# Patient Record
Sex: Female | Born: 1990 | Race: White | Hispanic: No | Smoking: Former smoker
Health system: Southern US, Community
[De-identification: ages and names within clinical notes are randomized; demographics above are authoritative.]

## PROBLEM LIST (undated history)

## (undated) DIAGNOSIS — G4730 Sleep apnea, unspecified: Secondary | ICD-10-CM

## (undated) DIAGNOSIS — N809 Endometriosis, unspecified: Secondary | ICD-10-CM

## (undated) DIAGNOSIS — Q625 Duplication of ureter: Secondary | ICD-10-CM

## (undated) DIAGNOSIS — T8859XA Other complications of anesthesia, initial encounter: Secondary | ICD-10-CM

## (undated) DIAGNOSIS — M1990 Unspecified osteoarthritis, unspecified site: Secondary | ICD-10-CM

## (undated) DIAGNOSIS — G43909 Migraine, unspecified, not intractable, without status migrainosus: Secondary | ICD-10-CM

## (undated) DIAGNOSIS — N189 Chronic kidney disease, unspecified: Secondary | ICD-10-CM

## (undated) DIAGNOSIS — K219 Gastro-esophageal reflux disease without esophagitis: Secondary | ICD-10-CM

## (undated) DIAGNOSIS — J45909 Unspecified asthma, uncomplicated: Secondary | ICD-10-CM

## (undated) DIAGNOSIS — Z87442 Personal history of urinary calculi: Secondary | ICD-10-CM

## (undated) DIAGNOSIS — D649 Anemia, unspecified: Secondary | ICD-10-CM

## (undated) DIAGNOSIS — K589 Irritable bowel syndrome without diarrhea: Secondary | ICD-10-CM

## (undated) DIAGNOSIS — G8929 Other chronic pain: Secondary | ICD-10-CM

## (undated) DIAGNOSIS — G40909 Epilepsy, unspecified, not intractable, without status epilepticus: Secondary | ICD-10-CM

## (undated) DIAGNOSIS — R7989 Other specified abnormal findings of blood chemistry: Secondary | ICD-10-CM

## (undated) DIAGNOSIS — Z9889 Other specified postprocedural states: Secondary | ICD-10-CM

## (undated) DIAGNOSIS — F32A Depression, unspecified: Secondary | ICD-10-CM

## (undated) DIAGNOSIS — R569 Unspecified convulsions: Secondary | ICD-10-CM

## (undated) DIAGNOSIS — F419 Anxiety disorder, unspecified: Secondary | ICD-10-CM

## (undated) HISTORY — PX: APPENDECTOMY: SHX54

## (undated) HISTORY — PX: COLONOSCOPY WITH ESOPHAGOGASTRODUODENOSCOPY (EGD): SHX5779

## (undated) HISTORY — PX: LAPAROSCOPIC ENDOMETRIOSIS FULGURATION: SUR769

## (undated) HISTORY — DX: Endometriosis, unspecified: N80.9

## (undated) HISTORY — DX: Duplication of ureter: Q62.5

## (undated) HISTORY — PX: HYSTEROSCOPY: SHX211

## (undated) HISTORY — DX: Other chronic pain: G89.29

## (undated) HISTORY — PX: TOTAL LAPAROSCOPIC HYSTERECTOMY WITH BILATERAL SALPINGO OOPHORECTOMY: SHX6845

---

## 2014-06-01 DIAGNOSIS — R569 Unspecified convulsions: Secondary | ICD-10-CM

## 2014-06-01 HISTORY — DX: Unspecified convulsions: R56.9

## 2014-11-07 ENCOUNTER — Encounter: Payer: Self-pay | Admitting: *Deleted

## 2014-11-07 ENCOUNTER — Emergency Department: Payer: Medicare HMO

## 2014-11-07 ENCOUNTER — Emergency Department
Admission: EM | Admit: 2014-11-07 | Discharge: 2014-11-07 | Disposition: A | Discharge status: Home / Self Care | Payer: Medicare HMO | Attending: Emergency Medicine | Admitting: Emergency Medicine

## 2014-11-07 DIAGNOSIS — N939 Abnormal uterine and vaginal bleeding, unspecified: Secondary | ICD-10-CM | POA: Diagnosis present

## 2014-11-07 DIAGNOSIS — R197 Diarrhea, unspecified: Secondary | ICD-10-CM | POA: Diagnosis not present

## 2014-11-07 DIAGNOSIS — Z3202 Encounter for pregnancy test, result negative: Secondary | ICD-10-CM | POA: Diagnosis not present

## 2014-11-07 DIAGNOSIS — Z880 Allergy status to penicillin: Secondary | ICD-10-CM | POA: Insufficient documentation

## 2014-11-07 DIAGNOSIS — R1032 Left lower quadrant pain: Secondary | ICD-10-CM

## 2014-11-07 DIAGNOSIS — N946 Dysmenorrhea, unspecified: Secondary | ICD-10-CM | POA: Diagnosis not present

## 2014-11-07 DIAGNOSIS — R1031 Right lower quadrant pain: Secondary | ICD-10-CM

## 2014-11-07 HISTORY — DX: Epilepsy, unspecified, not intractable, without status epilepticus: G40.909

## 2014-11-07 LAB — COMPREHENSIVE METABOLIC PANEL
ALK PHOS: 30 U/L — AB (ref 38–126)
ALT: 14 U/L (ref 14–54)
AST: 15 U/L (ref 15–41)
Albumin: 4.2 g/dL (ref 3.5–5.0)
Anion gap: 6 (ref 5–15)
BUN: 13 mg/dL (ref 6–20)
CALCIUM: 9.2 mg/dL (ref 8.9–10.3)
CO2: 27 mmol/L (ref 22–32)
Chloride: 107 mmol/L (ref 101–111)
Creatinine, Ser: 0.64 mg/dL (ref 0.44–1.00)
GFR calc Af Amer: >60 mL/min (ref >60)
GFR calc non Af Amer: >60 mL/min (ref >60)
Glucose, Bld: 97 mg/dL (ref 65–99)
POTASSIUM: 4.2 mmol/L (ref 3.5–5.1)
Sodium: 140 mmol/L (ref 135–145)
Total Bilirubin: 0.2 mg/dL — ABNORMAL LOW (ref 0.3–1.2)
Total Protein: 6.9 g/dL (ref 6.5–8.1)

## 2014-11-07 LAB — WET PREP, GENITAL
Clue Cells Wet Prep HPF POC: NONE SEEN
TRICH WET PREP: NONE SEEN
Yeast Wet Prep HPF POC: NONE SEEN

## 2014-11-07 LAB — CBC WITH DIFFERENTIAL/PLATELET
BASOS ABS: 0.1 10*3/uL (ref 0–0.1)
Basophils Relative: 1 %
Eosinophils Absolute: 0.1 10*3/uL (ref 0–0.7)
Eosinophils Relative: 1 %
HCT: 40.3 % (ref 35.0–47.0)
Hemoglobin: 13.4 g/dL (ref 12.0–16.0)
Lymphocytes Relative: 36 %
Lymphs Abs: 2.2 10*3/uL (ref 1.0–3.6)
MCH: 28.6 pg (ref 26.0–34.0)
MCHC: 33.2 g/dL (ref 32.0–36.0)
MCV: 86.1 fL (ref 80.0–100.0)
Monocytes Absolute: 0.4 10*3/uL (ref 0.2–0.9)
Monocytes Relative: 7 %
NEUTROS ABS: 3.4 10*3/uL (ref 1.4–6.5)
NEUTROS PCT: 55 %
Platelets: 220 10*3/uL (ref 150–440)
RBC: 4.68 MIL/uL (ref 3.80–5.20)
RDW: 13.4 % (ref 11.5–14.5)
WBC: 6.2 10*3/uL (ref 3.6–11.0)

## 2014-11-07 LAB — URINALYSIS COMPLETE WITH MICROSCOPIC (ARMC ONLY)
Bilirubin Urine: NEGATIVE
Glucose, UA: NEGATIVE mg/dL
Hgb urine dipstick: NEGATIVE
Ketones, ur: NEGATIVE mg/dL
Leukocytes, UA: NEGATIVE
NITRITE: NEGATIVE
PROTEIN: NEGATIVE mg/dL
RBC / HPF: NONE SEEN RBC/hpf (ref 0–5)
SPECIFIC GRAVITY, URINE: 1.019 (ref 1.005–1.030)
pH: 6 (ref 5.0–8.0)

## 2014-11-07 LAB — CHLAMYDIA/NGC RT PCR (ARMC ONLY)
Chlamydia Tr: NOT DETECTED
N GONORRHOEAE: NOT DETECTED

## 2014-11-07 LAB — PREGNANCY, URINE: Preg Test, Ur: NEGATIVE

## 2014-11-07 MED ORDER — HYDROCODONE-ACETAMINOPHEN 5-325 MG PO TABS
1.0000 | ORAL_TABLET | Freq: Once | ORAL | Status: AC
  Administered 2014-11-07: 1 via ORAL

## 2014-11-07 MED ORDER — KETOROLAC TROMETHAMINE 10 MG PO TABS
10.0000 mg | ORAL_TABLET | Freq: Once | ORAL | Status: AC
  Administered 2014-11-07: 10 mg via ORAL

## 2014-11-07 MED ORDER — KETOROLAC TROMETHAMINE 10 MG PO TABS
ORAL_TABLET | ORAL | Status: AC
  Administered 2014-11-07: 10 mg via ORAL
  Filled 2014-11-07: qty 1

## 2014-11-07 MED ORDER — HYDROCODONE-ACETAMINOPHEN 5-325 MG PO TABS
ORAL_TABLET | ORAL | Status: AC
  Administered 2014-11-07: 1 via ORAL
  Filled 2014-11-07: qty 1

## 2014-11-07 MED ORDER — KETOROLAC TROMETHAMINE 10 MG PO TABS: 10.0000 mg | ORAL_TABLET | Freq: Three times a day (TID) | ORAL | Status: DC | PRN

## 2014-11-07 NOTE — ED Notes (Signed)
Lord, MD at bedside. 

## 2014-11-07 NOTE — ED Provider Notes (Signed)
[facility] Emergency Department Provider Note   ____________________________________________  Time seen: 8 PM I have reviewed the triage vital signs and the triage nursing note.  HISTORY  Chief Complaint Vaginal Bleeding   Historian Patient and significant other  HPI Grace Norris is a 24 y.o. female please been having lower abdominal pain and cramping as well as dark vaginal bleeding since Saturday. She is currently taking a birth control patch and she is not due for her period until one week from now. The pain in the low abdomen is moderate. There is occasional sharp pains. She is asked really concerned about a rupturing cyst because her family member had emergency surgery for this. She has been working out recently, but doesn't feel like her pain is due to sore abscess.   Past Medical History  Diagnosis Date  . Epilepsy     There are no active problems to display for this patient.   History reviewed. No pertinent past surgical history.  Current Outpatient Rx  Name  Route  Sig  Dispense  Refill  . ketorolac (TORADOL) 10 MG tablet   Oral   Take 1 tablet (10 mg total) by mouth every 8 (eight) hours as needed for moderate pain.   15 tablet   0     Allergies Ibuprofen; Penicillins; and Oxycodone  No family history on file.  Social History History  Substance Use Topics  . Smoking status: Never Smoker   . Smokeless tobacco: Not on file  . Alcohol Use: Yes    Review of Systems  Constitutional: Negative for fever. Eyes: Negative for visual changes. ENT: Negative for sore throat. Cardiovascular: Negative for chest pain. Respiratory: Negative for shortness of breath. Gastrointestinal: Positive for diarrhea in the mornings for the past 3 days. Genitourinary: Negative for dysuria. Musculoskeletal: Negative for back pain. Skin: Negative for rash. Neurological: Negative for headaches, focal weakness or  numbness.  ____________________________________________   PHYSICAL EXAM:  VITAL SIGNS: ED Triage Vitals  Enc Vitals Group     BP 11/07/14 1945 126/95 mmHg     Pulse Rate 11/07/14 1945 72     Resp 11/07/14 1945 18     Temp 11/07/14 1945 98.1 F (36.7 C)     Temp Source 11/07/14 1945 Oral     SpO2 11/07/14 1945 100 %     Weight 11/07/14 1945 117 lb (53.071 kg)     Height 11/07/14 1945 5\' 4"  (1.626 m)     Head Cir --      Peak Flow --      Pain Score 11/07/14 1946 9     Pain Loc --      Pain Edu? --      Excl. in [place]? --      Constitutional: Alert and oriented. Well appearing and in no distress. Eyes: Conjunctivae are normal. PERRL. Normal extraocular movements. ENT   Head: Normocephalic and atraumatic.   Nose: No congestion/rhinnorhea.   Mouth/Throat: Mucous membranes are moist.   Neck: No stridor. Cardiovascular: Normal rate, regular rhythm.  No murmurs, rubs, or gallops. Respiratory: Normal respiratory effort without tachypnea nor retractions. Breath sounds are clear and equal bilaterally. No wheezes/rales/rhonchi. Gastrointestinal: Soft. No distention, no guarding, no rebound. Moderate tenderness in the suprapubic right and left lower quadrants. Mild tenderness diffusely and epigastrically.  Genitourinary/rectal: Nontender pelvic exam, with no cervicitis, mild vaginal discharge, tiny amount of dark vaginal blood, closed cervix Musculoskeletal: Nontender with normal range of motion in all extremities. No joint  effusions.  No lower extremity tenderness nor edema. Neurologic:  Normal speech and language. No gross focal neurologic deficits are appreciated. Skin:  Skin is warm, dry and intact. No rash noted. Psychiatric: Mood and affect are normal. Speech and behavior are normal. Patient exhibits appropriate insight and judgment.  ____________________________________________   EKG  None ____________________________________________  LABS (pertinent  positives/negatives)  Wet prep with few WBC but otherwise negative Your pregnancy test negative Metabolic panel within normal limits White blood cell count 6.2 and hemoglobin 13.4  Urinalysis rare bacteria 0-5 white blood cell count otherwise negative ____________________________________________  RADIOLOGY Radiologist results reviewed  Ultrasound transvaginal: Normal pelvic and transvaginal ultrasound __________________________________________  PROCEDURES  Procedure(s) performed: None Critical Care performed: None  ____________________________________________   ED COURSE / ASSESSMENT AND PLAN  Pertinent labs & imaging results that were available during my care of the patient were reviewed by me and considered in my medical decision making (see chart for details).  Patient is mostly concerned about vaginal bleeding and abdominal cramping and superpubic area. Her labs and evaluation are reassuring. There is no evidence of cervicitis or PID. She is having dysmenorrhea and irregular bleeding likely hormonal given the fact that she is having bleeding through her personal patch. I recommended her to follow up with OB/GYN.  I am not suspicious of an other medical or surgical abdominal emergency such as appendicitis clinically on exam.  Return depressions and discharge instructions were provided to patient and she is agreeable to this plan.    ___________________________________________   FINAL CLINICAL IMPRESSION(S) / ED DIAGNOSES   Final diagnoses:  Bilateral lower abdominal pain  Dysmenorrhea      Lisa Roca, MD 11/07/14 2303

## 2014-11-07 NOTE — ED Notes (Signed)
Pt reports dark red vaginal bleeding with clotting x 3 days with sharp lower abdominal pain

## 2014-11-07 NOTE — ED Notes (Signed)
Pelvic exam done.   speciman to lab

## 2014-11-07 NOTE — Discharge Instructions (Signed)
You were evaluated for irregular vaginal bleeding and abdominal cramping. Your evaluation and exam are reassuring here in the emergency department. Return to the emergency room for any new or worsening abdominal pain, fever, black or bloody stools, dizziness, passing out, vomiting, or any other symptoms concerning to you.  You are referred to follow-up with an OB/GYN doctor with regard to irregular vaginal bleeding despite being on birth control patch.   Abdominal Pain, Women Abdominal (stomach, pelvic, or belly) pain can be caused by many things. It is important to tell your doctor:  The location of the pain.  Does it come and go or is it present all the time?  Are there things that start the pain (eating certain foods, exercise)?  Are there other symptoms associated with the pain (fever, nausea, vomiting, diarrhea)? All of this is helpful to know when trying to find the cause of the pain. CAUSES   Stomach: virus or bacteria infection, or ulcer.  Intestine: appendicitis (inflamed appendix), regional ileitis (Crohn's disease), ulcerative colitis (inflamed colon), irritable bowel syndrome, diverticulitis (inflamed diverticulum of the colon), or cancer of the stomach or intestine.  Gallbladder disease or stones in the gallbladder.  Kidney disease, kidney stones, or infection.  Pancreas infection or cancer.  Fibromyalgia (pain disorder).  Diseases of the female organs:  Uterus: fibroid (non-cancerous) tumors or infection.  Fallopian tubes: infection or tubal pregnancy.  Ovary: cysts or tumors.  Pelvic adhesions (scar tissue).  Endometriosis (uterus lining tissue growing in the pelvis and on the pelvic organs).  Pelvic congestion syndrome (female organs filling up with blood just before the menstrual period).  Pain with the menstrual period.  Pain with ovulation (producing an egg).  Pain with an IUD (intrauterine device, birth control) in the uterus.  Cancer of the female  organs.  Functional pain (pain not caused by a disease, may improve without treatment).  Psychological pain.  Depression. DIAGNOSIS  Your doctor will decide the seriousness of your pain by doing an examination.  Blood tests.  X-rays.  Ultrasound.  CT scan (computed tomography, special type of X-ray).  MRI (magnetic resonance imaging).  Cultures, for infection.  Barium enema (dye inserted in the large intestine, to better view it with X-rays).  Colonoscopy (looking in intestine with a lighted tube).  Laparoscopy (minor surgery, looking in abdomen with a lighted tube).  Major abdominal exploratory surgery (looking in abdomen with a large incision). TREATMENT  The treatment will depend on the cause of the pain.   Many cases can be observed and treated at home.  Over-the-counter medicines recommended by your caregiver.  Prescription medicine.  Antibiotics, for infection.  Birth control pills, for painful periods or for ovulation pain.  Hormone treatment, for endometriosis.  Nerve blocking injections.  Physical therapy.  Antidepressants.  Counseling with a psychologist or psychiatrist.  Minor or major surgery. HOME CARE INSTRUCTIONS   Do not take laxatives, unless directed by your caregiver.  Take over-the-counter pain medicine only if ordered by your caregiver. Do not take aspirin because it can cause an upset stomach or bleeding.  Try a clear liquid diet (broth or water) as ordered by your caregiver. Slowly move to a bland diet, as tolerated, if the pain is related to the stomach or intestine.  Have a thermometer and take your temperature several times a day, and record it.  Bed rest and sleep, if it helps the pain.  Avoid sexual intercourse, if it causes pain.  Avoid stressful situations.  Keep your follow-up appointments  and tests, as your caregiver orders.  If the pain does not go away with medicine or surgery, you may  try:  Acupuncture.  Relaxation exercises (yoga, meditation).  Group therapy.  Counseling. SEEK MEDICAL CARE IF:   You notice certain foods cause stomach pain.  Your home care treatment is not helping your pain.  You need stronger pain medicine.  You want your IUD removed.  You feel faint or lightheaded.  You develop nausea and vomiting.  You develop a rash.  You are having side effects or an allergy to your medicine. SEEK IMMEDIATE MEDICAL CARE IF:   Your pain does not go away or gets worse.  You have a fever.  Your pain is felt only in portions of the abdomen. The right side could possibly be appendicitis. The left lower portion of the abdomen could be colitis or diverticulitis.  You are passing blood in your stools (bright red or black tarry stools, with or without vomiting).  You have blood in your urine.  You develop chills, with or without a fever.  You pass out. MAKE SURE YOU:  Dysmenorrhea Dysmenorrhea is pain during a menstrual period. You will have pain in the lower belly (abdomen). The pain is caused by the tightening (contracting) of the muscles of the uterus. The pain can be minor or severe. Headache, feeling sick to your stomach (nausea), throwing up (vomiting), or low back pain may occur with this condition. HOME CARE  Only take medicine as told by your doctor.  Place a heating pad or hot water bottle on your lower back or belly. Do not sleep with a heating pad.  Exercise may help lessen the pain.  Massage the lower back or belly.  Stop smoking.  Avoid alcohol and caffeine. GET HELP IF:   Your pain does not get better with medicine.  You have pain during sex.  Your pain gets worse while taking pain medicine.  Your period bleeding is heavier than normal.  You keep feeling sick to your stomach or keep throwing up. GET HELP RIGHT AWAY IF: You pass out (faint). Document Released: 08/14/2008 Document Revised: 05/23/2013 Document Reviewed:  11/03/2012 [facility] Patient Information 2015 [place], [place]. This information is not intended to replace advice given to you by your health care provider. Make sure you discuss any questions you have with your health care provider.   Understand these instructions.  Will watch your condition.  Will get help right away if you are not doing well or get worse. Document Released: 03/15/2007 Document Revised: 10/02/2013 Document Reviewed: 04/04/2009 [facility] Patient Information 2015 [place], [place]. This information is not intended to replace advice given to you by your health care provider. Make sure you discuss any questions you have with your health care provider.

## 2015-06-05 ENCOUNTER — Encounter: Payer: Self-pay | Admitting: Emergency Medicine

## 2015-06-05 ENCOUNTER — Emergency Department
Admission: EM | Admit: 2015-06-05 | Discharge: 2015-06-05 | Discharge status: Against Medical Advice | Payer: Medicare Other | Attending: Emergency Medicine | Admitting: Emergency Medicine

## 2015-06-05 DIAGNOSIS — R1010 Upper abdominal pain, unspecified: Secondary | ICD-10-CM | POA: Insufficient documentation

## 2015-06-05 DIAGNOSIS — R1110 Vomiting, unspecified: Secondary | ICD-10-CM | POA: Insufficient documentation

## 2015-06-05 LAB — CBC
HEMATOCRIT: 42.7 % (ref 35.0–47.0)
HEMOGLOBIN: 14.5 g/dL (ref 12.0–16.0)
MCH: 29 pg (ref 26.0–34.0)
MCHC: 33.9 g/dL (ref 32.0–36.0)
MCV: 85.6 fL (ref 80.0–100.0)
Platelets: 288 10*3/uL (ref 150–440)
RBC: 4.99 MIL/uL (ref 3.80–5.20)
RDW: 12.8 % (ref 11.5–14.5)
WBC: 6.1 10*3/uL (ref 3.6–11.0)

## 2015-06-05 LAB — URINALYSIS COMPLETE WITH MICROSCOPIC (ARMC ONLY)
Bilirubin Urine: NEGATIVE
Glucose, UA: NEGATIVE mg/dL
Ketones, ur: NEGATIVE mg/dL
LEUKOCYTES UA: NEGATIVE
NITRITE: NEGATIVE
PROTEIN: NEGATIVE mg/dL
RBC / HPF: NONE SEEN RBC/hpf (ref 0–5)
Specific Gravity, Urine: 1.008 (ref 1.005–1.030)
pH: 6 (ref 5.0–8.0)

## 2015-06-05 LAB — COMPREHENSIVE METABOLIC PANEL
ALT: 18 U/L (ref 14–54)
ANION GAP: 3 — AB (ref 5–15)
AST: 16 U/L (ref 15–41)
Albumin: 4.6 g/dL (ref 3.5–5.0)
Alkaline Phosphatase: 44 U/L (ref 38–126)
BILIRUBIN TOTAL: 0.8 mg/dL (ref 0.3–1.2)
BUN: 12 mg/dL (ref 6–20)
CHLORIDE: 106 mmol/L (ref 101–111)
CO2: 29 mmol/L (ref 22–32)
Calcium: 9.1 mg/dL (ref 8.9–10.3)
Creatinine, Ser: 0.6 mg/dL (ref 0.44–1.00)
GFR calc Af Amer: >60 mL/min (ref >60)
Glucose, Bld: 96 mg/dL (ref 65–99)
Potassium: 4 mmol/L (ref 3.5–5.1)
Sodium: 138 mmol/L (ref 135–145)
TOTAL PROTEIN: 7.8 g/dL (ref 6.5–8.1)

## 2015-06-05 LAB — LIPASE, BLOOD: LIPASE: 31 U/L (ref 11–51)

## 2015-06-05 NOTE — ED Notes (Signed)
Pt reports vomiting x2 days, states "too many to count"; pt also reports upper abdominal pain but states "I'm also on my period so that's probably why". Pt alert and oriented in triage room, no distress noted at this time.

## 2015-06-10 ENCOUNTER — Encounter: Payer: Self-pay | Admitting: *Deleted

## 2015-06-10 ENCOUNTER — Emergency Department
Admission: EM | Admit: 2015-06-10 | Discharge: 2015-06-10 | Disposition: A | Discharge status: Home / Self Care | Payer: Medicare Other | Attending: Emergency Medicine | Admitting: Emergency Medicine

## 2015-06-10 DIAGNOSIS — G43001 Migraine without aura, not intractable, with status migrainosus: Secondary | ICD-10-CM

## 2015-06-10 DIAGNOSIS — Z79899 Other long term (current) drug therapy: Secondary | ICD-10-CM | POA: Insufficient documentation

## 2015-06-10 DIAGNOSIS — Z880 Allergy status to penicillin: Secondary | ICD-10-CM | POA: Diagnosis not present

## 2015-06-10 DIAGNOSIS — G43909 Migraine, unspecified, not intractable, without status migrainosus: Secondary | ICD-10-CM | POA: Diagnosis present

## 2015-06-10 HISTORY — DX: Migraine, unspecified, not intractable, without status migrainosus: G43.909

## 2015-06-10 MED ORDER — PROMETHAZINE HCL 25 MG/ML IJ SOLN
25.0000 mg | Freq: Once | INTRAMUSCULAR | Status: AC
  Administered 2015-06-10: 25 mg via INTRAMUSCULAR
  Filled 2015-06-10: qty 1

## 2015-06-10 MED ORDER — KETOROLAC TROMETHAMINE 10 MG PO TABS: 10.0000 mg | ORAL_TABLET | Freq: Four times a day (QID) | ORAL | Status: DC | PRN

## 2015-06-10 MED ORDER — PROMETHAZINE HCL 12.5 MG PO TABS: 25.0000 mg | ORAL_TABLET | Freq: Three times a day (TID) | ORAL | Status: DC | PRN

## 2015-06-10 MED ORDER — DIPHENHYDRAMINE HCL 50 MG/ML IJ SOLN
50.0000 mg | Freq: Once | INTRAMUSCULAR | Status: AC
  Administered 2015-06-10: 50 mg via INTRAMUSCULAR
  Filled 2015-06-10: qty 1

## 2015-06-10 MED ORDER — KETOROLAC TROMETHAMINE 60 MG/2ML IM SOLN
60.0000 mg | Freq: Once | INTRAMUSCULAR | Status: AC
  Administered 2015-06-10: 60 mg via INTRAMUSCULAR
  Filled 2015-06-10: qty 2

## 2015-06-10 NOTE — ED Notes (Signed)
States she developed a headache at temporal area for 3 days   Usually takes excedrin. Denies any fever  N/v or photosensitivity

## 2015-06-10 NOTE — ED Provider Notes (Signed)
[facility] Emergency Department Provider Note ?  ? ____________________________________________ ? Time seen: 3:27 PM ? I have reviewed the triage vital signs and the nursing notes.  ________ HISTORY ? Chief Complaint Migraine     HPI  Grace Norris is a 25 y.o. female   who presents emergency department complaining of a migraine headache. Per the patient she has a history of migraines and seizures. She is currently taking Keppra for seizures but is tapering down on her daily dosing. She states that she normally takes Excedrin for her migraines but that this migraine has not responded to same. Patient states that pain is constant, severe. She denies any photophobia, she denies this being the worst headache of her life. She denies any nausea or vomiting. Patient states that she has had the migraine cocktail before with very good relief. ? ? ? Past Medical History  Diagnosis Date  . Epilepsy ([place])   . Migraines     There are no active problems to display for this patient.  ? History reviewed. No pertinent past surgical history. ? Current Outpatient Rx  Name  Route  Sig  Dispense  Refill  . levETIRAcetam (KEPPRA) 100 MG/ML solution   Oral   Take 500 mg by mouth 2 (two) times daily.         Marland Kitchen ketorolac (TORADOL) 10 MG tablet   Oral   Take 1 tablet (10 mg total) by mouth every 8 (eight) hours as needed for moderate pain.   15 tablet   0   . ketorolac (TORADOL) 10 MG tablet   Oral   Take 1 tablet (10 mg total) by mouth every 6 (six) hours as needed.   20 tablet   0   . promethazine (PHENERGAN) 12.5 MG tablet   Oral   Take 2 tablets (25 mg total) by mouth every 8 (eight) hours as needed (Headache).   30 tablet   0    ? Allergies Ibuprofen; Penicillins; and Oxycodone ? No family history on file. ? Social History Social History  Substance Use Topics  . Smoking status: Never Smoker   . Smokeless tobacco: None  . Alcohol Use: Yes    ? Review of Systems Constitutional: no fever. Eyes: no discharge ENT: no sore throat. Cardiovascular: no chest pain. Respiratory: no cough. No sob Gastrointestinal: denies abdominal pain, vomiting, diarrhea, and constipation Genitourinary: no dysuria. Negative for hematuria Musculoskeletal: Negative for back pain. Skin: Negative for rash. Neurological: Positive for headaches but negative for focal neurological deficits.  10-point ROS otherwise negative.  _______________ PHYSICAL EXAM: ? VITAL SIGNS:   ED Triage Vitals  Enc Vitals Group     BP 06/10/15 1219 151/77 mmHg     Pulse Rate 06/10/15 1219 96     Resp 06/10/15 1219 20     Temp 06/10/15 1219 98.2 F (36.8 C)     Temp Source 06/10/15 1219 Oral     SpO2 06/10/15 1219 99 %     Weight 06/10/15 1219 128 lb (58.06 kg)     Height 06/10/15 1219 5\' 4"  (1.626 m)     Head Cir --      Peak Flow --      Pain Score 06/10/15 1219 10     Pain Loc --      Pain Edu? --      Excl. in [place]? --    ?  Constitutional: Alert and oriented. Well appearing and in no distress. Eyes: Conjunctivae are normal.  EOMI. PERRLA. ENT      Head: Normocephalic and atraumatic.      Ears:       Nose: No congestion/rhinnorhea.      Mouth/Throat: Mucous membranes are moist.   Hematological/Lymphatic/Immunilogical: No cervical lymphadenopathy. Cardiovascular: Normal rate, regular rhythm. Normal S1 and S2. Respiratory: Normal respiratory effort without tachypnea nor retractions. Lungs CTAB. Gastrointestinal: Soft and nontender. No distention. There is no CVA tenderness. Genitourinary:  Musculoskeletal: Nontender with normal range of motion in all extremities.  Neurologic:  Normal speech and language. No gross focal neurologic deficits are appreciated. Cranial nerves II through XII grossly intact. Sensation is is in and equal all 4 extremities. Skin:  Skin is warm, dry and intact. No rash noted. Psychiatric: Mood and affect are normal. Speech and  behavior are normal. Patient exhibits appropriate insight and judgment.    LABS (all labs ordered are listed, but only abnormal results are displayed)  Labs Reviewed - No data to display  ___________ RADIOLOGY    _____________ PROCEDURES ? Procedure(s) performed:    Medications  diphenhydrAMINE (BENADRYL) injection 50 mg (not administered)  ketorolac (TORADOL) injection 60 mg (not administered)  promethazine (PHENERGAN) injection 25 mg (not administered)    ______________________________________________________ INITIAL IMPRESSION / ASSESSMENT AND PLAN / ED COURSE ? Pertinent labs & imaging results that were available during my care of the patient were reviewed by me and considered in my medical decision making (see chart for details).    Diagnosis is consistent with migraine headache. Patient does not have any changes from baseline migraines. Neuro exam is unremarkable. Patient is given Toradol, diphenhydramine, and Phenergan here in the emergency department with some relief of migraine. Patient will be discharged home with Toradol tablets, Phenergan tablets and instructed to take diphenhydramine at home for return of headache. Patient verbalizes understanding of same. She will follow up with primary care.     New Prescriptions   KETOROLAC (TORADOL) 10 MG TABLET    Take 1 tablet (10 mg total) by mouth every 6 (six) hours as needed.   PROMETHAZINE (PHENERGAN) 12.5 MG TABLET    Take 2 tablets (25 mg total) by mouth every 8 (eight) hours as needed (Headache).   ____________________________________________ FINAL CLINICAL IMPRESSION(S) / ED DIAGNOSES?  Final diagnoses:  Migraine without aura and with status migrainosus, not intractable            Darletta Moll, PA-C 06/10/15 [place], MD 06/10/15 [phone]

## 2015-06-10 NOTE — Discharge Instructions (Signed)
Recurrent Migraine Headache A migraine headache is an intense, throbbing pain on one or both sides of your head. Recurrent migraines keep coming back. A migraine can last for 30 minutes to several hours. CAUSES  The exact cause of a migraine headache is not always known. However, a migraine may be caused when nerves in the brain become irritated and release chemicals that cause inflammation. This causes pain. Certain things may also trigger migraines, such as:   Alcohol.  Smoking.  Stress.  Menstruation.  Aged cheeses.  Foods or drinks that contain nitrates, glutamate, aspartame, or tyramine.  Lack of sleep.  Chocolate.  Caffeine.  Hunger.  Physical exertion.  Fatigue.  Medicines used to treat chest pain (nitroglycerine), birth control pills, estrogen, and some blood pressure medicines. SYMPTOMS   Pain on one or both sides of your head.  Pulsating or throbbing pain.  Severe pain that prevents daily activities.  Pain that is aggravated by any physical activity.  Nausea, vomiting, or both.  Dizziness.  Pain with exposure to bright lights, loud noises, or activity.  General sensitivity to bright lights, loud noises, or smells. Before you get a migraine, you may get warning signs that a migraine is coming (aura). An aura may include:  Seeing flashing lights.  Seeing bright spots, halos, or zigzag lines.  Having tunnel vision or blurred vision.  Having feelings of numbness or tingling.  Having trouble talking.  Having muscle weakness. DIAGNOSIS  A recurrent migraine headache is often diagnosed based on:  Symptoms.  Physical examination.  A CT scan or MRI of your head. These imaging tests cannot diagnose migraines but can help rule out other causes of headaches.  TREATMENT  Medicines may be given for pain and nausea. Medicines can also be given to help prevent recurrent migraines. HOME CARE INSTRUCTIONS  Only take over-the-counter or prescription  medicines for pain or discomfort as directed by your health care provider. The use of long-term narcotics is not recommended.  Lie down in a dark, quiet room when you have a migraine.  Keep a journal to find out what may trigger your migraine headaches. For example, write down:  What you eat and drink.  How much sleep you get.  Any change to your diet or medicines.  Limit alcohol consumption.  Quit smoking if you smoke.  Get 7-9 hours of sleep, or as recommended by your health care provider.  Limit stress.  Keep lights dim if bright lights bother you and make your migraines worse. SEEK MEDICAL CARE IF:   You do not get relief from the medicines given to you.  You have a recurrence of pain.  You have a fever. SEEK IMMEDIATE MEDICAL CARE IF:  Your migraine becomes severe.  You have a stiff neck.  You have loss of vision.  You have muscular weakness or loss of muscle control.  You start losing your balance or have trouble walking.  You feel faint or pass out.  You have severe symptoms that are different from your first symptoms. MAKE SURE YOU:   Understand these instructions.  Will watch your condition.  Will get help right away if you are not doing well or get worse.   This information is not intended to replace advice given to you by your health care provider. Make sure you discuss any questions you have with your health care provider.   Document Released: 02/10/2001 Document Revised: 06/08/2014 Document Reviewed: 01/23/2013 Elsevier Interactive Patient Education 2016 [employer]  

## 2015-06-10 NOTE — ED Notes (Signed)
Patient states she has had a migraine for 3 days. Patient states she took and Excedrin and hot cloths without relief. Patient is calm, texting on phone in triage.

## 2015-06-25 ENCOUNTER — Emergency Department
Admission: EM | Admit: 2015-06-25 | Discharge: 2015-06-25 | Disposition: A | Discharge status: Home / Self Care | Payer: Medicare Other | Attending: Student | Admitting: Student

## 2015-06-25 ENCOUNTER — Encounter: Payer: Self-pay | Admitting: Emergency Medicine

## 2015-06-25 DIAGNOSIS — Z880 Allergy status to penicillin: Secondary | ICD-10-CM | POA: Insufficient documentation

## 2015-06-25 DIAGNOSIS — M25531 Pain in right wrist: Secondary | ICD-10-CM | POA: Diagnosis present

## 2015-06-25 DIAGNOSIS — Z79899 Other long term (current) drug therapy: Secondary | ICD-10-CM | POA: Diagnosis not present

## 2015-06-25 NOTE — ED Provider Notes (Signed)
[facility] Emergency Department Provider Note  ____________________________________________  Time seen: Approximately 7:54 PM  I have reviewed the triage vital signs and the nursing notes.   HISTORY  Chief Complaint Wrist Pain    HPI Grace Norris is a 25 y.o. female who presents emergency department complaining of right wrist pain. Patient states that she has not had any injury to wrist. She went to see her primary care provider and was told that she had a old fracture of her right wrist that had not healed properly. Her primary care provider told her that there was some malalignment of the bone. No new fractures. Patient was given splint and prescription for oral Toradol and was told to follow up with "someone who can put a cast on it."Patient presents emergency department seeking a cast for her wrist.   Past Medical History  Diagnosis Date  . Epilepsy ([place])   . Migraines     There are no active problems to display for this patient.   History reviewed. No pertinent past surgical history.  Current Outpatient Rx  Name  Route  Sig  Dispense  Refill  . ketorolac (TORADOL) 10 MG tablet   Oral   Take 1 tablet (10 mg total) by mouth every 8 (eight) hours as needed for moderate pain.   15 tablet   0   . ketorolac (TORADOL) 10 MG tablet   Oral   Take 1 tablet (10 mg total) by mouth every 6 (six) hours as needed.   20 tablet   0   . levETIRAcetam (KEPPRA) 100 MG/ML solution   Oral   Take 500 mg by mouth 2 (two) times daily.         . promethazine (PHENERGAN) 12.5 MG tablet   Oral   Take 2 tablets (25 mg total) by mouth every 8 (eight) hours as needed (Headache).   30 tablet   0     Allergies Ibuprofen; Penicillins; and Oxycodone  History reviewed. No pertinent family history.  Social History Social History  Substance Use Topics  . Smoking status: Never Smoker   . Smokeless tobacco: None  . Alcohol Use: Yes     Review of  Systems  Constitutional: No fever/chills Eyes: No visual changes. No discharge ENT: No sore throat. Cardiovascular: no chest pain. Respiratory: no cough. No SOB. Gastrointestinal: No abdominal pain.  No nausea, no vomiting.  No diarrhea.  No constipation. Genitourinary: Negative for dysuria. No hematuria Musculoskeletal: Negative for back pain. Right wrist pain. Skin: Negative for rash. Neurological: Negative for headaches, focal weakness or numbness. 10-point ROS otherwise negative.  ____________________________________________   PHYSICAL EXAM:  VITAL SIGNS: ED Triage Vitals  Enc Vitals Group     BP --      Pulse Rate 06/25/15 1935 86     Resp 06/25/15 1935 18     Temp 06/25/15 1935 97.9 F (36.6 C)     Temp src --      SpO2 06/25/15 1935 100 %     Weight --      Height --      Head Cir --      Peak Flow --      Pain Score --      Pain Loc --      Pain Edu? --      Excl. in [place]? --      Constitutional: Alert and oriented. Well appearing and in no acute distress. Eyes: Conjunctivae are normal. PERRL. EOMI.  Head: Atraumatic. ENT:      Ears:       Nose: No congestion/rhinnorhea.      Mouth/Throat: Mucous membranes are moist.  Neck: No stridor.   Hematological/Lymphatic/Immunilogical: No cervical lymphadenopathy. Cardiovascular: Normal rate, regular rhythm. Normal S1 and S2.  Good peripheral circulation. Respiratory: Normal respiratory effort without tachypnea or retractions. Lungs CTAB. Gastrointestinal: Soft and nontender. No distention. No CVA tenderness. Musculoskeletal: No lower extremity tenderness nor edema.  No joint effusions. Right wrist is in a Velcro wrist splint. This is removed. No visible deformity. Patient endorses tenderness to palpation over the distal radius and ulna. No palpable abnormality. Full range of motion to wrist. Radial pulse intact. Sensation intact 5 digits and equal to unaffected extremity. Neurologic:  Normal speech and language. No  gross focal neurologic deficits are appreciated.  Skin:  Skin is warm, dry and intact. No rash noted. Psychiatric: Mood and affect are normal. Speech and behavior are normal. Patient exhibits appropriate insight and judgement.   ____________________________________________   LABS (all labs ordered are listed, but only abnormal results are displayed)  Labs Reviewed - No data to display ____________________________________________  EKG   ____________________________________________  RADIOLOGY   No results found.  ____________________________________________    PROCEDURES  Procedure(s) performed:       Medications - No data to display   ____________________________________________   INITIAL IMPRESSION / ASSESSMENT AND PLAN / ED COURSE  Pertinent labs & imaging results that were available during my care of the patient were reviewed by me and considered in my medical decision making (see chart for details).  Patient's diagnosis is consistent with right wrist pain. Patient was seen by primary care provider and told that she needed to see someone to have a cast on her wrist. She denies any new fractures. She states that there is "malalignment from previous fracture." This fracture occurred 20 years prior. Patient is in a Velcro wrist splint from primary care provider and has Toradol tablets prescribed for her persistent control. Wrist is not re-x-rayed at this time. Patient is requesting sling and this is provided for her. She is to follow-up with orthopedics for further evaluation and treatment    ____________________________________________  FINAL CLINICAL IMPRESSION(S) / ED DIAGNOSES  Final diagnoses:  Right wrist pain      NEW MEDICATIONS STARTED DURING THIS VISIT:  New Prescriptions   No medications on file        Darletta Moll, PA-C 06/25/15 2003  Joanne Gavel, MD 06/25/15 [phone]

## 2015-06-25 NOTE — ED Notes (Signed)
Pt c/o right wrist pain, r/t fracture over 20 years ago. Pt reports going to PCP for xray, pt told old fracture never healed and had scar tissue, pt given splint and Toradol for pain management.

## 2015-06-25 NOTE — Discharge Instructions (Signed)
Wrist Pain There are many things that can cause wrist pain. Some common causes include:  An injury to the wrist area, such as a sprain, strain, or fracture.  Overuse of the joint.  A condition that causes increased pressure on a nerve in the wrist (carpal tunnel syndrome).  Wear and tear of the joints that occurs with aging (osteoarthritis).  A variety of other types of arthritis. Sometimes, the cause of wrist pain is not known. The pain often goes away when you follow your health care provider's instructions for relieving pain at home. If your wrist pain continues, tests may need to be done to diagnose your condition. HOME CARE INSTRUCTIONS Pay attention to any changes in your symptoms. Take these actions to help with your pain:  Rest the wrist area for at least 48 hours or as told by your health care provider.  If directed, apply ice to the injured area:  Put ice in a plastic bag.  Place a towel between your skin and the bag.  Leave the ice on for 20 minutes, 2-3 times per day.  Keep your arm raised (elevated) above the level of your heart while you are sitting or lying down.  If a splint or elastic bandage has been applied, use it as told by your health care provider.  Remove the splint or bandage only as told by your health care provider.  Loosen the splint or bandage if your fingers become numb or have a tingling feeling, or if they turn cold or blue.  Take over-the-counter and prescription medicines only as told by your health care provider.  Keep all follow-up visits as told by your health care provider. This is important. SEEK MEDICAL CARE IF:  Your pain is not helped by treatment.  Your pain gets worse. SEEK IMMEDIATE MEDICAL CARE IF:  Your fingers become swollen.  Your fingers turn white, very red, or cold and blue.  Your fingers are numb or have a tingling feeling.  You have difficulty moving your fingers.   This information is not intended to replace  advice given to you by your health care provider. Make sure you discuss any questions you have with your health care provider.   Document Released: 02/25/2005 Document Revised: 02/06/2015 Document Reviewed: 10/03/2014 Elsevier Interactive Patient Education 2016 [employer]  

## 2015-07-24 ENCOUNTER — Emergency Department
Admission: EM | Admit: 2015-07-24 | Discharge: 2015-07-24 | Disposition: A | Discharge status: Home / Self Care | Payer: Medicare Other | Attending: Emergency Medicine | Admitting: Emergency Medicine

## 2015-07-24 ENCOUNTER — Emergency Department: Payer: Medicare Other

## 2015-07-24 DIAGNOSIS — Z880 Allergy status to penicillin: Secondary | ICD-10-CM | POA: Diagnosis not present

## 2015-07-24 DIAGNOSIS — Z79899 Other long term (current) drug therapy: Secondary | ICD-10-CM | POA: Diagnosis not present

## 2015-07-24 DIAGNOSIS — R079 Chest pain, unspecified: Secondary | ICD-10-CM | POA: Diagnosis not present

## 2015-07-24 LAB — TROPONIN I: Troponin I: <0.03 ng/mL (ref <0.031)

## 2015-07-24 LAB — BASIC METABOLIC PANEL
ANION GAP: 8 (ref 5–15)
BUN: 15 mg/dL (ref 6–20)
CO2: 25 mmol/L (ref 22–32)
Calcium: 9.1 mg/dL (ref 8.9–10.3)
Chloride: 105 mmol/L (ref 101–111)
Creatinine, Ser: 0.5 mg/dL (ref 0.44–1.00)
GFR calc Af Amer: >60 mL/min (ref >60)
GLUCOSE: 90 mg/dL (ref 65–99)
POTASSIUM: 3.9 mmol/L (ref 3.5–5.1)
Sodium: 138 mmol/L (ref 135–145)

## 2015-07-24 LAB — CBC
HEMATOCRIT: 40.3 % (ref 35.0–47.0)
HEMOGLOBIN: 13.5 g/dL (ref 12.0–16.0)
MCH: 28.3 pg (ref 26.0–34.0)
MCHC: 33.5 g/dL (ref 32.0–36.0)
MCV: 84.4 fL (ref 80.0–100.0)
Platelets: 184 10*3/uL (ref 150–440)
RBC: 4.77 MIL/uL (ref 3.80–5.20)
RDW: 13.3 % (ref 11.5–14.5)
WBC: 5.1 10*3/uL (ref 3.6–11.0)

## 2015-07-24 MED ORDER — ACETAMINOPHEN 325 MG PO TABS
650.0000 mg | ORAL_TABLET | Freq: Once | ORAL | Status: AC
  Administered 2015-07-24: 650 mg via ORAL
  Filled 2015-07-24: qty 2

## 2015-07-24 NOTE — ED Provider Notes (Signed)
[facility] Emergency Department Provider Note    ____________________________________________  Time seen: ~1855  I have reviewed the triage vital signs and the nursing notes.   HISTORY  Chief Complaint Chest Pain   History limited by: Not Limited   HPI Grace Norris is a 25 y.o. female who presents to the emergency department today because of concerns for chest pain. The patient states that she started having chest pain this morning. It is located in the central chest. It is been sharp. It has been constant since roughly 10 AM. She states that it is worse with movement. She states that she was recently started treatment for the flu. States she was tested at a [facility] and put on Tamiflu. Patient denies any cough with the chest pain. Denies any shortness of breath although states that it is hard for her to take a deep breath without pain.     Past Medical History  Diagnosis Date  . Epilepsy ([place])   . Migraines     There are no active problems to display for this patient.   History reviewed. No pertinent past surgical history.  Current Outpatient Rx  Name  Route  Sig  Dispense  Refill  . ketorolac (TORADOL) 10 MG tablet   Oral   Take 1 tablet (10 mg total) by mouth every 8 (eight) hours as needed for moderate pain.   15 tablet   0   . ketorolac (TORADOL) 10 MG tablet   Oral   Take 1 tablet (10 mg total) by mouth every 6 (six) hours as needed.   20 tablet   0   . levETIRAcetam (KEPPRA) 100 MG/ML solution   Oral   Take 500 mg by mouth 2 (two) times daily.         . promethazine (PHENERGAN) 12.5 MG tablet   Oral   Take 2 tablets (25 mg total) by mouth every 8 (eight) hours as needed (Headache).   30 tablet   0     Allergies Ibuprofen; Penicillins; and Oxycodone  No family history on file.  Social History Social History  Substance Use Topics  . Smoking status: Never Smoker   . Smokeless tobacco: None  . Alcohol Use: Yes     Review of Systems  Constitutional: Negative for fever. Cardiovascular: Positive for chest pain. Respiratory: Negative for shortness of breath. Gastrointestinal: Negative for abdominal pain, vomiting and diarrhea. Neurological: Negative for headaches, focal weakness or numbness.   10-point ROS otherwise negative.  ____________________________________________   PHYSICAL EXAM:  VITAL SIGNS: ED Triage Vitals  Enc Vitals Group     BP 07/24/15 1703 137/75 mmHg     Pulse Rate 07/24/15 1703 95     Resp 07/24/15 1703 20     Temp 07/24/15 1703 98.3 F (36.8 C)     Temp Source 07/24/15 1703 Oral     SpO2 07/24/15 1703 100 %     Weight 07/24/15 1703 134 lb (60.782 kg)     Height 07/24/15 1703 5\' 4"  (1.626 m)     Head Cir --      Peak Flow --      Pain Score 07/24/15 1657 10   Constitutional: Alert and oriented. Well appearing and in no distress. Eyes: Conjunctivae are normal. PERRL. Normal extraocular movements. ENT   Head: Normocephalic and atraumatic.   Nose: No congestion/rhinnorhea.   Mouth/Throat: Mucous membranes are moist.   Neck: No stridor. Hematological/Lymphatic/Immunilogical: No cervical lymphadenopathy. Cardiovascular: Normal rate, regular  rhythm.  No murmurs, rubs, or gallops. Respiratory: Normal respiratory effort without tachypnea nor retractions. Breath sounds are clear and equal bilaterally. No wheezes/rales/rhonchi. Central chest is tender to palpation. Patient states this does reproduce the pain she has been experiencing. Gastrointestinal: Soft and nontender. No distention.  Genitourinary: Deferred Musculoskeletal: Normal range of motion in all extremities. No joint effusions.  No lower extremity tenderness nor edema. Neurologic:  Normal speech and language. No gross focal neurologic deficits are appreciated.  Skin:  Skin is warm, dry and intact. No rash noted. Psychiatric: Mood and affect are normal. Speech and behavior are normal. Patient  exhibits appropriate insight and judgment.  ____________________________________________    LABS (pertinent positives/negatives)  Labs Reviewed  BASIC METABOLIC PANEL  CBC  TROPONIN I     ____________________________________________   EKG  I, Nance Pear, attending physician, personally viewed and interpreted this EKG  EKG Time: 1700 Rate: 89 Rhythm: normal sinus rhythm Axis: normal Intervals: qtc 413 QRS: narrow ST changes: no st elevation Impression: abnormal ekg  ____________________________________________    RADIOLOGY  CXR IMPRESSION: No active cardiopulmonary disease.  ____________________________________________   PROCEDURES  Procedure(s) performed: None  Critical Care performed: No  ____________________________________________   INITIAL IMPRESSION / ASSESSMENT AND PLAN / ED COURSE  Pertinent labs & imaging results that were available during my care of the patient were reviewed by me and considered in my medical decision making (see chart for details).  Patient presents to the emergency department today because of chest pain. Patient is extremely tender to palpation across the sternum. Think likely patient suffering from costochondritis likely secondary to viral illness. Chest x-ray and blood work as well as EKG without any concerning findings. Advised patient to try Tylenol and ibuprofen.  ____________________________________________   FINAL CLINICAL IMPRESSION(S) / ED DIAGNOSES  Final diagnoses:  Chest pain, unspecified chest pain type     Nance Pear, MD 07/24/15 [id number]

## 2015-07-24 NOTE — Discharge Instructions (Signed)
Please seek medical attention for any high fevers, chest pain, shortness of breath, change in behavior, persistent vomiting, bloody stool or any other new or concerning symptoms.   Chest Wall Pain Chest wall pain is pain in or around the bones and muscles of your chest. Sometimes, an injury causes this pain. Sometimes, the cause may not be known. This pain may take several weeks or longer to get better. HOME CARE Pay attention to any changes in your symptoms. Take these actions to help with your pain:  Rest as told by your doctor.  Avoid activities that cause pain. Try not to use your chest, belly (abdominal), or side muscles to lift heavy things.  If directed, apply ice to the painful area:  Put ice in a plastic bag.  Place a towel between your skin and the bag.  Leave the ice on for 20 minutes, 2-3 times per day.  Take over-the-counter and prescription medicines only as told by your doctor.  Do not use tobacco products, including cigarettes, chewing tobacco, and e-cigarettes. If you need help quitting, ask your doctor.  Keep all follow-up visits as told by your doctor. This is important. GET HELP IF:  You have a fever.  Your chest pain gets worse.  You have new symptoms. GET HELP RIGHT AWAY IF:  You feel sick to your stomach (nauseous) or you throw up (vomit).  You feel sweaty or light-headed.  You have a cough with phlegm (sputum) or you cough up blood.  You are short of breath.   This information is not intended to replace advice given to you by your health care provider. Make sure you discuss any questions you have with your health care provider.   Document Released: 11/04/2007 Document Revised: 02/06/2015 Document Reviewed: 08/13/2014 Elsevier Interactive Patient Education [employer].

## 2015-07-24 NOTE — ED Notes (Signed)
Pt arrives to Er via POV c/o chest pain that awoke her at 330AM today. Pt dx with flu X3 days ago, pt taking Tamiflu. Pt alert and oriented X4, active, cooperative, pt in NAD. RR even and unlabored, color WNL.

## 2015-08-24 ENCOUNTER — Emergency Department: Payer: Medicare Other

## 2015-08-24 ENCOUNTER — Emergency Department
Admission: EM | Admit: 2015-08-24 | Discharge: 2015-08-25 | Disposition: A | Discharge status: Home / Self Care | Payer: Medicare Other | Attending: Emergency Medicine | Admitting: Emergency Medicine

## 2015-08-24 ENCOUNTER — Encounter: Payer: Self-pay | Admitting: Emergency Medicine

## 2015-08-24 DIAGNOSIS — Z3202 Encounter for pregnancy test, result negative: Secondary | ICD-10-CM | POA: Diagnosis not present

## 2015-08-24 DIAGNOSIS — R102 Pelvic and perineal pain: Secondary | ICD-10-CM | POA: Insufficient documentation

## 2015-08-24 DIAGNOSIS — Z79899 Other long term (current) drug therapy: Secondary | ICD-10-CM | POA: Insufficient documentation

## 2015-08-24 DIAGNOSIS — R110 Nausea: Secondary | ICD-10-CM | POA: Insufficient documentation

## 2015-08-24 DIAGNOSIS — R1030 Lower abdominal pain, unspecified: Secondary | ICD-10-CM | POA: Diagnosis present

## 2015-08-24 DIAGNOSIS — R1031 Right lower quadrant pain: Secondary | ICD-10-CM | POA: Diagnosis not present

## 2015-08-24 DIAGNOSIS — N949 Unspecified condition associated with female genital organs and menstrual cycle: Secondary | ICD-10-CM | POA: Diagnosis not present

## 2015-08-24 DIAGNOSIS — Z880 Allergy status to penicillin: Secondary | ICD-10-CM | POA: Diagnosis not present

## 2015-08-24 LAB — CBC
HCT: 41.3 % (ref 35.0–47.0)
HEMOGLOBIN: 14.1 g/dL (ref 12.0–16.0)
MCH: 28.6 pg (ref 26.0–34.0)
MCHC: 34.1 g/dL (ref 32.0–36.0)
MCV: 83.9 fL (ref 80.0–100.0)
PLATELETS: 236 10*3/uL (ref 150–440)
RBC: 4.93 MIL/uL (ref 3.80–5.20)
RDW: 13.2 % (ref 11.5–14.5)
WBC: 5.6 10*3/uL (ref 3.6–11.0)

## 2015-08-24 LAB — URINALYSIS COMPLETE WITH MICROSCOPIC (ARMC ONLY)
BILIRUBIN URINE: NEGATIVE
Bacteria, UA: NONE SEEN
Glucose, UA: NEGATIVE mg/dL
LEUKOCYTES UA: NEGATIVE
NITRITE: NEGATIVE
PH: 5 (ref 5.0–8.0)
PROTEIN: NEGATIVE mg/dL
SPECIFIC GRAVITY, URINE: 1.019 (ref 1.005–1.030)

## 2015-08-24 LAB — LIPASE, BLOOD: LIPASE: 19 U/L (ref 11–51)

## 2015-08-24 LAB — COMPREHENSIVE METABOLIC PANEL
ALK PHOS: 44 U/L (ref 38–126)
ALT: 17 U/L (ref 14–54)
ANION GAP: 6 (ref 5–15)
AST: 21 U/L (ref 15–41)
Albumin: 5.1 g/dL — ABNORMAL HIGH (ref 3.5–5.0)
BUN: 11 mg/dL (ref 6–20)
CALCIUM: 9.1 mg/dL (ref 8.9–10.3)
CO2: 26 mmol/L (ref 22–32)
CREATININE: 0.78 mg/dL (ref 0.44–1.00)
Chloride: 105 mmol/L (ref 101–111)
Glucose, Bld: 88 mg/dL (ref 65–99)
Potassium: 3.7 mmol/L (ref 3.5–5.1)
SODIUM: 137 mmol/L (ref 135–145)
Total Bilirubin: 0.7 mg/dL (ref 0.3–1.2)
Total Protein: 7.7 g/dL (ref 6.5–8.1)

## 2015-08-24 LAB — POCT PREGNANCY, URINE: Preg Test, Ur: NEGATIVE

## 2015-08-24 MED ORDER — ONDANSETRON HCL 4 MG/2ML IJ SOLN
INTRAMUSCULAR | Status: AC
  Filled 2015-08-24: qty 2

## 2015-08-24 MED ORDER — ONDANSETRON HCL 4 MG/2ML IJ SOLN
4.0000 mg | Freq: Once | INTRAMUSCULAR | Status: AC
  Administered 2015-08-24: 4 mg via INTRAVENOUS

## 2015-08-24 MED ORDER — DIATRIZOATE MEGLUMINE & SODIUM 66-10 % PO SOLN
15.0000 mL | Freq: Once | ORAL | Status: AC
  Administered 2015-08-24: 15 mL via ORAL

## 2015-08-24 MED ORDER — SODIUM CHLORIDE 0.9 % IV BOLUS (SEPSIS)
1000.0000 mL | Freq: Once | INTRAVENOUS | Status: AC
  Administered 2015-08-24: 1000 mL via INTRAVENOUS

## 2015-08-24 MED ORDER — LEVETIRACETAM 500 MG PO TABS
500.0000 mg | ORAL_TABLET | Freq: Once | ORAL | Status: AC
  Administered 2015-08-24: 500 mg via ORAL
  Filled 2015-08-24: qty 1

## 2015-08-24 MED ORDER — IOPAMIDOL (ISOVUE-300) INJECTION 61%
100.0000 mL | Freq: Once | INTRAVENOUS | Status: AC | PRN
  Administered 2015-08-24: 100 mL via INTRAVENOUS

## 2015-08-24 MED ORDER — MORPHINE SULFATE (PF) 4 MG/ML IV SOLN
4.0000 mg | Freq: Once | INTRAVENOUS | Status: AC
  Administered 2015-08-24: 4 mg via INTRAVENOUS

## 2015-08-24 MED ORDER — MORPHINE SULFATE (PF) 4 MG/ML IV SOLN
INTRAVENOUS | Status: AC
  Filled 2015-08-24: qty 1

## 2015-08-24 NOTE — ED Provider Notes (Signed)
[facility] Emergency Department Provider Note  ____________________________________________  Time seen: Approximately 9:59 PM  I have reviewed the triage vital signs and the nursing notes.   HISTORY  Chief Complaint Abdominal Pain    HPI Grace Norris is a 25 y.o. female patient complains of lower abdominal pain especially on the right side. Started 2 weeks ago as gotten worse since it's worse with movement or walking. Bumps in the road make it worse as well. He has some nausea but no real diarrhea or vomiting. Is moderately severe to severe has been no fever he's wondering about appendicitis since her brother had appendicitis.   Past Medical History  Diagnosis Date  . Epilepsy ([place])   . Migraines     There are no active problems to display for this patient.   History reviewed. No pertinent past surgical history.  Current Outpatient Rx  Name  Route  Sig  Dispense  Refill  . levETIRAcetam (KEPPRA) 250 MG tablet   Oral   Take 250-500 mg by mouth 2 (two) times daily. 250 mg am and 500mg  at 1900         . ketorolac (TORADOL) 10 MG tablet   Oral   Take 1 tablet (10 mg total) by mouth every 8 (eight) hours as needed for moderate pain. Patient not taking: Reported on 08/24/2015   15 tablet   0   . ketorolac (TORADOL) 10 MG tablet   Oral   Take 1 tablet (10 mg total) by mouth every 6 (six) hours as needed. Patient not taking: Reported on 08/24/2015   20 tablet   0   . promethazine (PHENERGAN) 12.5 MG tablet   Oral   Take 2 tablets (25 mg total) by mouth every 8 (eight) hours as needed (Headache). Patient not taking: Reported on 08/24/2015   30 tablet   0     Allergies Ibuprofen; Penicillins; Prednisone; and Oxycodone  No family history on file.  Social History Social History  Substance Use Topics  . Smoking status: Never Smoker   . Smokeless tobacco: None  . Alcohol Use: Yes    Review of Systems Constitutional: No  fever/chills Eyes: No visual changes. ENT: No sore throat. Cardiovascular: Denies chest pain. Respiratory: Denies shortness of breath. Gastrointestinal: See history of present illness. Genitourinary: Negative for dysuria. Musculoskeletal: Negative for back pain. Skin: Negative for rash.  10-point ROS otherwise negative.  ____________________________________________   PHYSICAL EXAM:  VITAL SIGNS: ED Triage Vitals  Enc Vitals Group     BP 08/24/15 1753 160/76 mmHg     Pulse Rate 08/24/15 1753 117     Resp 08/24/15 1753 20     Temp 08/24/15 1753 98.3 F (36.8 C)     Temp Source 08/24/15 1753 Oral     SpO2 08/24/15 1753 96 %     Weight 08/24/15 1753 129 lb (58.514 kg)     Height 08/24/15 1753 5\' 4"  (1.626 m)     Head Cir --      Peak Flow --      Pain Score 08/24/15 1813 10     Pain Loc --      Pain Edu? --      Excl. in [place]? --     Constitutional: Alert and oriented. Well appearing and in no acute distress. Eyes: Conjunctivae are normal. PERRL. EOMI. Head: Atraumatic. Nose: No congestion/rhinnorhea. Mouth/Throat: Mucous membranes are moist.  Oropharynx non-erythematous. Neck: No stridor.   Cardiovascular: Normal rate, regular rhythm.  Grossly normal heart sounds.  Good peripheral circulation. Respiratory: Normal respiratory effort.  No retractions. Lungs CTAB. Gastrointestinal: Soft . No distention. No abdominal bruits. No CVA tenderness. Genitourinary: Patient is very tender I'm unable to insert the speculum because the tenderness I do get my finger in to the vagina and she has a lot of bilateral adnexal tenderness and cervical motion tenderness. Musculoskeletal: No lower extremity tenderness nor edema.  No joint effusions. Neurologic:  Normal speech and language. No gross focal neurologic deficits are appreciated. No gait instability. Skin:  Skin is warm, dry and intact. No rash noted. Psychiatric: Mood and affect are normal. Speech and behavior are  normal.  ____________________________________________   LABS (all labs ordered are listed, but only abnormal results are displayed)  Labs Reviewed  COMPREHENSIVE METABOLIC PANEL - Abnormal; Notable for the following:    Albumin 5.1 (*)    All other components within normal limits  URINALYSIS COMPLETEWITH MICROSCOPIC ([facility] ONLY) - Abnormal; Notable for the following:    Color, Urine YELLOW (*)    APPearance CLEAR (*)    Ketones, ur 1+ (*)    Hgb urine dipstick 1+ (*)    Squamous Epithelial / LPF 0-5 (*)    All other components within normal limits  LIPASE, BLOOD  CBC  POCT PREGNANCY, URINE  POC URINE PREG, ED   ____________________________________________  EKG   ____________________________________________  RADIOLOGY  CT shows no acute pathology no appendicitis ____________________________________________   PROCEDURES   ____________________________________________   INITIAL IMPRESSION / ASSESSMENT AND PLAN / ED COURSE  Pertinent labs & imaging results that were available during my care of the patient were reviewed by me and considered in my medical decision making (see chart for details).  I paged Dr. Marcelline Mates she is doing a C-section she will come down and evaluate the patient afterwards ____________________________________________   FINAL CLINICAL IMPRESSION(S) / ED DIAGNOSES  Final diagnoses:  Pelvic pain in female   Probable PID   Nena Polio, MD 08/25/15 0004

## 2015-08-24 NOTE — ED Notes (Signed)
Pt up to BR. NAD at this time.

## 2015-08-24 NOTE — ED Notes (Signed)
Lower abdominal pain x 2 weeks. Denies vomiting.

## 2015-08-25 ENCOUNTER — Encounter: Payer: Self-pay | Admitting: Obstetrics and Gynecology

## 2015-08-25 DIAGNOSIS — R1031 Right lower quadrant pain: Secondary | ICD-10-CM | POA: Diagnosis not present

## 2015-08-25 DIAGNOSIS — N949 Unspecified condition associated with female genital organs and menstrual cycle: Secondary | ICD-10-CM | POA: Diagnosis not present

## 2015-08-25 DIAGNOSIS — R110 Nausea: Secondary | ICD-10-CM | POA: Diagnosis not present

## 2015-08-25 DIAGNOSIS — R102 Pelvic and perineal pain: Secondary | ICD-10-CM | POA: Diagnosis not present

## 2015-08-25 LAB — CHLAMYDIA/NGC RT PCR (ARMC ONLY)
Chlamydia Tr: NOT DETECTED
N gonorrhoeae: NOT DETECTED

## 2015-08-25 MED ORDER — CEFTRIAXONE SODIUM 250 MG IJ SOLR
250.0000 mg | Freq: Once | INTRAMUSCULAR | Status: AC
  Administered 2015-08-25: 250 mg via INTRAMUSCULAR
  Filled 2015-08-25: qty 250

## 2015-08-25 MED ORDER — LIDOCAINE HCL (PF) 1 % IJ SOLN
INTRAMUSCULAR | Status: AC
  Administered 2015-08-25: 5 mL
  Filled 2015-08-25: qty 5

## 2015-08-25 MED ORDER — DOXYCYCLINE HYCLATE 100 MG PO TABS
100.0000 mg | ORAL_TABLET | Freq: Two times a day (BID) | ORAL | Status: DC
  Administered 2015-08-25: 100 mg via ORAL
  Filled 2015-08-25: qty 1

## 2015-08-25 MED ORDER — HYDROCODONE-ACETAMINOPHEN 5-325 MG PO TABS: 2.0000 | ORAL_TABLET | Freq: Four times a day (QID) | ORAL | Status: DC | PRN

## 2015-08-25 NOTE — Consult Note (Signed)
GYNECOLOGY CONSULT HISTORY AND PHYSICAL  Reason for Consult: Pelvic pain Referring Physician: Conni Slipper, MD (ER Physician)  Grace Norris is an 25 y.o. P0 female who presented to the Emergency Room with complaints of lower abdominal pain (mostly right sided).  Onset was approximately 2 weeks ago, progressively worsening.  Certain movements aggravate pain, no alleviating symptoms.  Pain initially was intermittent, but now described as constant.  Pain is described as moderate to severe in intensity.  Denies vomiting, fevers, chills, but does note some nausea.    Pertinent Gynecological History: Contraception: condoms, but not consistent.  Has not used in the past 1-2 months as currently desires to conceive.  Blood transfusions: none Sexually transmitted diseases: no past history Last pap: normal Date: 2-3 years ago.  Sees a physician in [place].  OB History: G0, P0   Menstrual History: Menarche age: 71 Menses: flow is moderate and regular every 30 days without intermenstrual spotting, usually lasts 4-5 days, but has lasted 2 weeks this cycle.  Patient's last menstrual period was 08/12/2015.  OB History  Gravida Para Term Preterm AB SAB TAB Ectopic Multiple Living  _0        Past Medical History  Diagnosis Date  . Epilepsy ([place])   . Migraines     History reviewed. No pertinent past surgical history.  Family History  Problem Relation Age of Onset  . Scoliosis Brother   . Fibromyalgia Mother   . Alzheimer's disease Other     Social History   Social History  . Marital Status: Single    Spouse Name: N/A  . Number of Children: N/A  . Years of Education: N/A   Occupational History  . Not on file.   Social History Main Topics  . Smoking status: Never Smoker   . Smokeless tobacco: Not on file  . Alcohol Use: Yes  . Drug Use: No  . Sexual Activity: Not on file   Other Topics Concern  . Not on file   Social History Narrative    Allergies:   Allergies  Allergen Reactions  . Ibuprofen Nausea And Vomiting  . Penicillins Other (See Comments)    Seizures Unknown if it caused rash,swelling, shortness of breath or skin necrosis. Patient says she has not had a reaction in past ten years. Has not taken Penicillin since diagnosed with epilepsy.  . Prednisone Nausea And Vomiting  . Oxycodone Rash    Medications: Prior to Admission:  (Not in a hospital admission)  Review of Systems  Constitutional: Negative for fever, chills, weight loss and malaise/fatigue.  HENT: Negative for congestion and sore throat.   Eyes: Negative for blurred vision and discharge.  Respiratory: Negative for cough and shortness of breath.   Cardiovascular: Negative for chest pain and palpitations.  Gastrointestinal: Positive for nausea and abdominal pain. Negative for vomiting, diarrhea, constipation and blood in stool.       Mostly RLQ  Genitourinary: Negative for dysuria, urgency, frequency, hematuria and flank pain.  Musculoskeletal: Negative for myalgias and joint pain.  Skin: Negative for itching and rash.  Neurological: Negative for dizziness, seizures and headaches.  Endo/Heme/Allergies: Does not bruise/bleed easily.  Psychiatric/Behavioral: Negative for depression and suicidal ideas.    Blood pressure 121/68, pulse 101, temperature 98.3 F (36.8 C), temperature source Oral, resp. rate 18, height _1  (1.626 m), weight 129 lb (58.514 kg), last menstrual period 08/24/2015, SpO2 100 %. Physical Exam  Constitutional: She is oriented  to person, place, and time. She appears well-developed and well-nourished. No distress.  HENT:  Head: Normocephalic and atraumatic.  Mouth/Throat: Oropharynx is clear and moist.  Eyes: Conjunctivae and EOM are normal. Pupils are equal, round, and reactive to light.  Neck: Normal range of motion. Neck supple. No tracheal deviation present. No thyromegaly present.  Cardiovascular: Normal rate, regular rhythm and normal  heart sounds.  Exam reveals no gallop and no friction rub.   No murmur heard. Respiratory: Effort normal and breath sounds normal. No respiratory distress. She has no wheezes.  GI: Soft. Bowel sounds are normal. She exhibits no distension and no mass. There is tenderness. There is no rebound and no guarding.  RLQ  Genitourinary:  External genitalia normal. Tampon removed to reveal vagina with scant dark brown discharge on speculum exam.  Cervix appears normal, no lesions, mild erythema present. Patient very tender to speculum and bimanual exam, mostly at introitus, but some CMT also noted.  Bilateral adnexal tenderness present, but right>left. Uterus normal size, shape, consistency.   Musculoskeletal: Normal range of motion.  Neurological: She is alert and oriented to person, place, and time.  Skin: Skin is warm and dry. No erythema.  Psychiatric: She has a normal mood and affect. Her behavior is normal.    Labs:  Results for orders placed or performed during the hospital encounter of 08/24/15 (from the past 48 hour(s))  Pregnancy, urine POC     Status: None   Collection Time: 08/24/15  6:01 PM  Result Value Ref Range   Preg Test, Ur NEGATIVE NEGATIVE    Comment:        THE SENSITIVITY OF THIS METHODOLOGY IS >24 mIU/mL   Urinalysis complete, with microscopic ([facility] only)     Status: Abnormal   Collection Time: 08/24/15  6:18 PM  Result Value Ref Range   Color, Urine YELLOW (A) YELLOW   APPearance CLEAR (A) CLEAR   Glucose, UA NEGATIVE NEGATIVE mg/dL   Bilirubin Urine NEGATIVE NEGATIVE   Ketones, ur 1+ (A) NEGATIVE mg/dL   Specific Gravity, Urine 1.019 1.005 - 1.030   Hgb urine dipstick 1+ (A) NEGATIVE   pH 5.0 5.0 - 8.0   Protein, ur NEGATIVE NEGATIVE mg/dL   Nitrite NEGATIVE NEGATIVE   Leukocytes, UA NEGATIVE NEGATIVE   RBC / HPF 0-5 0 - 5 RBC/hpf   WBC, UA 0-5 0 - 5 WBC/hpf   Bacteria, UA NONE SEEN NONE SEEN   Squamous Epithelial / LPF 0-5 (A) NONE SEEN   Mucous PRESENT    Lipase, blood     Status: None   Collection Time: 08/24/15  7:18 PM  Result Value Ref Range   Lipase 19 11 - 51 U/L  Comprehensive metabolic panel     Status: Abnormal   Collection Time: 08/24/15  7:18 PM  Result Value Ref Range   Sodium 137 135 - 145 mmol/L   Potassium 3.7 3.5 - 5.1 mmol/L   Chloride 105 101 - 111 mmol/L   CO2 26 22 - 32 mmol/L   Glucose, Bld 88 65 - 99 mg/dL   BUN 11 6 - 20 mg/dL   Creatinine, Ser 0.78 0.44 - 1.00 mg/dL   Calcium 9.1 8.9 - 10.3 mg/dL   Total Protein 7.7 6.5 - 8.1 g/dL   Albumin 5.1 (H) 3.5 - 5.0 g/dL   AST 21 15 - 41 U/L   ALT 17 14 - 54 U/L   Alkaline Phosphatase 44 38 - 126 U/L   Total  Bilirubin 0.7 0.3 - 1.2 mg/dL   GFR calc non Af Amer >60 >60 mL/min   GFR calc Af Amer >60 >60 mL/min    Comment: (NOTE) The eGFR has been calculated using the CKD EPI equation. This calculation has not been validated in all clinical situations. eGFR's persistently <60 mL/min signify possible Chronic Kidney Disease.    Anion gap 6 5 - 15  CBC     Status: None   Collection Time: 08/24/15  7:18 PM  Result Value Ref Range   WBC 5.6 3.6 - 11.0 K/uL   RBC 4.93 3.80 - 5.20 MIL/uL   Hemoglobin 14.1 12.0 - 16.0 g/dL   HCT 41.3 35.0 - 47.0 %   MCV 83.9 80.0 - 100.0 fL   MCH 28.6 26.0 - 34.0 pg   MCHC 34.1 32.0 - 36.0 g/dL   RDW 13.2 11.5 - 14.5 %   Platelets 236 150 - 440 K/uL    Ct Abdomen Pelvis W Contrast  08/24/2015  CLINICAL DATA:  Abdominal pain x2 weeks, nausea EXAM: CT ABDOMEN AND PELVIS WITH CONTRAST TECHNIQUE: Multidetector CT imaging of the abdomen and pelvis was performed using the standard protocol following bolus administration of intravenous contrast. CONTRAST:  161m ISOVUE-300 IOPAMIDOL (ISOVUE-300) INJECTION 61% COMPARISON:  None. FINDINGS: Lower chest:  Lung bases are clear. Hepatobiliary: Liver is within normal limits. Gallbladder is unremarkable. No intrahepatic or extrahepatic ductal dilatation. Pancreas: Within normal limits. Spleen:  Within normal limits. Adrenals/Urinary Tract: Adrenal glands are within normal limits. Kidneys are within normal limits.  No hydronephrosis. Bladder is within normal limits. Stomach/Bowel: Stomach is within normal limits. No evidence of bowel obstruction. Normal appendix (series 5/ image 44). Vascular/Lymphatic: No evidence of abdominal aortic aneurysm. No suspicious abdominopelvic lymphadenopathy. Reproductive: Retroverted uterus. Bilateral ovaries are within normal limits. Other: No abdominopelvic ascites. Musculoskeletal: Visualized osseous structures are within normal limits. IMPRESSION: No evidence of bowel obstruction.  Normal appendix. No CT findings to account for the patient's abdominal pain. Electronically Signed   By: SJulian HyM.D.   On: 08/24/2015 23:01    Assessment/Plan: 1. Findings suspicious for PID. CT scan with no significant findings as source of pain. UA negative for UTI.  Cervical cultures collected. Can treat according to CDC guidelines for outpatient management (patient only with mild nausea, no vomiting, is afebrile).   Can also treat pain outpatient (Dilaudid as patient allergic to oxycodone).  Can also treat nausea with Zofran. Patient will need to f/u in clinic in 2-3 days to assess outpatient therapy.  2. Dysfunctional uterine bleeding.  Patient notes her cycles usually last for 4-5 days, but current cycle has lasted x 2 weeks. Cycle is not heavy and patient notes that it is slowing down. Declines use of hormonal therapy for regulation as she notes that she desires to conceive. Will f/u as outpatient.    ARubie Maid3/26/2017

## 2015-08-25 NOTE — Discharge Instructions (Signed)
If you have any problems with the hydrocodone which I gave you instead of the Percocet please return to the emergency room.

## 2015-08-27 ENCOUNTER — Ambulatory Visit (INDEPENDENT_AMBULATORY_CARE_PROVIDER_SITE_OTHER): Payer: Medicare Other | Admitting: Obstetrics and Gynecology

## 2015-08-27 VITALS — BP 119/86 | HR 67 | Ht 64.0 in | Wt 130.4 lb

## 2015-08-27 DIAGNOSIS — N938 Other specified abnormal uterine and vaginal bleeding: Secondary | ICD-10-CM | POA: Diagnosis not present

## 2015-08-27 DIAGNOSIS — R102 Pelvic and perineal pain: Secondary | ICD-10-CM | POA: Diagnosis not present

## 2015-08-27 DIAGNOSIS — N730 Acute parametritis and pelvic cellulitis: Secondary | ICD-10-CM | POA: Diagnosis not present

## 2015-08-27 MED ORDER — OXYCODONE-ACETAMINOPHEN 5-325 MG PO TABS: 1.0000 | ORAL_TABLET | Freq: Four times a day (QID) | ORAL | Status: DC | PRN

## 2015-08-27 MED ORDER — LEVONORGESTREL-ETHINYL ESTRAD 0.15-30 MG-MCG PO TABS: 1.0000 | ORAL_TABLET | Freq: Every day | ORAL | Status: DC

## 2015-09-02 ENCOUNTER — Telehealth: Payer: Self-pay | Admitting: Obstetrics and Gynecology

## 2015-09-02 DIAGNOSIS — R1111 Vomiting without nausea: Secondary | ICD-10-CM

## 2015-09-02 MED ORDER — PROMETHAZINE HCL 25 MG PO TABS: 25.0000 mg | ORAL_TABLET | Freq: Four times a day (QID) | ORAL | Status: DC | PRN

## 2015-09-02 NOTE — Telephone Encounter (Signed)
PT WAS GIVEN AN ANTIBIOTIC 2 WKS AGO. SHE SAID SHE CAN'T TAKE IT ANYMORE BECAUSE SHE THROWS UP. SHE SAID SHE HAS 5 DAYS LEFT. SHE IS GOING TO STOP IT.

## 2015-09-02 NOTE — Telephone Encounter (Signed)
Called pt she states that the antibiotic she has been taking is causing her to vomit, pt states she has tried taking the medication with food and at night with no relief. Pt states she has 5 days of medication left to take. Advised pt to take medication before bed to possibly sleep through any side effects, also advised pt I would send in rx for phenergan short term to take with medication for the next several days. RX sent.

## 2015-09-19 ENCOUNTER — Other Ambulatory Visit: Payer: Self-pay | Admitting: Orthopedic Surgery

## 2015-09-19 DIAGNOSIS — M25531 Pain in right wrist: Secondary | ICD-10-CM

## 2015-09-19 DIAGNOSIS — M25331 Other instability, right wrist: Secondary | ICD-10-CM

## 2015-09-26 ENCOUNTER — Ambulatory Visit: Payer: Medicare Other | Admitting: Obstetrics and Gynecology

## 2015-10-02 ENCOUNTER — Ambulatory Visit
Admission: RE | Admit: 2015-10-02 | Discharge: 2015-10-02 | Disposition: A | Discharge status: Home / Self Care | Payer: Medicare Other | Source: Ambulatory Visit | Attending: Orthopedic Surgery | Admitting: Orthopedic Surgery

## 2015-10-02 DIAGNOSIS — M25331 Other instability, right wrist: Secondary | ICD-10-CM | POA: Diagnosis present

## 2015-10-02 DIAGNOSIS — M25531 Pain in right wrist: Secondary | ICD-10-CM | POA: Diagnosis present

## 2015-10-10 ENCOUNTER — Ambulatory Visit (INDEPENDENT_AMBULATORY_CARE_PROVIDER_SITE_OTHER): Payer: Medicare Other | Admitting: Obstetrics and Gynecology

## 2015-10-10 ENCOUNTER — Encounter: Payer: Self-pay | Admitting: Obstetrics and Gynecology

## 2015-10-10 VITALS — BP 128/76 | HR 90 | Ht 64.0 in | Wt 127.0 lb

## 2015-10-10 DIAGNOSIS — R102 Pelvic and perineal pain: Secondary | ICD-10-CM | POA: Diagnosis not present

## 2015-10-10 DIAGNOSIS — Z809 Family history of malignant neoplasm, unspecified: Secondary | ICD-10-CM | POA: Diagnosis not present

## 2015-10-10 DIAGNOSIS — D229 Melanocytic nevi, unspecified: Secondary | ICD-10-CM | POA: Diagnosis not present

## 2015-10-10 NOTE — Progress Notes (Signed)
    GYNECOLOGY PROGRESS NOTE  Subjective:    Patient ID: Grace Norris, female    DOB: [birth year], 25 y.o.   MRN: [id number]  HPI  Patient is a 25 y.o. G0P0000 female who presents for f/u of pelvic pain.  Was initiated on OCPs last visit.  Patient notes that pain has improved significantly.  Does note that she has had some spotting while on pills.  Desires to know when she can discontinue the pills in order to attempt conception.   In addition, patient notes that she has a skin lesion on right breast, with associated itching. Reports concern as she has a family history of breast cancer.   The following portions of the patient's history were reviewed and updated as appropriate: allergies, current medications, past family history, past medical history, past social history, past surgical history and problem list.  Review of Systems Pertinent items noted in HPI and remainder of comprehensive ROS otherwise negative.   Objective:   Blood pressure 128/76, pulse 90, height _0  (1.626 m), weight 127 lb (57.607 kg), last menstrual period 10/02/2015. General appearance: alert and no distress Breasts: normal appearance, no masses or tenderness, No nipple retraction or dimpling, No nipple discharge or bleeding, No axillary or supraclavicular adenopathy Skin: small 3 mm pigmented mole noted at base of right breast, nontender.  Patient with several small moles scattered over body.    Assessment:   Pelvic pain in female  Benign pigmented mole  Family history of cancer  Plan:   - Pelvic pain improved with OCPs.  Patient to continue use.  Once she and partner have decided to conceive, can d/c pills.  - Benign skin finding, patient given reassurance.  Discussed routine skin care (observing for any new lesions, wearing sunscreen in warmer months, and should consider beeing seen at least annually by a Dermatologist.  -  Patient with family h/o breast/ovarian cancer.  H/o breast cancer in [place] (age  41, had unilateral lumpectomy) and ovarian cancer in mother (age 37, had hysterectomy with RSO).  Discussed self breast exams, given information on heriditary genetic cancer screening (i.e. BRCA testing).  Patient desires to talk it over with her family but is considering doing it.  - To f/u in August for annual exam.    A total of 15 minutes were spent face-to-face with the patient during this encounter and over half of that time dealt with counseling and coordination of care.   Rubie Maid, MD [facility]

## 2015-10-12 DIAGNOSIS — R102 Pelvic and perineal pain: Secondary | ICD-10-CM | POA: Insufficient documentation

## 2015-10-12 DIAGNOSIS — Z809 Family history of malignant neoplasm, unspecified: Secondary | ICD-10-CM | POA: Insufficient documentation

## 2015-10-12 NOTE — Progress Notes (Signed)
GYNECOLOGY PROGRESS NOTE  Subjective:    Patient ID: Grace Norris, female    DOB: 1990-11-13, 25 y.o.   MRN: [id number]  HPI  Patient is a 25 y.o. G0P0 female who presents for f/u after ER visit 2 days ago for treatment of suspected PID.  Patient notes that she has been taking antibiotics as prescribed.  Notes that her pain has worsened, is mostly right sided. Was ruled out for possible pregnancy and pelvic/abdominal masses or other processes.    Past Medical History  Diagnosis Date  . Epilepsy ([place])   . Migraines     Family History  Problem Relation Age of Onset  . Scoliosis Brother   . Fibromyalgia Mother   . Alzheimer's disease Other     Social History   Social History  . Marital Status: Single    Spouse Name: N/A  . Number of Children: N/A  . Years of Education: N/A   Occupational History  . Not on file.   Social History Main Topics  . Smoking status: Never Smoker   . Smokeless tobacco: Not on file  . Alcohol Use: Yes  . Drug Use: No  . Sexual Activity: Not on file   Other Topics Concern  . Not on file   Social History Narrative    Current Outpatient Prescriptions on File Prior to Visit  Medication Sig Dispense Refill  . ketorolac (TORADOL) 10 MG tablet Take 1 tablet (10 mg total) by mouth every 6 (six) hours as needed. 20 tablet 0  . levETIRAcetam (KEPPRA) 250 MG tablet Take 250-500 mg by mouth 2 (two) times daily. 250 mg am and 500mg  at 1900     No current facility-administered medications on file prior to visit.    Allergies  Allergen Reactions  . Prednisone Nausea And Vomiting    Other reaction(s): Vomiting  . Ibuprofen Nausea And Vomiting and Rash    Other reaction(s): Nausea And Vomiting  . Latex Rash  . Oxycodone Rash  . Penicillins Other (See Comments)    Other reaction(s): Other (See Comments) Patient reports seizures Other reaction(s): Other (See Comments) Seizures Seizures Unknown if it caused rash,swelling, shortness of  breath or skin necrosis. Patient says she has not had a reaction in past ten years. Has not taken Penicillin since diagnosed with epilepsy.     Review of Systems A comprehensive review of systems was negative except for: Genitourinary: positive for abnormal menstrual periods (recent cycle lasted x 2 weeks, usually lasts 4-5 days)  Objective:   Blood pressure 119/86, pulse 67, height 5\' 4"  (1.626 m), weight 130 lb 6.4 oz (59.149 kg), last menstrual period 08/24/2015. General appearance: alert and no distress Abdomen: normal findings: bowel sounds normal, no masses palpable, no organomegaly and soft and abnormal findings:  mild tenderness in the RLQ Pelvic: cervix normal in appearance, external genitalia normal, no adnexal masses or tenderness, positive findings: positive cervical motion tenderness, rectovaginal septum normal, uterus normal size, shape, and consistency and vagina normal without discharge Extremities: extremities normal, atraumatic, no cyanosis or edema Neurologic: Grossly normal   Assessment:   Suspected PID Dysfunctional uterine bleeding Pelvic pain  Plan:   Patient to continue finishing course of antibiotics.  Review of labs notes negative cultures for GC/Cl. May possibly be due to other atypical infectious cause or other abdomina/pelvic inflammatory process (i.e. Endometriosis, IBS, etc).  Discussion had again on management of bleedng.  Patient initially declined hormonal management in ER due to wanting to conceive.  Advised patient to try OCPs for short trial to see if pain resolves. If pain does resolve, can continue OCPs until pregnancy desired.  To take Ibuprofen prn for pain.  RTC in 1 month to f/u pain and bleeding symptoms on OCPs.   Rubie Maid, MD [facility]

## 2015-10-13 ENCOUNTER — Encounter: Payer: Self-pay | Admitting: Obstetrics and Gynecology

## 2015-10-23 ENCOUNTER — Encounter: Payer: Self-pay | Admitting: Obstetrics and Gynecology

## 2015-11-01 ENCOUNTER — Encounter: Payer: Self-pay | Admitting: Obstetrics and Gynecology

## 2015-11-19 ENCOUNTER — Encounter: Payer: Self-pay | Admitting: Obstetrics and Gynecology

## 2015-11-25 ENCOUNTER — Encounter: Payer: Self-pay | Admitting: Obstetrics and Gynecology

## 2015-11-27 ENCOUNTER — Other Ambulatory Visit: Payer: Self-pay | Admitting: Obstetrics and Gynecology

## 2015-11-27 MED ORDER — NORETHINDRONE-ETH ESTRADIOL 1-35 MG-MCG PO TABS: 1.0000 | ORAL_TABLET | Freq: Every day | ORAL | Status: DC

## 2015-11-30 HISTORY — PX: HAND TENDON SURGERY: SHX663

## 2015-12-20 ENCOUNTER — Encounter: Payer: Self-pay | Admitting: Obstetrics and Gynecology

## 2015-12-20 ENCOUNTER — Telehealth: Payer: Self-pay | Admitting: Obstetrics and Gynecology

## 2015-12-20 NOTE — Telephone Encounter (Signed)
PAIN IN LOWER ABDOMEN. STARTED HER CYCLE WED AND STARTED NEW BC. SHE SAID HER PAIN IS SEVERE BUT HAS NOT TAKEN ANYTHING FOR IT.

## 2015-12-20 NOTE — Telephone Encounter (Signed)
I literally just responded to this pt via mychart. I will await her response there before calling back

## 2015-12-20 NOTE — Telephone Encounter (Signed)
Responded to pt via mychart

## 2015-12-22 ENCOUNTER — Encounter: Payer: Self-pay | Admitting: Obstetrics and Gynecology

## 2015-12-23 ENCOUNTER — Encounter: Payer: Self-pay | Admitting: Obstetrics and Gynecology

## 2015-12-24 ENCOUNTER — Encounter: Payer: Self-pay | Admitting: Obstetrics and Gynecology

## 2015-12-24 ENCOUNTER — Ambulatory Visit (INDEPENDENT_AMBULATORY_CARE_PROVIDER_SITE_OTHER): Payer: Medicare Other | Admitting: Obstetrics and Gynecology

## 2015-12-24 VITALS — BP 119/89 | HR 83 | Ht 64.0 in | Wt 121.8 lb

## 2015-12-24 DIAGNOSIS — R102 Pelvic and perineal pain: Secondary | ICD-10-CM | POA: Diagnosis not present

## 2015-12-24 DIAGNOSIS — N921 Excessive and frequent menstruation with irregular cycle: Secondary | ICD-10-CM | POA: Diagnosis not present

## 2015-12-24 DIAGNOSIS — N941 Unspecified dyspareunia: Secondary | ICD-10-CM | POA: Diagnosis not present

## 2015-12-24 MED ORDER — HYDROCODONE-ACETAMINOPHEN 5-325 MG PO TABS: 1.0000 | ORAL_TABLET | Freq: Four times a day (QID) | ORAL | 0 refills | Status: DC | PRN

## 2015-12-24 MED ORDER — NORETHINDRONE-ETH ESTRADIOL 1-35 MG-MCG PO TABS: 1.0000 | ORAL_TABLET | Freq: Every day | ORAL | 4 refills | Status: DC

## 2015-12-24 NOTE — Patient Instructions (Signed)
Endometriosis Endometriosis is a condition in which the tissue that lines the uterus (endometrium) grows outside of its normal location. The tissue may grow in many locations close to the uterus, but it commonly grows on the ovaries, fallopian tubes, vagina, or bowel. Because the uterus expels, or sheds, its lining every menstrual cycle, there is bleeding wherever the endometrial tissue is located. This can cause pain because blood is irritating to tissues not normally exposed to it.  CAUSES  The cause of endometriosis is not known.  SIGNS AND SYMPTOMS  Often, there are no symptoms. When symptoms are present, they can vary with the location of the displaced tissue. Various symptoms can occur at different times. Although symptoms occur mainly during a woman's menstrual period, they can also occur midcycle and usually stop with menopause. Some people may go months with no symptoms at all. Symptoms may include:   Back or abdominal pain.   Heavier bleeding during periods.   Pain during intercourse.   Painful bowel movements.   Infertility. DIAGNOSIS  Your health care provider will do a physical exam and ask about your symptoms. Various tests may be done, such as:   Blood tests and urine tests. These are done to help rule out other problems.   Ultrasound. This test is done to look for abnormal tissue.   An X-ray of the lower bowel (barium enema).  Laparoscopy. In this procedure, a thin, lighted tube with a tiny camera on the end (laparoscope) is inserted into your abdomen. This helps your health care provider look for abnormal tissue to confirm the diagnosis. The health care provider may also remove a small piece of tissue (biopsy) from any abnormal tissue found. This tissue sample can then be sent to a lab so it can be looked at under a microscope. TREATMENT  Treatment will vary and may include:   Medicines to relieve pain. Nonsteroidal anti-inflammatory drugs (NSAIDs) are a type of  pain medicine that can help to relieve the pain caused by endometriosis.  Hormonal therapy. When using hormonal therapy, periods are eliminated. This eliminates the monthly exposure to blood by the displaced endometrial tissue.   Surgery. Surgery may sometimes be done to remove the abnormal endometrial tissue. In severe cases, surgery may be done to remove the fallopian tubes, uterus, and ovaries (hysterectomy). HOME CARE INSTRUCTIONS   Take all medicines as directed by your health care provider. Do not take aspirin because it may increase bleeding when you are not on hormonal therapy.   Avoid activities that produce pain, including sexual activity. SEEK MEDICAL CARE IF:  You have pelvic pain before, after, or during your periods.  You have pelvic pain between periods that gets worse during your period.  You have pelvic pain during or after sex.  You have pelvic pain with bowel movements or urination, especially during your period.  You have problems getting pregnant.  You have a fever. SEEK IMMEDIATE MEDICAL CARE IF:   Your pain is severe and is not responding to pain medicine.   You have severe nausea and vomiting, or you cannot keep foods down.   You have pain that is limited to the right lower part of your abdomen.   You have swelling or increasing pain in your abdomen.   You see blood in your stool.  MAKE SURE YOU:   Understand these instructions.  Will watch your condition.  Will get help right away if you are not doing well or get worse.   This information   is not intended to replace advice given to you by your health care provider. Make sure you discuss any questions you have with your health care provider.   Document Released: 05/15/2000 Document Revised: 06/08/2014 Document Reviewed: 01/13/2013 Elsevier Interactive Patient Education 2016 [employer]  

## 2015-12-26 ENCOUNTER — Other Ambulatory Visit: Payer: Self-pay

## 2015-12-26 DIAGNOSIS — N946 Dysmenorrhea, unspecified: Secondary | ICD-10-CM

## 2015-12-26 DIAGNOSIS — N921 Excessive and frequent menstruation with irregular cycle: Secondary | ICD-10-CM

## 2015-12-26 MED ORDER — LEVONORGEST-ETH ESTRAD 91-DAY 0.15-0.03 &0.01 MG PO TABS: 1.0000 | ORAL_TABLET | Freq: Every day | ORAL | 4 refills | Status: DC

## 2015-12-26 NOTE — Progress Notes (Signed)
    GYNECOLOGY PROGRESS NOTE  Subjective:    Patient ID: NA GATTO, female    DOB: 02/26/1991, 25 y.o.   MRN: [id number]  HPI  Patient is a 25 y.o. G0P0 female who presents for f/u of pelvic pain.  Had to change OCPs last month due to breakthrough bleeding.  Patient notes that she is still experiencing breakthrough bleeding, and now is also having more pain.  Was seen at [facility] Emergency Room last week, but notes that they ruled out ovarian cyst and appendicitis, but did not treat her pain.  Instructed her to f/u with GYN. Pain is noted to be worse around menstrual cycle. Also reports that intercourse was painful ~ 2 weeks ago.   The following portions of the patient's history were reviewed and updated as appropriate: allergies, current medications, past family history, past medical history, past social history, past surgical history and problem list .   Review of Systems Pertinent items noted in HPI and remainder of comprehensive ROS otherwise negative.   Objective:   Blood pressure 119/89, pulse 83, height 5\' 4"  (1.626 m), weight 121 lb 12.8 oz (55.2 kg), last menstrual period 12/17/2015. General appearance: alert and no distress   Abdomen: soft, non-tender; bowel sounds normal; no masses,  no organomegaly Pelvic: deferred at patient's request.    Assessment:   Pelvic pain Breakthrough bleeding on OCP Dyspareunia  Plan:   - Symptoms suggestive of endometriosis.  Pelvic pain initially improved with OCPs however had to change due to breakthrough bleeding.  Now still noting bleeding and pain.  Discussed alternative methods of hormonal control with contraceptives.  Does not desire to be on anything longer term as she and partner will desire to conceive soon.   Will prescribe continuous OCP regimen (Seasonique).  - Patient request something for pain.  Has taken Tramadol in the past, but notes that this pain is worse.  Will give short term of Norco.   - To f/u in 1 month for  scheduled annual exam.     A total of 15 minutes were spent face-to-face with the patient during this encounter and over half of that time dealt with counseling and coordination of care.   Rubie Maid, MD [facility]

## 2015-12-30 ENCOUNTER — Telehealth: Payer: Self-pay | Admitting: Obstetrics and Gynecology

## 2015-12-30 NOTE — Telephone Encounter (Signed)
Called pt she states that she called her insurance and they her they were unable to tell her what OCPs were covered as they are going "through a change", they stated to her that what they will pay for is dependent upon strength, dose, and day supply. Pt states she would like another OCP sent in for 90day supply to see if it is covered. Of note pt has multiple insurances. Please advise

## 2015-12-30 NOTE — Telephone Encounter (Signed)
Grace Norris looks like your pt

## 2015-12-30 NOTE — Telephone Encounter (Signed)
PT CALLED AND SAID THAT HER INSURANCE WOULD NOT COVER HER BC.

## 2015-12-31 ENCOUNTER — Telehealth: Payer: Self-pay | Admitting: Obstetrics and Gynecology

## 2015-12-31 DIAGNOSIS — N946 Dysmenorrhea, unspecified: Secondary | ICD-10-CM

## 2015-12-31 MED ORDER — LEVONORGEST-ETH EST & ETH EST 42-21-21-7 DAYS PO TABS: 1.0000 | ORAL_TABLET | Freq: Every day | ORAL | 3 refills | Status: DC

## 2015-12-31 MED ORDER — LEVONORGEST-ETH ESTRAD 91-DAY 0.15-0.03 MG PO TABS: 1.0000 | ORAL_TABLET | Freq: Every day | ORAL | 4 refills | Status: DC

## 2015-12-31 NOTE — Telephone Encounter (Signed)
Pt called and said her ins will pay for [facility]? BC pills. And could you send to pharmacy. Not sure that's spelled right. She didn't know how to spell either. sorry

## 2015-12-31 NOTE — Telephone Encounter (Signed)
Ok.  We can prescribe Quartette (90 day supply).

## 2015-12-31 NOTE — Telephone Encounter (Signed)
RX sent in for Seasonale.

## 2016-01-09 ENCOUNTER — Emergency Department
Admission: EM | Admit: 2016-01-09 | Discharge: 2016-01-09 | Disposition: A | Discharge status: Home / Self Care | Payer: Medicare Other | Attending: Emergency Medicine | Admitting: Emergency Medicine

## 2016-01-09 ENCOUNTER — Emergency Department: Payer: Medicare Other

## 2016-01-09 ENCOUNTER — Encounter: Payer: Self-pay | Admitting: Intensive Care

## 2016-01-09 DIAGNOSIS — R0789 Other chest pain: Secondary | ICD-10-CM | POA: Diagnosis not present

## 2016-01-09 DIAGNOSIS — Z91040 Latex allergy status: Secondary | ICD-10-CM | POA: Insufficient documentation

## 2016-01-09 DIAGNOSIS — J45909 Unspecified asthma, uncomplicated: Secondary | ICD-10-CM | POA: Diagnosis not present

## 2016-01-09 DIAGNOSIS — Z87891 Personal history of nicotine dependence: Secondary | ICD-10-CM | POA: Diagnosis not present

## 2016-01-09 DIAGNOSIS — R079 Chest pain, unspecified: Secondary | ICD-10-CM | POA: Diagnosis present

## 2016-01-09 DIAGNOSIS — R091 Pleurisy: Secondary | ICD-10-CM | POA: Diagnosis not present

## 2016-01-09 HISTORY — DX: Unspecified asthma, uncomplicated: J45.909

## 2016-01-09 LAB — CBC WITH DIFFERENTIAL/PLATELET
BASOS ABS: 0 10*3/uL (ref 0–0.1)
BASOS PCT: 0 %
Eosinophils Absolute: 0 10*3/uL (ref 0–0.7)
Eosinophils Relative: 0 %
HEMATOCRIT: 42.4 % (ref 35.0–47.0)
HEMOGLOBIN: 14.4 g/dL (ref 12.0–16.0)
Lymphocytes Relative: 10 %
Lymphs Abs: 0.5 10*3/uL — ABNORMAL LOW (ref 1.0–3.6)
MCH: 28.9 pg (ref 26.0–34.0)
MCHC: 33.9 g/dL (ref 32.0–36.0)
MCV: 85.2 fL (ref 80.0–100.0)
Monocytes Absolute: 0.1 10*3/uL — ABNORMAL LOW (ref 0.2–0.9)
Monocytes Relative: 1 %
NEUTROS ABS: 5 10*3/uL (ref 1.4–6.5)
NEUTROS PCT: 89 %
Platelets: 205 10*3/uL (ref 150–440)
RBC: 4.97 MIL/uL (ref 3.80–5.20)
RDW: 13.7 % (ref 11.5–14.5)
WBC: 5.6 10*3/uL (ref 3.6–11.0)

## 2016-01-09 LAB — BASIC METABOLIC PANEL
ANION GAP: 7 (ref 5–15)
BUN: 16 mg/dL (ref 6–20)
CALCIUM: 9 mg/dL (ref 8.9–10.3)
CHLORIDE: 107 mmol/L (ref 101–111)
CO2: 23 mmol/L (ref 22–32)
Creatinine, Ser: 0.54 mg/dL (ref 0.44–1.00)
GFR calc non Af Amer: >60 mL/min (ref >60)
Glucose, Bld: 157 mg/dL — ABNORMAL HIGH (ref 65–99)
Potassium: 3.9 mmol/L (ref 3.5–5.1)
SODIUM: 137 mmol/L (ref 135–145)

## 2016-01-09 LAB — TROPONIN I

## 2016-01-09 LAB — POC URINE PREG, ED: PREG TEST UR: NEGATIVE

## 2016-01-09 LAB — FIBRIN DERIVATIVES D-DIMER (ARMC ONLY): Fibrin derivatives D-dimer (ARMC): 163 (ref 0–499)

## 2016-01-09 MED ORDER — SODIUM CHLORIDE 0.9 % IV BOLUS (SEPSIS)
1000.0000 mL | Freq: Once | INTRAVENOUS | Status: AC
  Administered 2016-01-09: 1000 mL via INTRAVENOUS

## 2016-01-09 MED ORDER — KETOROLAC TROMETHAMINE 30 MG/ML IJ SOLN
30.0000 mg | Freq: Once | INTRAMUSCULAR | Status: AC
  Administered 2016-01-09: 30 mg via INTRAVENOUS
  Filled 2016-01-09: qty 1

## 2016-01-09 NOTE — ED Triage Notes (Signed)
Patient reports having chest pain X several days. States "I feel like someone is sitting on my chest and I cannot catch my breath. Patient has HX asthma and epilepsy. A&O x4. Patient ambulated to ED room with NAD noted

## 2016-01-09 NOTE — ED Provider Notes (Signed)
[facility] Emergency Department Provider Note   ____________________________________________   First MD Initiated Contact with Patient 01/09/16 1507     (approximate)  I have reviewed the triage vital signs and the nursing notes.   HISTORY  Chief Complaint Chest Pain   HPI Grace Norris is a 25 y.o. female with a history of endometriosis as well as epilepsy and migraines on Keppra who is presenting to the emergency department for chest pain across the front of her chest. Says the pain is 9 out of 10 and has been ongoing over the past 3 days. She says the pain is constant and radiates to her back. She says it is worse with lying down as well as coughing. Says that also deep breaths hurt her chest. She does not report any nausea vomiting or diaphoresis. She says that she went to [facility] this past Monday was diagnosed with an upper respiratory infection and given an antibiotic which she does not remember the name of. She is also not taking anything for pain control at home. She says that she also has a known sick contact that she was exposed to one week ago with upper respiratory infection. She is concerned about pneumonia. Furthermore, she was started on an estrogen containing birth control pill, quartet.  She has a documented allergy to ibuprofen but says she has tolerated Toradol in the past.  Past Medical History:  Diagnosis Date  . Asthma   . Epilepsy ([place])   . Migraines     Patient Active Problem List   Diagnosis Date Noted  . Family history of cancer 10/12/2015  . Pelvic pain in female 10/12/2015    Past Surgical History:  Procedure Laterality Date  . HAND TENDON SURGERY Bilateral     Prior to Admission medications   Medication Sig Start Date End Date Taking? Authorizing Provider  HYDROcodone-acetaminophen (NORCO/VICODIN) 5-325 MG tablet Take 1 tablet by mouth every 6 (six) hours as needed. 12/24/15   Rubie Maid, MD  levETIRAcetam  (KEPPRA) 250 MG tablet Take 250-500 mg by mouth 2 (two) times daily. 250 mg am and 500mg  at 1900    Historical Provider, MD  Levonorgest-Eth Est & Eth Est (QUARTETTE) 42-21-21-7 DAYS TABS Take 1 tablet by mouth daily. 12/31/15   Rubie Maid, MD    Allergies Aspartame; Prednisone; Ibuprofen; Latex; Oxycodone; and Penicillins  Family History  Problem Relation Age of Onset  . Scoliosis Brother   . Fibromyalgia Mother   . Cancer Mother 47    ovarian; s/p partial hysterectomy with RSO  . Alzheimer's disease Other   . Cancer Maternal Grandmother 30    breast cancer (unilateral lumpectomy)  . Breast cancer Maternal Grandmother     Social History Social History  Substance Use Topics  . Smoking status: Former Research scientist (life sciences)  . Smokeless tobacco: Never Used  . Alcohol use No    Review of Systems Constitutional: No fever/chills Eyes: No visual changes. ENT: No sore throat. Cardiovascular: As above Respiratory: As above Gastrointestinal: No abdominal pain.  No nausea, no vomiting.  No diarrhea.  No constipation. Genitourinary: Negative for dysuria. Musculoskeletal: Negative for back pain. Skin: Negative for rash. Neurological: Negative for headaches, focal weakness or numbness.  10-point ROS otherwise negative.  ____________________________________________   PHYSICAL EXAM:  VITAL SIGNS: ED Triage Vitals  Enc Vitals Group     BP 01/09/16 1505 129/68     Pulse Rate 01/09/16 1505 95     Resp 01/09/16 1505 16  Temp 01/09/16 1508 98.4 F (36.9 C)     Temp Source 01/09/16 1508 Oral     SpO2 01/09/16 1505 100 %     Weight 01/09/16 1505 123 lb (55.8 kg)     Height 01/09/16 1505 5\' 4"  (1.626 m)     Head Circumference --      Peak Flow --      Pain Score 01/09/16 1505 9     Pain Loc --      Pain Edu? --      Excl. in [place]? --     Constitutional: Alert and oriented. Well appearing and in no acute distress. Eyes: Conjunctivae are normal. PERRL. EOMI. Head: Atraumatic. Nose: No  congestion/rhinnorhea. Mouth/Throat: Mucous membranes are moist.   Neck: No stridor.   Cardiovascular: Normal rate, regular rhythm. Grossly normal heart sounds.  Good peripheral circulation With intact and equal pulses in the radial as well as her Salas pedis pulses. Also, the chest pain is reproducible with palpation throughout. Respiratory: Normal respiratory effort.  No retractions. Lungs CTAB. Gastrointestinal: Soft and nontender. No distention. No CVA tenderness. Musculoskeletal: No lower extremity tenderness nor edema.  No joint effusions. Neurologic:  Normal speech and language. No gross focal neurologic deficits are appreciated. No gait instability. Skin:  Skin is warm, dry and intact. No rash noted. Psychiatric: Mood and affect are normal. Speech and behavior are normal.  ____________________________________________   LABS (all labs ordered are listed, but only abnormal results are displayed)  Labs Reviewed  CBC WITH DIFFERENTIAL/PLATELET - Abnormal; Notable for the following:       Result Value   Lymphs Abs 0.5 (*)    Monocytes Absolute 0.1 (*)    All other components within normal limits  BASIC METABOLIC PANEL - Abnormal; Notable for the following:    Glucose, Bld 157 (*)    All other components within normal limits  TROPONIN I  FIBRIN DERIVATIVES D-DIMER ([facility] ONLY)  POC URINE PREG, ED   ____________________________________________  EKG  ED ECG REPORT I, Doran Stabler, the attending physician, personally viewed and interpreted this ECG.   Date: 01/09/2016  EKG Time: 1456  Rate: 106  Rhythm: sinus tachycardia  Axis: Normal  Intervals:none  ST&T Change: No ST segment elevation or depression. No abnormal T-wave inversions. Biphasic T-wave in lead V2 is likely related to lead placement. No significant change from the EKG of fibroid 22nd 2017.  ____________________________________________  RADIOLOGY  DG Chest 2 View (Accession [id number]) (Order [id number])   Imaging  Date: 01/09/2016 Department: [facility] EMERGENCY DEPARTMENT Released By/Authorizing: Orbie Pyo, MD (auto-released)  PACS Images   Show images for DG Chest 2 View  Study Result   CLINICAL DATA:  25 year old female with several day history of chest pain and shortness of breath  EXAM: CHEST  2 VIEW  COMPARISON:  Prior chest x-ray 07/24/2015  FINDINGS: The lungs are clear and negative for focal airspace consolidation, pulmonary edema or suspicious pulmonary nodule. No pleural effusion or pneumothorax. Cardiac and mediastinal contours are within normal limits. No acute fracture or lytic or blastic osseous lesions. The visualized upper abdominal bowel gas pattern is unremarkable.  IMPRESSION: Negative chest x-ray.   Electronically Signed   By: Jacqulynn Cadet M.D.   On: 01/09/2016 15:31     ____________________________________________   PROCEDURES  Procedure(s) performed:   Procedures  Critical Care performed:   ____________________________________________   INITIAL IMPRESSION / ASSESSMENT AND PLAN / ED COURSE  Pertinent labs &  imaging results that were available during my care of the patient were reviewed by me and considered in my medical decision making (see chart for details).  ----------------------------------------- 5:29 PM on 01/09/2016 -----------------------------------------  Patient resting comfortably without any distress. Still rates her pain as 9 out of 10. However, very reassuring workup. Likely pleurisy and musculoskeletal chest pain likely related to her upper respiratory infection. Will be discharged home. Explained to the patient the plan and she is understanding of the plan and willing to comply.Very likely to be serious cause such as pulmonary embolus or aortic catastrophe. Patient will continue to take her antibiotics for prescribed urgent care.  Clinical Course      ____________________________________________   FINAL CLINICAL IMPRESSION(S) / ED DIAGNOSES  Chest wall pain. Pleurisy.    NEW MEDICATIONS STARTED DURING THIS VISIT:  New Prescriptions   No medications on file     Note:  This document was prepared using Dragon voice recognition software and may include unintentional dictation errors.    Orbie Pyo, MD 01/09/16 [phone]

## 2016-01-11 DIAGNOSIS — M654 Radial styloid tenosynovitis [de Quervain]: Secondary | ICD-10-CM | POA: Insufficient documentation

## 2016-01-16 ENCOUNTER — Encounter: Payer: Self-pay | Admitting: Obstetrics and Gynecology

## 2016-01-16 ENCOUNTER — Ambulatory Visit (INDEPENDENT_AMBULATORY_CARE_PROVIDER_SITE_OTHER): Payer: Medicare Other | Admitting: Obstetrics and Gynecology

## 2016-01-16 VITALS — BP 114/77 | HR 118 | Ht 64.0 in | Wt 120.0 lb

## 2016-01-16 DIAGNOSIS — N941 Unspecified dyspareunia: Secondary | ICD-10-CM

## 2016-01-16 DIAGNOSIS — Z809 Family history of malignant neoplasm, unspecified: Secondary | ICD-10-CM | POA: Diagnosis not present

## 2016-01-16 DIAGNOSIS — Z124 Encounter for screening for malignant neoplasm of cervix: Secondary | ICD-10-CM | POA: Diagnosis not present

## 2016-01-16 DIAGNOSIS — R102 Pelvic and perineal pain: Secondary | ICD-10-CM | POA: Diagnosis not present

## 2016-01-16 DIAGNOSIS — N342 Other urethritis: Secondary | ICD-10-CM | POA: Diagnosis not present

## 2016-01-16 DIAGNOSIS — Z01419 Encounter for gynecological examination (general) (routine) without abnormal findings: Secondary | ICD-10-CM

## 2016-01-16 DIAGNOSIS — D229 Melanocytic nevi, unspecified: Secondary | ICD-10-CM
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
## 2016-01-20 ENCOUNTER — Encounter: Payer: Self-pay | Admitting: Obstetrics and Gynecology

## 2016-01-20 LAB — CT NG TV HSV BY NAA
Chlamydia by NAA: NEGATIVE
GONOCOCCUS BY NAA: NEGATIVE
HSV 1 NAA: NEGATIVE
HSV 2 NAA: NEGATIVE
TRICH VAG BY NAA: NEGATIVE

## 2016-01-21 ENCOUNTER — Encounter: Payer: Self-pay | Admitting: Obstetrics and Gynecology

## 2016-01-21 ENCOUNTER — Telehealth: Payer: Self-pay | Admitting: Obstetrics and Gynecology

## 2016-01-21 LAB — PAP IG, CT-NG, RFX HPV ASCU
CHLAMYDIA, NUC. ACID AMP: NEGATIVE
GONOCOCCUS BY NUCLEIC ACID AMP: NEGATIVE
PAP Smear Comment: 0

## 2016-01-21 NOTE — Telephone Encounter (Signed)
Patient called requesting results from her most resent labs.

## 2016-01-22 NOTE — Telephone Encounter (Signed)
Responded to pt via mychart

## 2016-02-11 ENCOUNTER — Emergency Department
Admission: EM | Admit: 2016-02-11 | Discharge: 2016-02-11 | Disposition: A | Discharge status: Home / Self Care | Payer: Medicare Other | Attending: Emergency Medicine | Admitting: Emergency Medicine

## 2016-02-11 ENCOUNTER — Encounter: Payer: Self-pay | Admitting: Emergency Medicine

## 2016-02-11 DIAGNOSIS — J069 Acute upper respiratory infection, unspecified: Secondary | ICD-10-CM | POA: Diagnosis not present

## 2016-02-11 DIAGNOSIS — J45909 Unspecified asthma, uncomplicated: Secondary | ICD-10-CM | POA: Insufficient documentation

## 2016-02-11 DIAGNOSIS — Z87891 Personal history of nicotine dependence: Secondary | ICD-10-CM | POA: Insufficient documentation

## 2016-02-11 DIAGNOSIS — B9789 Other viral agents as the cause of diseases classified elsewhere: Secondary | ICD-10-CM

## 2016-02-11 DIAGNOSIS — R05 Cough: Secondary | ICD-10-CM | POA: Diagnosis present

## 2016-02-11 LAB — URINALYSIS COMPLETE WITH MICROSCOPIC (ARMC ONLY)
BILIRUBIN URINE: NEGATIVE
Glucose, UA: NEGATIVE mg/dL
HGB URINE DIPSTICK: NEGATIVE
Ketones, ur: NEGATIVE mg/dL
LEUKOCYTES UA: NEGATIVE
NITRITE: NEGATIVE
PH: 6 (ref 5.0–8.0)
Protein, ur: NEGATIVE mg/dL
SPECIFIC GRAVITY, URINE: 1.029 (ref 1.005–1.030)

## 2016-02-11 LAB — POCT RAPID STREP A: STREPTOCOCCUS, GROUP A SCREEN (DIRECT): NEGATIVE

## 2016-02-11 LAB — POCT PREGNANCY, URINE: PREG TEST UR: NEGATIVE

## 2016-02-11 MED ORDER — LIDOCAINE VISCOUS 2 % MT SOLN: 5.0000 mL | Freq: Four times a day (QID) | OROMUCOSAL | 0 refills | Status: DC | PRN

## 2016-02-11 MED ORDER — PSEUDOEPH-BROMPHEN-DM 30-2-10 MG/5ML PO SYRP: 5.0000 mL | ORAL_SOLUTION | Freq: Four times a day (QID) | ORAL | 0 refills | Status: DC | PRN

## 2016-02-11 NOTE — ED Notes (Signed)
See triage note   States she has had fever sore throat and non prod cough for about 6 days   Afebrile on arrival

## 2016-02-11 NOTE — ED Triage Notes (Signed)
C/O  Cough and URI symptoms x 1 weeks.  Also c/o fever.  Has been taking tylenol cold for symptoms.  Patient has history of epilepsy.

## 2016-02-11 NOTE — ED Provider Notes (Signed)
[facility] Emergency Department Provider Note   ____________________________________________   First MD Initiated Contact with Patient 02/11/16 1731     (approximate)  I have reviewed the triage vital signs and the nursing notes.   HISTORY  Chief Complaint URI    HPI Grace Norris is a 25 y.o. female patient complaining of 1 week of URI signs and symptoms consistent sore throat cough intermittent nasal congestion. Patient also states she has intermittent fever. Patient states she's taking Tylenol over-the-counter cough medication with only mild relief.Patient rates her pain as a 0. Although she state she has a sore throat. No palliative measures for complaint.   Past Medical History:  Diagnosis Date  . Asthma   . Epilepsy ([place])   . Migraines     Patient Active Problem List   Diagnosis Date Noted  . Family history of cancer 10/12/2015  . Pelvic pain in female 10/12/2015    Past Surgical History:  Procedure Laterality Date  . HAND TENDON SURGERY Bilateral     Prior to Admission medications   Medication Sig Start Date End Date Taking? Authorizing Provider  brompheniramine-pseudoephedrine-DM 30-2-10 MG/5ML syrup Take 5 mLs by mouth 4 (four) times daily as needed. Mixed with 5 mL of viscous lidocaine swish and swallow. 02/11/16   Sable Feil, PA-C  levETIRAcetam (KEPPRA) 250 MG tablet Take 250-500 mg by mouth 2 (two) times daily. 250 mg am and 500mg  at 1900    Historical Provider, MD  Levonorgest-Eth Est & Eth Est (QUARTETTE) 42-21-21-7 DAYS TABS Take 1 tablet by mouth daily. 12/31/15   Rubie Maid, MD  lidocaine (XYLOCAINE) 2 % solution Use as directed 5 mLs in the mouth or throat every 6 (six) hours as needed for mouth pain. Mixed with 5 mL of Bromfed-DM. Patient swallow. 02/11/16   Sable Feil, PA-C    Allergies Aspartame; Prednisone; Ibuprofen; Latex; Oxycodone; and Penicillins  Family History  Problem Relation Age of Onset  .  Scoliosis Brother   . Fibromyalgia Mother   . Cancer Mother 32    ovarian; s/p partial hysterectomy with RSO  . Graves' disease Mother   . Alzheimer's disease Other   . Cancer Maternal Grandmother 30    breast cancer (unilateral lumpectomy)  . Breast cancer Maternal Grandmother     Social History Social History  Substance Use Topics  . Smoking status: Former Research scientist (life sciences)  . Smokeless tobacco: Never Used  . Alcohol use No    Review of Systems Constitutional: No fever/chills Eyes: No visual changes. ENT: Sore throat Cardiovascular: Denies chest pain. Respiratory: Denies shortness of breath. Productive cough Gastrointestinal: No abdominal pain.  Nausea without vomiting.  Diarrhea.  No constipation. Genitourinary: Negative for dysuria. Musculoskeletal: Negative for back pain. Skin: Negative for rash. Neurological: Negative for headaches, focal weakness or numbness.Epilepsy Allergic/Immunilogical: See medication list ____________________________________________   PHYSICAL EXAM:  VITAL SIGNS: ED Triage Vitals  Enc Vitals Group     BP 02/11/16 1723 (!) 155/78     Pulse Rate 02/11/16 1723 93     Resp 02/11/16 1723 16     Temp 02/11/16 1723 98.2 F (36.8 C)     Temp Source 02/11/16 1723 Oral     SpO2 02/11/16 1723 97 %     Weight 02/11/16 1723 120 lb (54.4 kg)     Height 02/11/16 1723 5\' 4"  (1.626 m)     Head Circumference --      Peak Flow --  Pain Score 02/11/16 1724 0     Pain Loc --      Pain Edu? --      Excl. in [place]? --     Constitutional: Alert and oriented. Well appearing and in no acute distress. Eyes: Conjunctivae are normal. PERRL. EOMI. Head: Atraumatic. Nose: No congestion/rhinnorhea. Mouth/Throat: Mucous membranes are moist.  Oropharynx non-erythematous. Neck: No stridor.  No cervical spine tenderness to palpation. Hematological/Lymphatic/Immunilogical: No cervical lymphadenopathy. Cardiovascular: Normal rate, regular rhythm. Grossly normal heart  sounds.  Good peripheral circulation. Respiratory: Normal respiratory effort.  No retractions. Lungs CTAB. Gastrointestinal: Soft and nontender. No distention. No abdominal bruits. No CVA tenderness. Musculoskeletal: No lower extremity tenderness nor edema.  No joint effusions. Neurologic:  Normal speech and language. No gross focal neurologic deficits are appreciated. No gait instability. Skin:  Skin is warm, dry and intact. No rash noted. Psychiatric: Mood and affect are normal. Speech and behavior are normal.  ____________________________________________   LABS (all labs ordered are listed, but only abnormal results are displayed)  Labs Reviewed  URINALYSIS COMPLETEWITH MICROSCOPIC ([place] ONLY) - Abnormal; Notable for the following:       Result Value   Color, Urine YELLOW (*)    APPearance CLEAR (*)    Bacteria, UA RARE (*)    Squamous Epithelial / LPF 0-5 (*)    All other components within normal limits  CULTURE, GROUP A STREP ([place])  POC URINE PREG, ED  POCT RAPID STREP A  POCT PREGNANCY, URINE   ____________________________________________  EKG   ____________________________________________  RADIOLOGY   ____________________________________________   PROCEDURES  Procedure(s) performed: None  Procedures  Critical Care performed: No  ____________________________________________   INITIAL IMPRESSION / ASSESSMENT AND PLAN / ED COURSE  Pertinent labs & imaging results that were available during my care of the patient were reviewed by me and considered in my medical decision making (see chart for details).  Upper respiratory infection. Discussed negative rapid strep test and urinalysis patient. 5 strep culture is pending. Patient given discharge care instructions. Patient given prescription for Bromfed-DM and viscous lidocaine. Patient advised use Tylenol for fever pain.. Patient advised follow-up [facility] if condition persists  Clinical Course      ____________________________________________   FINAL CLINICAL IMPRESSION(S) / ED DIAGNOSES  Final diagnoses:  Viral URI with cough      NEW MEDICATIONS STARTED DURING THIS VISIT:  New Prescriptions   BROMPHENIRAMINE-PSEUDOEPHEDRINE-DM 30-2-10 MG/5ML SYRUP    Take 5 mLs by mouth 4 (four) times daily as needed. Mixed with 5 mL of viscous lidocaine swish and swallow.   LIDOCAINE (XYLOCAINE) 2 % SOLUTION    Use as directed 5 mLs in the mouth or throat every 6 (six) hours as needed for mouth pain. Mixed with 5 mL of Bromfed-DM. Patient swallow.     Note:  This document was prepared using Dragon voice recognition software and may include unintentional dictation errors.    Sable Feil, PA-C 02/11/16 [place], MD 02/15/16 [phone]

## 2016-02-11 NOTE — Discharge Instructions (Signed)
Advised Tylenol for fever/ pain.

## 2016-02-14 LAB — CULTURE, GROUP A STREP (THRC)

## 2016-02-25 ENCOUNTER — Encounter: Payer: Self-pay | Admitting: Obstetrics and Gynecology

## 2016-03-02 DIAGNOSIS — R200 Anesthesia of skin: Secondary | ICD-10-CM | POA: Insufficient documentation

## 2016-03-03 ENCOUNTER — Encounter: Payer: Self-pay | Admitting: Obstetrics and Gynecology

## 2016-03-03 ENCOUNTER — Ambulatory Visit (INDEPENDENT_AMBULATORY_CARE_PROVIDER_SITE_OTHER): Payer: Medicare Other | Admitting: Obstetrics and Gynecology

## 2016-03-03 ENCOUNTER — Telehealth: Payer: Self-pay | Admitting: Obstetrics and Gynecology

## 2016-03-03 VITALS — BP 143/84 | HR 102 | Ht 64.0 in | Wt 124.4 lb

## 2016-03-03 DIAGNOSIS — G8929 Other chronic pain: Secondary | ICD-10-CM

## 2016-03-03 DIAGNOSIS — N926 Irregular menstruation, unspecified: Secondary | ICD-10-CM

## 2016-03-03 DIAGNOSIS — N921 Excessive and frequent menstruation with irregular cycle: Secondary | ICD-10-CM

## 2016-03-03 DIAGNOSIS — R102 Pelvic and perineal pain unspecified side: Secondary | ICD-10-CM

## 2016-03-03 DIAGNOSIS — N946 Dysmenorrhea, unspecified: Secondary | ICD-10-CM

## 2016-03-03 DIAGNOSIS — R030 Elevated blood-pressure reading, without diagnosis of hypertension: Secondary | ICD-10-CM | POA: Diagnosis not present

## 2016-03-03 NOTE — H&P (Signed)
GYNECOLOGY PRE-OPERATIVE HISTORY AND PHYSICAL  Subjective:    Patient is a 25 y.o. G53P0010 female scheduled for operative laparoscopy with biopsies. Indications for procedure are pelvic pain, breakthrough bleeding on OCP, h/o irregular menses, and dysmenorrhea.   Pertinent Gynecological History: Menses: flow is moderate and with moderate dysmenorrhea and passage of "quarter sized" clots.  Cycles last from 7-20 days. Contraception: OCP (estrogen/progesterone), continuous regimen Last pap: normal Date: 12/2015  Discussed Blood/Blood Products: yes   Menstrual History: OB History    Gravida Para Term Preterm AB Living   0 0 0 0 0 0   SAB TAB Ectopic Multiple Live Births   0 0 0 0        Menarche age: 81 Patient's last menstrual period was 01/09/2016.    Past Medical History:  Diagnosis Date  . Asthma   . Epilepsy ([place])   . Migraines     Past Surgical History:  Procedure Laterality Date  . HAND TENDON SURGERY Bilateral     OB History  Gravida Para Term Preterm AB Living  1 0 0 0 1 0  SAB TAB Ectopic Multiple Live Births  1 0 0 0      # Outcome Date GA Lbr Len/2nd Weight Sex Delivery Anes PTL Lv  1 SAB 2016              Social History   Social History  . Marital status: Single    Spouse name: N/A  . Number of children: N/A  . Years of education: N/A   Social History Main Topics  . Smoking status: Former Research scientist (life sciences)  . Smokeless tobacco: Never Used  . Alcohol use No  . Drug use: No  . Sexual activity: Yes    Birth control/ protection: Pill   Other Topics Concern  . Not on file   Social History Narrative  . No narrative on file    Family History  Problem Relation Age of Onset  . Scoliosis Brother   . Fibromyalgia Mother   . Cancer Mother 53    ovarian; s/p partial hysterectomy with RSO  . Graves' disease Mother   . Alzheimer's disease Other   . Cancer Maternal Grandmother 30    breast cancer (unilateral lumpectomy)  . Breast cancer Maternal  Grandmother     Current Outpatient Prescriptions on File Prior to Visit  Medication Sig Dispense Refill  . levETIRAcetam (KEPPRA) 250 MG tablet Take 250-500 mg by mouth 2 (two) times daily. 250 mg am and 500mg  at 1900    . Levonorgest-Eth Est & Eth Est (QUARTETTE) 42-21-21-7 DAYS TABS Take 1 tablet by mouth daily. 91 tablet 3   No current facility-administered medications on file prior to visit.      Allergies  Allergen Reactions  . Aspartame     Other reaction(s): Other (See Comments) Seizures  . Prednisone Nausea And Vomiting    Other reaction(s): Vomiting  . Ibuprofen Nausea And Vomiting and Rash    Other reaction(s): Nausea And Vomiting  . Latex Rash  . Oxycodone Rash  . Penicillins Other (See Comments)    Other reaction(s): Other (See Comments) Patient reports seizures Other reaction(s): Other (See Comments) Seizures Seizures Unknown if it caused rash,swelling, shortness of breath or skin necrosis. Patient says she has not had a reaction in past ten years. Has not taken Penicillin since diagnosed with epilepsy.    Review of Systems Constitutional: No recent fever/chills/sweats Respiratory: No recent cough/bronchitis Cardiovascular: No chest pain Gastrointestinal: No recent  nausea/vomiting/diarrhea Genitourinary: No UTI symptoms.  Positive for irregular painful menses Hematologic/lymphatic:No history of coagulopathy or recent blood thinner use    Objective:    BP (!) 143/84 (BP Location: Left Arm, Patient Position: Sitting, Cuff Size: Normal)   Pulse (!) 102   Ht 5\' 4"  (1.626 m)   Wt 124 lb 6.4 oz (56.4 kg)   LMP 01/09/2016   BMI 21.35 kg/m   General:   Normal  Skin:   normal  HEENT:  Normal  Neck:  Supple without Adenopathy or Thyromegaly  Lungs:   Heart:              Breasts:   Abdomen:  Pelvis:  M/S   Extremeties:  Neuro:    clear to auscultation bilaterally   Normal without murmur   Not Examined   soft, non-tender; bowel sounds normal; no  masses,  no organomegaly   Exam deferred to OR  No CVAT  Warm/Dry   Normal          Assessment:   Persistent pelvic pain Irregular menses Breakthrough bleeding on OCPs Dysmenorrhea Elevated BPs   Plan:   - Discussion previously had with patient that based on prior symptoms it is suspected that she may have endometriosis. Discussed alternative management options including changing from pills to a different type of hormonal control. Also discussed possibility of performing a laparoscopy to confirm diagnosis prior to initiation of other forms of medication, this patient has been on at least 3-4 types of birth control pills in the past several months with no relief of symptoms. Patient desires to have laparoscopy performed before proceeding with further management. - Counseling: Procedure, risks, reasons, benefits and complications (including injury to bowel, bladder, major blood vessel, ureter, bleeding, possibility of transfusion, infection, or fistula formation) reviewed in detail.   - The postoperative expectations were also discussed in detail, including the possibility of resolution or improvement of symptoms, or no change in symptoms still requiring some form of hormonal intervention - Preop testing ordered. Instructions reviewed, including NPO after midnight.   Rubie Maid, MD [facility]

## 2016-03-03 NOTE — Progress Notes (Signed)
    GYNECOLOGY PROGRESS NOTE  Subjective:    Patient ID: Grace Norris, female    DOB: [birth year], 25 y.o.   MRN: [id number]  HPI  Patient is a 25 y.o. G0P0 female who presents for complaints of continued breakthrough bleeding on OCPs. Patient also still noticing moderate dysmenorrhea. Notes that most recent period has lasted approximately 19 days. In addition reports that she is passing quarter-sized clots and that the flow is getting heavier. Is taking continuous cycle combined OCPs  for irregular menses and pelvic pain.  The following portions of the patient's history were reviewed and updated as appropriate: allergies, current medications, past family history, past medical history, past social history, past surgical history and problem list.  Review of Systems Pertinent items noted in HPI and remainder of comprehensive ROS otherwise negative.   Objective:   Blood pressure (!) 143/84, pulse (!) 102, height 5\' 4"  (1.626 m), weight 124 lb 6.4 oz (56.4 kg), last menstrual period 01/09/2016. General appearance: alert and no distress Exam deferred.    Assessment:   Persistent pelvic pain Irregular menses Breakthrough bleeding on OCPs Dysmenorrhea Elevated BPs  Plan:   Discussion previously had with patient that based on prior symptoms it is suspected that she may have endometriosis. Discussed alternative management options including changing from pills to a different type of hormonal control. Also discussed possibility of performing a laparoscopy to confirm diagnosis prior to initiation of other forms of medication, this patient has been on at least 3-4 types of birth control pills in the past several months with no relief of symptoms. Patient desires to have laparoscopy performed before proceeding with further management. Will schedule patient for diagnostic laparoscopy with biopsies. Patient scheduled for 03/09/2016.   The risks of surgery were discussed in detail with the patient  including but not limited to: bleeding which may require transfusion or reoperation; infection which may require prolonged hospitalization or re-hospitalization and antibiotic therapy; injury to bowel, bladder, ureters and major vessels or other surrounding organs; need for additional procedures including laparotomy; thromboembolic phenomenon, incisional problems and other postoperative or anesthesia complications. The postoperative expectations were also discussed in detail, including the possibility of resolution or improvement of symptoms, or no change in symptoms still requiring some form of hormonal intervention. The patient also understands the alternative treatment options which were discussed in full. All questions were answered.  She was told that she will be contacted by our surgical scheduler regarding the time and date of her surgery; routine preoperative instructions of having nothing to eat or drink after midnight on the day prior to surgery and also coming to the hospital 1.5 hours prior to her time of surgery were also emphasized.  She was told she may be called for a preoperative appointment some time this week and will be given further preoperative instructions at that visit. Printed patient education handouts about the procedure were given to the patient to review at home. - Patient with elevated BP today, no prior h/o HTN.  No risk factors. Will continue to monitor.    A total of 25 minutes were spent face-to-face with the patient during this encounter and over half of that time involved counseling and coordination of care.

## 2016-03-03 NOTE — Patient Instructions (Addendum)
You are scheduled for surgery on 03/09/2016.  Nothing to eat after midnight on day prior to surgery.  Do not take any medications unless recommended by your provider on day prior to surgery.  Do not take NSAIDs (Motrin, Aleve) or aspirin 7 days prior to surgery.  You may take Tylenol products for minor aches and pains.  You will receive a prescription for pain medications post-operatively.  You will be contacted by phone approximately 1 week prior to surgery to schedule pre-operative appointment.  Please call the office if you have any questions regarding your upcoming surgery.   Pelvic Pain, Female Female pelvic pain can be caused by many different things and start from a variety of places. Pelvic pain refers to pain that is located in the lower half of the abdomen and between your hips. The pain may occur over a short period of time (acute) or may be reoccurring (chronic). The cause of pelvic pain may be related to disorders affecting the female reproductive organs (gynecologic), but it may also be related to the bladder, kidney stones, an intestinal complication, or muscle or skeletal problems. Getting help right away for pelvic pain is important, especially if there has been severe, sharp, or a sudden onset of unusual pain. It is also important to get help right away because some types of pelvic pain can be life threatening.  CAUSES  Below are only some of the causes of pelvic pain. The causes of pelvic pain can be in one of several categories.   Gynecologic.  Pelvic inflammatory disease.  Sexually transmitted infection.  Ovarian cyst or a twisted ovarian ligament (ovarian torsion).  Uterine lining that grows outside the uterus (endometriosis).  Fibroids, cysts, or tumors.  Ovulation.  Pregnancy.  Pregnancy that occurs outside the uterus (ectopic pregnancy).  Miscarriage.  Labor.  Abruption of the placenta or ruptured uterus.  Infection.  Uterine infection  (endometritis).  Bladder infection.  Diverticulitis.  Miscarriage related to a uterine infection (septic abortion).  Bladder.  Inflammation of the bladder (cystitis).  Kidney stone(s).  Gastrointestinal.  Constipation.  Diverticulitis.  Neurologic.  Trauma.  Feeling pelvic pain because of mental or emotional causes (psychosomatic).  Cancers of the bowel or pelvis. EVALUATION  Your caregiver will want to take a careful history of your concerns. This includes recent changes in your health, a careful gynecologic history of your periods (menses), and a sexual history. Obtaining your family history and medical history is also important. Your caregiver may suggest a pelvic exam. A pelvic exam will help identify the location and severity of the pain. It also helps in the evaluation of which organ system may be involved. In order to identify the cause of the pelvic pain and be properly treated, your caregiver may order tests. These tests may include:   A pregnancy test.  Pelvic ultrasonography.  An X-ray exam of the abdomen.  A urinalysis or evaluation of vaginal discharge.  Blood tests. HOME CARE INSTRUCTIONS   Only take over-the-counter or prescription medicines for pain, discomfort, or fever as directed by your caregiver.   Rest as directed by your caregiver.   Eat a balanced diet.   Drink enough fluids to make your urine clear or pale yellow, or as directed.   Avoid sexual intercourse if it causes pain.   Apply warm or cold compresses to the lower abdomen depending on which one helps the pain.   Avoid stressful situations.   Keep a journal of your pelvic pain. Write down when it  started, where the pain is located, and if there are things that seem to be associated with the pain, such as food or your menstrual cycle.  Follow up with your caregiver as directed.  SEEK MEDICAL CARE IF:  Your medicine does not help your pain.  You have abnormal vaginal  discharge. SEEK IMMEDIATE MEDICAL CARE IF:   You have heavy bleeding from the vagina.   Your pelvic pain increases.   You feel light-headed or faint.   You have chills.   You have pain with urination or blood in your urine.   You have uncontrolled diarrhea or vomiting.   You have a fever or persistent symptoms for more than 3 days.  You have a fever and your symptoms suddenly get worse.   You are being physically or sexually abused.   This information is not intended to replace advice given to you by your health care provider. Make sure you discuss any questions you have with your health care provider.   Document Released: 04/14/2004 Document Revised: 02/06/2015 Document Reviewed: 09/07/2011 Elsevier Interactive Patient Education 2016 [employer] Diagnostic Laparoscopy A diagnostic laparoscopy is a procedure to diagnose diseases in the abdomen. During the procedure, a thin, lighted, pencil-sized instrument called a laparoscope is inserted into the abdomen through an incision. The laparoscope allows your health care provider to look at the organs inside your body. LET [facility] CARE PROVIDER KNOW ABOUT:  Any allergies you have.  All medicines you are taking, including vitamins, herbs, eye drops, creams, and over-the-counter medicines.  Previous problems you or members of your family have had with the use of anesthetics.  Any blood disorders you have.  Previous surgeries you have had.  Medical conditions you have. RISKS AND COMPLICATIONS  Generally, this is a safe procedure. However, problems can occur, which may include:  Infection.  Bleeding.  Damage to other organs.  Allergic reaction to the anesthetics used during the procedure. BEFORE THE PROCEDURE  Do not eat or drink anything after midnight on the night before the procedure or as directed by your health care provider.  Ask your health care provider about:  Changing or stopping your regular  medicines.  Taking medicines such as aspirin and ibuprofen. These medicines can thin your blood. Do not take these medicines before your procedure if your health care provider instructs you not to.  Plan to have someone take you home after the procedure. PROCEDURE  You may be given a medicine to help you relax (sedative).  You will be given a medicine to make you sleep (general anesthetic).  Your abdomen will be inflated with a gas. This will make your organs easier to see.  Small incisions will be made in your abdomen.  A laparoscope and other small instruments will be inserted into the abdomen through the incisions.  A tissue sample may be removed from an organ in the abdomen for examination.  The instruments will be removed from the abdomen.  The gas will be released.  The incisions will be closed with stitches (sutures). AFTER THE PROCEDURE  Your blood pressure, heart rate, breathing rate, and blood oxygen level will be monitored often until the medicines you were given have worn off.   This information is not intended to replace advice given to you by your health care provider. Make sure you discuss any questions you have with your health care provider.   Document Released: 08/24/2000 Document Revised: 02/06/2015 Document Reviewed: 12/29/2013 Elsevier Interactive Patient Education [employer].

## 2016-03-03 NOTE — Telephone Encounter (Signed)
Responded to pt via mychart

## 2016-03-03 NOTE — Telephone Encounter (Signed)
This pt called and was trying to ask me about some medication. She was very rude and screamed at me. She wants to know if she is supposed to take her seizure medication the morning of her surgery.

## 2016-03-04 ENCOUNTER — Encounter: Payer: Self-pay | Admitting: Obstetrics and Gynecology

## 2016-03-04 NOTE — Patient Instructions (Addendum)
  Your procedure is scheduled on: 03-09-16 [facility]) Report to Same Day Surgery 2nd floor medical mall To find out your arrival time please call [phone] between [place] on 03-06-16 (FRIDAY)  Remember: Instructions that are not followed completely may result in serious medical risk, up to and including death, or upon the discretion of your surgeon and anesthesiologist your surgery may need to be rescheduled.    _x___ 1. Do not eat food or drink liquids after midnight. No gum chewing or hard candies.     __x__ 2. No Alcohol for 24 hours before or after surgery.   __x__3. No Smoking for 24 prior to surgery.   ____  4. Bring all medications with you on the day of surgery if instructed.    __x__ 5. Notify your doctor if there is any change in your medical condition     (cold, fever, infections).     Do not wear jewelry, make-up, hairpins, clips or nail polish.  Do not wear lotions, powders, or perfumes. You may wear deodorant.  Do not shave 48 hours prior to surgery. Men may shave face and neck.  Do not bring valuables to the hospital.    [facility] is not responsible for any belongings or valuables.               Contacts, dentures or bridgework may not be worn into surgery.  Leave your suitcase in the car. After surgery it may be brought to your room.  For patients admitted to the hospital, discharge time is determined by your treatment team.   Patients discharged the day of surgery will not be allowed to drive home.    Please read over the following fact sheets that you were given:   [facility] Preparing for Surgery and or MRSA Information   _x___ Take these medicines the morning of surgery with A SIP OF WATER:    1. KEPPRA  2.  3.  4.  5.  6.  ____Fleets enema or Magnesium Citrate as directed.   _x___ Use CHG Soap or sage wipes as directed on instruction sheet   _X___ Use inhalers on the day of surgery and bring to hospital day of surgery-USE ALBUTEROL INHALER AT  [place]  ____ Stop metformin 2 days prior to surgery    ____ Take 1/2 of usual insulin dose the night before surgery and none on the morning of surgery.   ____ Stop aspirin or coumadin, or plavix  x__ Stop Anti-inflammatories such as Advil, Aleve, Ibuprofen, Motrin, Naproxen,          Naprosyn, Goodies powders or aspirin products. Ok to take Tylenol.   ____ Stop supplements until after surgery.    ____ Bring C-Pap to the hospital.

## 2016-03-04 NOTE — Pre-Procedure Instructions (Signed)
Mhoon, Arbie Cookey, MD - 02/10/2016 3:00 PM EDT Formatting of this note may be different from the original. Neurology Patient Evaluation   Referring Provider Arbie Cookey Mhoon, MD [phone], [place] [phone]   A neurology consultation was requested by Dr. Guy Franco regarding Chaya Jan for the evaluation of seizures and migraines.   Chief Complaint  Patient presents with  . Follow-up   History of Present Illness  Grace Norris is a 25 y.o. right handed female referred for evaluation of seizures and migraines. The patient reports a lifelong history of epilepsy. She states that the age of 57-month-old she turned blue to and had febrile seizures. She was started on medications at that time and remains on anti epileptics. The patient does not recall what medications she was on but has been on Keppra 250 in the morning and 500 milligrams at night for several years. She has tolerated the medication well. She has not had a seizure since the age of 6. See describes the seizures as passing out and convulsing arms and legs. Last EEG and possibly MRI 1.5 years ago.  Regarding migraine headaches the patient began experiencing frontal throbbing headaches associated with photophobia phonophobia and nausea at the age of 9. These were fairly infrequent occurring once every 6 months. Over the past several months the frequency has increased to 1 every 3 to 4 weeks. She has not been able to identify a particular trigger for this. Did move from [place] to [place] 6 months ago. She has also had significant sleep issues. She reports she goes to bed at 8:00 a.m. but does not fall asleep until 4:00 a.m. then wakes up at 5:00 a.m.Marland Kitchen She then goes back to sleep at noon and sleeps 2 5:00 p.m.Marland Kitchen She has tried Imitrex and Relpax before. She feels like these made her headache worse. Excedrin can help.  The patient describes 2 falls that occurred approximately a month ago. Patient was out dancing  and had at least 4 drinks. Mild boyfriend dropped her off at the hotel. When he came back later that evening found her asleep on the floor. No loss of bowel or bladder control. They think she may have hit her head on the headboard but are not sure or. The patient had another episode where she was very hot and dehydrated trying to get to the [facility] when her husband came home at noon for lunch. Found her on the floor. No loss of bowel or bladder control and no convulsions no loss of consciousness with this episode.  Since last visit the patient overall has been doing well. She is on Keppra 500 in the evening 250 mg in the morning. Tolerating the medication well. She does have a bit of dizziness currently but also has a cold. She reports that her mother told her that she has Graves' disease and recommended that her daughter be checked.  Past Medical and Surgical History  Past Medical History:  Diagnosis Date  . GERD (gastroesophageal reflux disease) mother  . Legally blind  Both eyes  . Seizures (CMS-HCC)  Last seizure was at age 45; Last saw Dr Ernst Bowler 07/2015  . Sleep apnea  NO CPAP  . Thyroid disease mother   Past Surgical History:  Procedure Laterality Date  . INCISION EXTENSOR TENDON SHEATH WRIST FOR RADIAL STYLOID Right 11/06/2015  Procedure: Right DeQuervain's release; Surgeon: Salley Hews, MD; Location: [facility]; Service: Orthopedics; Laterality: Right;  . INCISION EXTENSOR TENDON SHEATH WRIST FOR RADIAL STYLOID  Left 12/04/2015  Procedure: Left DeQuervain's release; Surgeon: Salley Hews, MD; Location: [facility]; Service: Orthopedics; Laterality: Left;   Family History  Family History  Problem Relation Age of Onset  . Fibromyalgia Mother  . Scoliosis Brother  . Alzheimer's disease Other   Social History  Mesha Fusselman reports that she quit smoking about 5 years ago. She has a 0.50 pack-year smoking history. She has never used smokeless tobacco. She reports that she drinks  alcohol. She reports that she does not use illicit drugs.  Medications and Allergies  Current Outpatient Prescriptions  Medication Sig Dispense Refill  . ALTAVERA, 28, 0.15-0.03 mg tablet Take 1 tablet by mouth every evening. 11  . levETIRAcetam (KEPPRA) 250 MG tablet Take 1 tab po qam and 2 tabs po qhs. 90 tablet 11  . oseltamivir (TAMIFLU) 75 MG capsule Take 1 capsule by mouth 2 (two) times daily. 0  . PROAIR HFA 90 mcg/actuation inhaler INHALE 1 PUFF ORALLY EVERY 6 HOURS AS NEEDED FOR FOR SHORTNESS OF BREATH /WHEEZING 1  . promethazine (PHENERGAN) 12.5 MG tablet Take 12.5 mg by mouth as needed for Nausea.    No current facility-administered medications for this visit.   Allergies  Allergen Reactions  . Aspartame Other (See Comments)  Seizures  . Latex Rash  . Penicillins Other (See Comments)  Seizures  . Prednisone Vomiting  . Ibuprofen Nausea And Vomiting and Rash   Review of Systems  A fourteen system review of systems was performed and found to be positive for episodes of weakness daytime sleepiness loss of balance seizures.  The patient will follow up with the PCP for evaluation of non-neurologic symptoms.  Physical Examination  Vitals:  02/10/16 1342  BP: 122/77  Pulse: 96  Temp: 36.4 C (97.6 F)  TempSrc: Oral  Weight: 56.3 kg (124 lb 1.9 oz)  Height: 162.6 cm (5\' 4" )  PainSc: 0-No pain   GENERAL: Well appearing 25 y.o. female in no acute distress. Lungs are clear to auscultation bilaterally. Heart has a regular rate and rhythm. No carotid bruits auscultated.  MENTAL STATUS: The patient is alert and oriented to person, place and time. Speech is fluent. Thought process is linear. Good concentration. Normal attention span. Good fund of knowledge demonstrated.   CRANIAL NERVES: Fundoscopic examination reveals sharp optic discs bilaterally. Pupils are equal round and reactive to light. Extra-occular movements are full. No nystagmus noted. Facial sensation and  strength are normal and symmetric bilaterally. Palate elevates symmetrically. Hearing intact to finger rub bilaterally. Tongue protrudes at midline. Strong shoulder shrug bilaterally.  MOTOR: Bulk and tone are normal throughout. No adventitious movements noted. 5/5 strength in the bilateral upper and lower extremities.  SENSATION: Pinprick, Temperature, Vibration and Proprioception normal in the upper and lower extremities.  COORDINATION: Good finger to nose, heel to shin, and rapid alternating movements. No ataxia noted.  DEEP TENDON REFLEXES: 3+ and symmetric in the bilateral upper and lower extremities with downgoing toes bilaterally.  GAIT: Normal station, gait, and arm swing. The patient does have some trouble with tandem gait. She stands with her eyes closed she drifts backwards. She develops very atypical gait that is possibly psychogenic in nature.  Labs and Imaging  No visits with results within 6 Month(s) from this visit. Latest known visit with results is:  Office Visit on 08/01/2015  Component Date Value  . WBC (White Blood Cell Co* 08/01/2015 8.8  . RBC (Red Blood Cell Coun* 08/01/2015 4.85  . Hemoglobin 08/01/2015 14.2  .  Hematocrit 08/01/2015 40.7  . MCV (Mean Corpuscular Vo* 08/01/2015 83.9  . MCH (Mean Corpuscular He* 08/01/2015 29.3  . MCHC (Mean Corpuscular H* 08/01/2015 34.9  . Platelet Count 08/01/2015 354  . RDW-CV (Red Cell Distrib* 08/01/2015 12.3  . MPV (Mean Platelet Volum* 08/01/2015 9.9  . Neutrophils 08/01/2015 5.90  . Lymphocytes 08/01/2015 2.40  . Mixed Count 08/01/2015 0.50  . Neutrophil % 08/01/2015 67.0  . Lymphocyte % 08/01/2015 27.6  . Mixed % 08/01/2015 5.4  . Uric Acid 08/01/2015 6.1  . Antinuclear Antibodies, * 08/01/2015 Negative  . Please note: - [facility] 08/01/2015 Comment  . RA Latex Turbid. - [facility]* 08/01/2015 <10.0  . C Reactive Protein - Lab* 08/01/2015 1.2  . ASO - [facility] 08/01/2015 <20.0  . Sedimentation Rate-Autom* 08/01/2015  11   Assessment and Plan  Grace Norris is a 25 y.o. female with a history of epilepsy beginning at the age of 52 months. Last seizure at the age of 55 which was 12 years ago. She has been stable on Keppra 250 milligrams the morning 500 milligrams at night without side-effect. She is off of it for 2 weeks while pregnant. Suffered a miscarriage. Patient was try to be on a lower dose. Okay to titrate down to 250 mg twice daily. Patient warned of the potential of seizure developing with the reduced dose. This occurs she will let us know right away. She should not drive or engage in dangerous activity until she is 6 months seizure-free. Will check her thyroid level as well as the blow blood test. She does not have primary care physician. There is a family history of Graves' disease.  Greater than 50% of the 40 minute appointment was face-to-face time spent counseling the patient, discussing the symptoms, workup, diagnosis, and treatment options.   Orders Placed This Encounter  Procedures  . Vitamin B1 (Thiamine), Blood  . Vitamin B12  . Thyroid Profile (TSH and T4 Free)  . Sedimentation Rate-Automated  . Comprehensive Metabolic Panel (CMP)  . Complete Blood Count (CBC)   I personally performed the service. (TP)  JUSTIN T MHOON, MD  Portions of this note were generated with voice recognition software and will most likely contain typographical errors.  This note will not be shared in MyChart  Back to top of Progress Notes Oretha Milch, [place] - 02/10/2016 3:00 PM EDT Pt instructed to talk with PCP about appropriate health maintance issues.

## 2016-03-05 ENCOUNTER — Encounter
Admission: RE | Admit: 2016-03-05 | Discharge: 2016-03-05 | Disposition: A | Discharge status: Home / Self Care | Payer: Medicare Other | Source: Ambulatory Visit | Attending: Student | Admitting: Student

## 2016-03-05 ENCOUNTER — Encounter
Admission: RE | Admit: 2016-03-05 | Discharge: 2016-03-05 | Disposition: A | Discharge status: Home / Self Care | Payer: Medicare Other | Source: Ambulatory Visit | Attending: Obstetrics and Gynecology | Admitting: Obstetrics and Gynecology

## 2016-03-05 DIAGNOSIS — Z01818 Encounter for other preprocedural examination: Secondary | ICD-10-CM | POA: Diagnosis not present

## 2016-03-05 HISTORY — DX: Anemia, unspecified: D64.9

## 2016-03-05 HISTORY — DX: Sleep apnea, unspecified: G47.30

## 2016-03-05 HISTORY — DX: Chronic kidney disease, unspecified: N18.9

## 2016-03-05 HISTORY — DX: Unspecified convulsions: R56.9

## 2016-03-05 LAB — CBC
HEMATOCRIT: 42.6 % (ref 35.0–47.0)
HEMOGLOBIN: 14.6 g/dL (ref 12.0–16.0)
MCH: 29.1 pg (ref 26.0–34.0)
MCHC: 34.3 g/dL (ref 32.0–36.0)
MCV: 84.8 fL (ref 80.0–100.0)
Platelets: 213 10*3/uL (ref 150–440)
RBC: 5.02 MIL/uL (ref 3.80–5.20)
RDW: 12.9 % (ref 11.5–14.5)
WBC: 5.8 10*3/uL (ref 3.6–11.0)

## 2016-03-09 ENCOUNTER — Encounter
Admission: RE | Disposition: A | Discharge status: Home / Self Care | Payer: Self-pay | Source: Ambulatory Visit | Attending: Obstetrics and Gynecology

## 2016-03-09 ENCOUNTER — Ambulatory Visit
Admission: RE | Admit: 2016-03-09 | Discharge: 2016-03-09 | Disposition: A | Discharge status: Home / Self Care | Payer: Medicare Other | Source: Ambulatory Visit | Attending: Obstetrics and Gynecology | Admitting: Obstetrics and Gynecology

## 2016-03-09 ENCOUNTER — Ambulatory Visit: Payer: Medicare Other | Admitting: Anesthesiology

## 2016-03-09 ENCOUNTER — Encounter: Payer: Self-pay | Admitting: *Deleted

## 2016-03-09 DIAGNOSIS — Z87891 Personal history of nicotine dependence: Secondary | ICD-10-CM | POA: Insufficient documentation

## 2016-03-09 DIAGNOSIS — N938 Other specified abnormal uterine and vaginal bleeding: Secondary | ICD-10-CM | POA: Insufficient documentation

## 2016-03-09 DIAGNOSIS — G4730 Sleep apnea, unspecified: Secondary | ICD-10-CM | POA: Diagnosis not present

## 2016-03-09 DIAGNOSIS — R102 Pelvic and perineal pain: Secondary | ICD-10-CM | POA: Diagnosis not present

## 2016-03-09 DIAGNOSIS — N803 Endometriosis of pelvic peritoneum: Secondary | ICD-10-CM | POA: Diagnosis not present

## 2016-03-09 DIAGNOSIS — R569 Unspecified convulsions: Secondary | ICD-10-CM | POA: Diagnosis not present

## 2016-03-09 DIAGNOSIS — Z419 Encounter for procedure for purposes other than remedying health state, unspecified: Secondary | ICD-10-CM

## 2016-03-09 DIAGNOSIS — J45909 Unspecified asthma, uncomplicated: Secondary | ICD-10-CM | POA: Diagnosis not present

## 2016-03-09 DIAGNOSIS — N926 Irregular menstruation, unspecified: Secondary | ICD-10-CM | POA: Diagnosis not present

## 2016-03-09 DIAGNOSIS — N946 Dysmenorrhea, unspecified: Secondary | ICD-10-CM | POA: Insufficient documentation

## 2016-03-09 HISTORY — PX: LAPAROSCOPY: SHX197

## 2016-03-09 LAB — POCT PREGNANCY, URINE: Preg Test, Ur: NEGATIVE

## 2016-03-09 SURGERY — LAPAROSCOPY, DIAGNOSTIC
Anesthesia: General

## 2016-03-09 MED ORDER — KETOROLAC TROMETHAMINE 30 MG/ML IJ SOLN
INTRAMUSCULAR | Status: DC | PRN
  Administered 2016-03-09: 30 mg via INTRAVENOUS

## 2016-03-09 MED ORDER — BUPIVACAINE HCL (PF) 0.5 % IJ SOLN
INTRAMUSCULAR | Status: AC
  Filled 2016-03-09: qty 30

## 2016-03-09 MED ORDER — HYDROMORPHONE HCL 1 MG/ML IJ SOLN
INTRAMUSCULAR | Status: DC | PRN
  Administered 2016-03-09: .6 mg via INTRAVENOUS
  Administered 2016-03-09: .4 mg via INTRAVENOUS

## 2016-03-09 MED ORDER — OXYCODONE HCL 5 MG PO TABS
ORAL_TABLET | ORAL | Status: DC
Start: 2016-03-09 — End: 2016-03-09
  Filled 2016-03-09: qty 1

## 2016-03-09 MED ORDER — FAMOTIDINE 20 MG PO TABS
20.0000 mg | ORAL_TABLET | Freq: Once | ORAL | Status: AC
  Administered 2016-03-09: 20 mg via ORAL

## 2016-03-09 MED ORDER — FENTANYL CITRATE (PF) 100 MCG/2ML IJ SOLN
25.0000 ug | INTRAMUSCULAR | Status: DC | PRN
  Administered 2016-03-09 (×2): 25 ug via INTRAVENOUS

## 2016-03-09 MED ORDER — SUGAMMADEX SODIUM 200 MG/2ML IV SOLN
INTRAVENOUS | Status: DC | PRN
  Administered 2016-03-09: 109.8 mg via INTRAVENOUS

## 2016-03-09 MED ORDER — SIMETHICONE 80 MG PO CHEW: 80.0000 mg | CHEWABLE_TABLET | Freq: Four times a day (QID) | ORAL | 0 refills | Status: DC | PRN

## 2016-03-09 MED ORDER — DEXAMETHASONE SODIUM PHOSPHATE 10 MG/ML IJ SOLN
INTRAMUSCULAR | Status: DC | PRN
  Administered 2016-03-09: 10 mg via INTRAVENOUS

## 2016-03-09 MED ORDER — ONDANSETRON HCL 4 MG/2ML IJ SOLN
INTRAMUSCULAR | Status: DC | PRN
  Administered 2016-03-09: 4 mg via INTRAVENOUS

## 2016-03-09 MED ORDER — MIDAZOLAM HCL 2 MG/2ML IJ SOLN
INTRAMUSCULAR | Status: DC | PRN
  Administered 2016-03-09: 2 mg via INTRAVENOUS

## 2016-03-09 MED ORDER — ACETAMINOPHEN ER 650 MG PO TBCR: 650.0000 mg | EXTENDED_RELEASE_TABLET | Freq: Three times a day (TID) | ORAL | 1 refills | Status: AC | PRN

## 2016-03-09 MED ORDER — BUPIVACAINE HCL 0.5 % IJ SOLN
INTRAMUSCULAR | Status: DC | PRN
  Administered 2016-03-09: 7 mL

## 2016-03-09 MED ORDER — OXYCODONE HCL 5 MG PO TABS: 5.0000 mg | ORAL_TABLET | ORAL | 0 refills | Status: DC | PRN

## 2016-03-09 MED ORDER — FENTANYL CITRATE (PF) 100 MCG/2ML IJ SOLN
INTRAMUSCULAR | Status: DC | PRN
  Administered 2016-03-09: 100 ug via INTRAVENOUS

## 2016-03-09 MED ORDER — LACTATED RINGERS IV SOLN: INTRAVENOUS | Status: DC

## 2016-03-09 MED ORDER — LIDOCAINE HCL (CARDIAC) 20 MG/ML IV SOLN
INTRAVENOUS | Status: DC | PRN
  Administered 2016-03-09: 60 mg via INTRAVENOUS

## 2016-03-09 MED ORDER — LACTATED RINGERS IV SOLN
INTRAVENOUS | Status: DC
  Administered 2016-03-09: 10:00:00 via INTRAVENOUS

## 2016-03-09 MED ORDER — FENTANYL CITRATE (PF) 100 MCG/2ML IJ SOLN
INTRAMUSCULAR | Status: AC
  Administered 2016-03-09: 25 ug via INTRAVENOUS
  Filled 2016-03-09: qty 2

## 2016-03-09 MED ORDER — OXYCODONE HCL 5 MG/5ML PO SOLN: 5.0000 mg | Freq: Once | ORAL | Status: AC | PRN

## 2016-03-09 MED ORDER — DOCUSATE SODIUM 100 MG PO CAPS: 100.0000 mg | ORAL_CAPSULE | Freq: Two times a day (BID) | ORAL | 1 refills | Status: DC | PRN

## 2016-03-09 MED ORDER — FAMOTIDINE 20 MG PO TABS
ORAL_TABLET | ORAL | Status: AC
  Filled 2016-03-09: qty 1

## 2016-03-09 MED ORDER — OXYCODONE HCL 5 MG PO TABS
5.0000 mg | ORAL_TABLET | Freq: Once | ORAL | Status: AC | PRN
  Administered 2016-03-09: 5 mg via ORAL

## 2016-03-09 MED ORDER — ROCURONIUM BROMIDE 100 MG/10ML IV SOLN
INTRAVENOUS | Status: DC | PRN
  Administered 2016-03-09: 30 mg via INTRAVENOUS

## 2016-03-09 SURGICAL SUPPLY — 44 items
BLADE SURG SZ11 CARB STEEL (BLADE) ×2 IMPLANT
CANISTER SUCT 1200ML W/VALVE (MISCELLANEOUS) ×2 IMPLANT
CATH FOLEY SIL 2WAY 14FR5CC (CATHETERS) ×2 IMPLANT
CATH ROBINSON RED A/P 16FR (CATHETERS) ×2 IMPLANT
CHLORAPREP W/TINT 26ML (MISCELLANEOUS) ×2 IMPLANT
CORD MONOPOLAR M/FML 12FT (MISCELLANEOUS) ×2 IMPLANT
GLOVE BIO SURGEON STRL SZ 6 (GLOVE) ×2 IMPLANT
GLOVE BIO SURGEON STRL SZ8 (GLOVE) ×2 IMPLANT
GLOVE BIOGEL PI IND STRL 6.5 (GLOVE) ×2 IMPLANT
GLOVE BIOGEL PI IND STRL 7.0 (GLOVE) ×3 IMPLANT
GLOVE BIOGEL PI IND STRL 7.5 (GLOVE) ×1 IMPLANT
GLOVE BIOGEL PI IND STRL 8 (GLOVE) ×1 IMPLANT
GLOVE BIOGEL PI INDICATOR 6.5 (GLOVE) ×2
GLOVE BIOGEL PI INDICATOR 7.0 (GLOVE) ×3
GLOVE BIOGEL PI INDICATOR 7.5 (GLOVE) ×1
GLOVE BIOGEL PI INDICATOR 8 (GLOVE) ×1
GLOVE INDICATOR 8.0 STRL GRN (GLOVE) ×2 IMPLANT
GOWN STRL REUS W/ TWL LRG LVL3 (GOWN DISPOSABLE) ×2 IMPLANT
GOWN STRL REUS W/TWL LRG LVL3 (GOWN DISPOSABLE) ×2
GOWN STRL REUS W/TWL XL LVL3 (GOWN DISPOSABLE) ×2 IMPLANT
IRRIGATION STRYKERFLOW (MISCELLANEOUS) ×1 IMPLANT
IRRIGATOR STRYKERFLOW (MISCELLANEOUS) ×2
IV LACTATED RINGERS 1000ML (IV SOLUTION) ×2 IMPLANT
KIT PINK PAD W/HEAD ARE REST (MISCELLANEOUS) ×2
KIT PINK PAD W/HEAD ARM REST (MISCELLANEOUS) ×1 IMPLANT
KIT RM TURNOVER CYSTO AR (KITS) ×2 IMPLANT
LABEL OR SOLS (LABEL) ×2 IMPLANT
LIQUID BAND (GAUZE/BANDAGES/DRESSINGS) ×2 IMPLANT
NS IRRIG 1000ML POUR BTL (IV SOLUTION) ×2 IMPLANT
NS IRRIG 500ML POUR BTL (IV SOLUTION) ×2 IMPLANT
PACK GYN LAPAROSCOPIC (MISCELLANEOUS) ×2 IMPLANT
PAD OB MATERNITY 4.3X12.25 (PERSONAL CARE ITEMS) ×2 IMPLANT
PAD PREP 24X41 OB/GYN DISP (PERSONAL CARE ITEMS) ×2 IMPLANT
POUCH ENDO CATCH 10MM SPEC (MISCELLANEOUS) ×2 IMPLANT
SCISSORS METZENBAUM CVD 33 (INSTRUMENTS) ×2 IMPLANT
SHEARS HARMONIC ACE PLUS 36CM (ENDOMECHANICALS) ×2 IMPLANT
SLEEVE ENDOPATH XCEL 5M (ENDOMECHANICALS) ×2 IMPLANT
SUT VIC AB 3-0 SH 27 (SUTURE) ×1
SUT VIC AB 3-0 SH 27X BRD (SUTURE) ×1 IMPLANT
SUT VICRYL 0 AB UR-6 (SUTURE) ×2 IMPLANT
TROCAR ENDO BLADELESS 11MM (ENDOMECHANICALS) ×2 IMPLANT
TROCAR XCEL NON-BLD 5MMX100MML (ENDOMECHANICALS) ×2 IMPLANT
TROCAR XCEL UNIV SLVE 11M 100M (ENDOMECHANICALS) ×2 IMPLANT
TUBING INSUFFLATOR HI FLOW (MISCELLANEOUS) ×2 IMPLANT

## 2016-03-09 NOTE — Discharge Instructions (Signed)
General Gynecological Post-Operative Instructions You may expect to feel dizzy, weak, and drowsy for as long as 24 hours after receiving the medicine that made you sleep (anesthetic).  Do not drive a car, ride a bicycle, participate in physical activities, or take public transportation until you are done taking narcotic pain medicines or as directed by your doctor.  Do not drink alcohol or take tranquilizers.  Do not take medicine that has not been prescribed by your doctor.  Do not sign important papers or make important decisions while on narcotic pain medicines.  Have a responsible person with you.  CARE OF INCISION  Keep incision clean and dry. Take showers instead of baths until your doctor gives you permission to take baths.  Avoid heavy lifting (more than 10 pounds/4.5 kilograms), pushing, or pulling.  Avoid activities that may risk injury to your surgical site.  No sexual intercourse or placement of anything in the vagina for 2 weeks or as instructed by your doctor. If you have tubes coming from the wound site, check with your doctor regarding appropriate care of the tubes. Only take prescription or over-the-counter medicines  for pain, discomfort, or fever as directed by your doctor. Do not take aspirin. It can make you bleed. Take medicines (antibiotics) that kill germs if they are prescribed for you.  Call the office or go to the MAU if:  You feel sick to your stomach (nauseous).  You start to throw up (vomit).  You have trouble eating or drinking.  You have an oral temperature above 101.  You have constipation that is not helped by adjusting diet or increasing fluid intake. Pain medicines are a common cause of constipation.  You have any other concerns. SEEK IMMEDIATE MEDICAL CARE IF:  You have persistent dizziness.  You have difficulty breathing or a congested sounding (croupy) cough.  You have an oral temperature above 102.5, not controlled by medicine.  There is increasing  pain or tenderness near or in the surgical site.   AMBULATORY SURGERY  DISCHARGE INSTRUCTIONS   1) The drugs that you were given will stay in your system until tomorrow so for the next 24 hours you should not:  A) Drive an automobile B) Make any legal decisions C) Drink any alcoholic beverage   2) You may resume regular meals tomorrow.  Today it is better to start with liquids and gradually work up to solid foods.  You may eat anything you prefer, but it is better to start with liquids, then soup and crackers, and gradually work up to solid foods.   3) Please notify your doctor immediately if you have any unusual bleeding, trouble breathing, redness and pain at the surgery site, drainage, fever, or pain not relieved by medication.    4) Additional Instructions:        Please contact your physician with any problems or Same Day Surgery at [phone], Monday through Friday 6 am to 4 pm, or [place] at [facility] number at [phone].

## 2016-03-09 NOTE — Anesthesia Procedure Notes (Signed)
Procedure Name: Intubation Date/Time: 03/09/2016 11:58 AM Performed by: Justus Memory Pre-anesthesia Checklist: Patient identified, Emergency Drugs available, Suction available and Patient being monitored Patient Re-evaluated:Patient Re-evaluated prior to inductionOxygen Delivery Method: Circle system utilized Preoxygenation: Pre-oxygenation with 100% oxygen Intubation Type: IV induction Ventilation: Mask ventilation without difficulty Grade View: Grade I Tube type: Oral Tube size: 7.0 mm Number of attempts: 1 Airway Equipment and Method: Patient positioned with wedge pillow and Stylet Placement Confirmation: ETT inserted through vocal cords under direct vision,  positive ETCO2 and CO2 detector Secured at: 21 (teeth) cm Tube secured with: Tape Dental Injury: Teeth and Oropharynx as per pre-operative assessment

## 2016-03-09 NOTE — Transfer of Care (Signed)
Immediate Anesthesia Transfer of Care Note  Patient: Grace Norris  Procedure(s) Performed: Procedure(s): LAPAROSCOPY DIAGNOSTIC and D AND C (N/A)  Patient Location: PACU  Anesthesia Type:General  Level of Consciousness: sedated  Airway & Oxygen Therapy: Patient Spontanous Breathing and Patient connected to face mask oxygen  Post-op Assessment: Report given to RN and Post -op Vital signs reviewed and stable  Post vital signs: Reviewed and stable  Last Vitals:  Vitals:   03/09/16 0930  BP: 128/74  Pulse: (!) 105  Resp: 16  Temp: 36.1 C    Last Pain:  Vitals:   03/09/16 0930  TempSrc: Oral         Complications: No apparent anesthesia complications

## 2016-03-09 NOTE — Op Note (Signed)
Procedure(s): LAPAROSCOPY DIAGNOSTIC and D AND C Procedure Note  Grace Norris female 26 y.o. 03/09/2016  Indications: The patient is a 25 y.o. G29P0010 female with dysfunctional uterine bleeding, pelvic pain, dysmenorrhea.  Pre-operative Diagnosis: dysfunctional uterine bleeding, pelvic pain, dysmenorrhea.   Post-operative Diagnosis: dysfunctional uterine bleeding, pelvic pain, dysmenorrhea, and lesions suspicious for endometriosis.   Surgeon: Rubie Maid, MD  Assistants: Arlyss Gandy  Anesthesia: General endotracheal anesthesia  Findings: The uterus was sounded to 8 cm.  Normal appearing uterus, no masses.  Fallopian tubes and ovaries appeared normal.  Powder burn lesions noted on left and right uterosacral ligaments. No cul-de-sac lesions. Normal appearing upper abdomen.   Procedure Details: The patient was seen in the Holding Room. The risks, benefits, complications, treatment options, and expected outcomes were discussed with the patient.  The patient concurred with the proposed plan, giving informed consent.  The site of surgery properly noted/marked. The patient was taken to the Operating Room, identified as [name] and the procedure verified as Procedure(s) (LRB): LAPAROSCOPY DIAGNOSTIC and D AND C (N/A).   She was placed under general anesthesia without difficulty. She was then placed in the dorsal lithotomy position, and was prepped and draped in a sterile manner. A Time Out was held and the above information confirmed. A straight catheterization was performed. A sterile speculum was inserted into the vagina and the cervix was grasped at the anterior lip using a single-toothed tenaculum.  The uterus was sounded to 8 cm and dilated appropriately.  A smooth endometrial curette was passed and a curettage was performed. No tissue recovered from curettage.  Next, a Hulka clamp was placed for uterine manipulation.  The speculum and tenaculum were then  removed.  Attention was turned to the abdomen where an umbilical incision was made with the scalpel.  The Optiview 5-mm trocar and sleeve were then advanced without difficulty with the laparoscope under direct visualization into the abdomen.  The abdomen was then insufflated with carbon dioxide gas and adequate pneumoperitoneum was obtained. A 5-mm right lower quadrant port was then placed under direct visualization.  A survey of the patient's pelvis and abdomen revealed the findings as above.  Biopsies of the bilateral utero-sacral ligaments were performed. The biopsy sites were then cauterized using the bipolar Kleppingers for hemostasis.      A final survey was performed, where good hemostasis was noted.  All trocars were removed under direct visualization, and the abdomen which was desufflated.    All skin incisions were closed with Liquiband. The patient tolerated the procedures well.  All instruments, needles, and sponge counts were correct x 2. The patient was taken to the recovery room awake, extubated and in stable condition.    Estimated Blood Loss:  10  ml      Drains: straight catheterization prior to procedure with 150 ml of clear urine         Total IV Fluids:  1000 ml  Specimens: Bilateral uterosacral ligament biopsies         Implants: None         Complications:  None; patient tolerated the procedure well.         Disposition: PACU - hemodynamically stable.         Condition: stable   Rubie Maid, MD [facility]

## 2016-03-09 NOTE — Anesthesia Postprocedure Evaluation (Signed)
Anesthesia Post Note  Patient: Hiram Gash  Procedure(s) Performed: Procedure(s) (LRB): LAPAROSCOPY DIAGNOSTIC and D AND C (N/A)  Patient location during evaluation: PACU Anesthesia Type: General Level of consciousness: awake and alert and oriented Pain management: pain level controlled Vital Signs Assessment: post-procedure vital signs reviewed and stable Respiratory status: spontaneous breathing, nonlabored ventilation and respiratory function stable Cardiovascular status: blood pressure returned to baseline and stable Postop Assessment: no signs of nausea or vomiting Anesthetic complications: no    Last Vitals:  Vitals:   03/09/16 1315 03/09/16 1322  BP:  132/71  Pulse: (!) 101 100  Resp: 11 12  Temp:      Last Pain:  Vitals:   03/09/16 1322  TempSrc:   PainSc: 6                  Leanore Biggers

## 2016-03-09 NOTE — Anesthesia Preprocedure Evaluation (Signed)
Anesthesia Evaluation  Patient identified by MRN, date of birth, ID band Patient awake    Reviewed: Allergy & Precautions, H&P , NPO status , Patient's Chart, lab work & pertinent test results  History of Anesthesia Complications Negative for: history of anesthetic complications  Airway Mallampati: II  TM Distance: <3 FB Neck ROM: full    Dental no notable dental hx. (+) Poor Dentition   Pulmonary neg shortness of breath, asthma , sleep apnea , former smoker,    Pulmonary exam normal breath sounds clear to auscultation       Cardiovascular Exercise Tolerance: Good (-) angina(-) Past MI negative cardio ROS Normal cardiovascular exam Rhythm:regular Rate:Normal     Neuro/Psych  Headaches, Seizures -, Well Controlled,  negative psych ROS   GI/Hepatic negative GI ROS, Neg liver ROS,   Endo/Other  negative endocrine ROS  Renal/GU Renal disease     Musculoskeletal   Abdominal   Peds  Hematology negative hematology ROS (+)   Anesthesia Other Findings Past Medical History: No date: Anemia No date: Asthma     Comment: WELL CONTROLLED No date: Chronic kidney disease     Comment: H/O STONES No date: Epilepsy ([place]) No date: Migraines 2016: Seizures ([place])     Comment: LAST SEIZURE 1-1.5 YRS AGO No date: Sleep apnea     Comment: DOES NOT USE CPAP  Past Surgical History: 11/2015: HAND TENDON SURGERY Bilateral  BMI    Body Mass Index:  20.77 kg/m      Reproductive/Obstetrics negative OB ROS                             Anesthesia Physical Anesthesia Plan  ASA: III  Anesthesia Plan: General ETT   Post-op Pain Management:    Induction:   Airway Management Planned:   Additional Equipment:   Intra-op Plan:   Post-operative Plan:   Informed Consent: I have reviewed the patients History and Physical, chart, labs and discussed the procedure including the risks, benefits and  alternatives for the proposed anesthesia with the patient or authorized representative who has indicated his/her understanding and acceptance.     Plan Discussed with: Anesthesiologist, CRNA and Surgeon  Anesthesia Plan Comments:         Anesthesia Quick Evaluation

## 2016-03-09 NOTE — H&P (Signed)
UPDATE TO PREVIOUS HISTORY AND PHYSICAL  The patient has been seen and examined.  H&P is up to date, no changes noted. Grace Norris is scheduled for operative laparoscopy with biopsies. Indications for procedure are pelvic pain, breakthrough bleeding on OCP, h/o irregular menses, and dysmenorrhea. All questions answered. Patient can proceed to the OR for scheduled procedure.   Rubie Maid, MD 03/09/2016 10:46 AM

## 2016-03-10 LAB — SURGICAL PATHOLOGY

## 2016-03-13 ENCOUNTER — Encounter: Payer: Self-pay | Admitting: Obstetrics and Gynecology

## 2016-03-16 ENCOUNTER — Encounter: Payer: Self-pay | Admitting: Obstetrics and Gynecology

## 2016-03-19 ENCOUNTER — Ambulatory Visit (INDEPENDENT_AMBULATORY_CARE_PROVIDER_SITE_OTHER): Payer: Medicare Other | Admitting: Obstetrics and Gynecology

## 2016-03-19 ENCOUNTER — Encounter: Payer: Self-pay | Admitting: Obstetrics and Gynecology

## 2016-03-19 VITALS — BP 122/86 | HR 90 | Ht 64.0 in | Wt 128.3 lb

## 2016-03-19 DIAGNOSIS — Z9889 Other specified postprocedural states: Secondary | ICD-10-CM

## 2016-03-19 DIAGNOSIS — Z9189 Other specified personal risk factors, not elsewhere classified: Secondary | ICD-10-CM

## 2016-03-19 DIAGNOSIS — T402X5A Adverse effect of other opioids, initial encounter: Secondary | ICD-10-CM

## 2016-03-19 DIAGNOSIS — K5903 Drug induced constipation: Secondary | ICD-10-CM

## 2016-03-19 DIAGNOSIS — N809 Endometriosis, unspecified: Secondary | ICD-10-CM

## 2016-03-19 HISTORY — DX: Endometriosis, unspecified: N80.9

## 2016-03-19 NOTE — Patient Instructions (Signed)
Endometriosis Endometriosis is a condition in which the tissue that lines the uterus (endometrium) grows outside of its normal location. The tissue may grow in many locations close to the uterus, but it commonly grows on the ovaries, fallopian tubes, vagina, or bowel. Because the uterus expels, or sheds, its lining every menstrual cycle, there is bleeding wherever the endometrial tissue is located. This can cause pain because blood is irritating to tissues not normally exposed to it.  CAUSES  The cause of endometriosis is not known.  SIGNS AND SYMPTOMS  Often, there are no symptoms. When symptoms are present, they can vary with the location of the displaced tissue. Various symptoms can occur at different times. Although symptoms occur mainly during a woman's menstrual period, they can also occur midcycle and usually stop with menopause. Some people may go months with no symptoms at all. Symptoms may include:   Back or abdominal pain.   Heavier bleeding during periods.   Pain during intercourse.   Painful bowel movements.   Infertility. DIAGNOSIS  Your health care provider will do a physical exam and ask about your symptoms. Various tests may be done, such as:   Blood tests and urine tests. These are done to help rule out other problems.   Ultrasound. This test is done to look for abnormal tissue.   An X-ray of the lower bowel (barium enema).  Laparoscopy. In this procedure, a thin, lighted tube with a tiny camera on the end (laparoscope) is inserted into your abdomen. This helps your health care provider look for abnormal tissue to confirm the diagnosis. The health care provider may also remove a small piece of tissue (biopsy) from any abnormal tissue found. This tissue sample can then be sent to a lab so it can be looked at under a microscope. TREATMENT  Treatment will vary and may include:   Medicines to relieve pain. Nonsteroidal anti-inflammatory drugs (NSAIDs) are a type of  pain medicine that can help to relieve the pain caused by endometriosis.  Hormonal therapy. When using hormonal therapy, periods are eliminated. This eliminates the monthly exposure to blood by the displaced endometrial tissue.   Surgery. Surgery may sometimes be done to remove the abnormal endometrial tissue. In severe cases, surgery may be done to remove the fallopian tubes, uterus, and ovaries (hysterectomy). HOME CARE INSTRUCTIONS   Take all medicines as directed by your health care provider. Do not take aspirin because it may increase bleeding when you are not on hormonal therapy.   Avoid activities that produce pain, including sexual activity. SEEK MEDICAL CARE IF:  You have pelvic pain before, after, or during your periods.  You have pelvic pain between periods that gets worse during your period.  You have pelvic pain during or after sex.  You have pelvic pain with bowel movements or urination, especially during your period.  You have problems getting pregnant.  You have a fever. SEEK IMMEDIATE MEDICAL CARE IF:   Your pain is severe and is not responding to pain medicine.   You have severe nausea and vomiting, or you cannot keep foods down.   You have pain that is limited to the right lower part of your abdomen.   You have swelling or increasing pain in your abdomen.   You see blood in your stool.  MAKE SURE YOU:   Understand these instructions.  Will watch your condition.  Will get help right away if you are not doing well or get worse.   This information   is not intended to replace advice given to you by your health care provider. Make sure you discuss any questions you have with your health care provider.   Document Released: 05/15/2000 Document Revised: 06/08/2014 Document Reviewed: 01/13/2013 Elsevier Interactive Patient Education 2016 [employer]  

## 2016-03-19 NOTE — Progress Notes (Signed)
    OBSTETRICS/GYNECOLOGY POST-OPERATIVE CLINIC VISIT  Subjective:    Grace Norris is a 25 y.o. G25P0010 female who presents to the clinic 1 weeks status post operative laparoscopy with biopsies for suspected endometriosis. Eating a regular diet without difficulty. Bowel movements are abnormal with mild constipation. Pain is controlled with current analgesics. Medications being used: acetaminophen and narcotic analgesics including Oxicodone.  The following portions of the patient's history were reviewed and updated as appropriate: allergies, current medications, past family history, past medical history, past social history, past surgical history and problem list.  Review of Systems Pertinent items noted in HPI and remainder of comprehensive ROS otherwise negative.    Objective:    BP 124/78 (BP Location: Left Arm, Patient Position: Sitting, Cuff Size: Normal)   Pulse 97   Ht 5\' 4"  (1.626 m)   Wt 204 lb 3.2 oz (92.6 kg)   Breastfeeding? No   BMI 35.05 kg/m  General:  alert and no distress  Abdomen: soft, bowel sounds active, non-tender  Incision:   healing well, no drainage, no erythema, no hernia, no seroma, no swelling, no dehiscence, incision well approximated       Pathology:   DIAGNOSIS:  A. PERITONEUM, RIGHT UTEROSACRAL; BIOPSY:  - UNREMARKABLE MESOTHELIAL TISSUE.   B. PERITONEUM, LEFT UTEROSACRAL; BIOPSY:  - ENDOMETRIOSIS.   Assessment:    Doing well postoperatively.  Mild constipation Newly diagnosed endometriosis  Desires fertility  Plan:   1. Continue any current medications. Counseled on filling prescription for Colace as previously prescribed for constipation.  2. Wound care discussed.   3. Operative findings again reviewed. Pathology report discussed.  Discussed endometriosis and possible role in infertility. Patient currently on OCPs, desires to discontinue after this month and attempt conception. Given education on fertile period each month and timed  coitus.   4. Activity restrictions: no bending, stooping, or squatting, no lifting more than 15 pounds and pelvic rest for an additional week 5. Anticipated return to work: 1 week. 6. Follow up: if symptoms return or worsen.    Rubie Maid, MD [facility]

## 2016-04-28 ENCOUNTER — Encounter: Payer: Self-pay | Admitting: Obstetrics and Gynecology

## 2016-05-14 DIAGNOSIS — G5602 Carpal tunnel syndrome, left upper limb: Secondary | ICD-10-CM | POA: Insufficient documentation

## 2016-05-18 ENCOUNTER — Encounter: Payer: Self-pay | Admitting: Emergency Medicine

## 2016-05-18 ENCOUNTER — Emergency Department: Payer: Medicare Other

## 2016-05-18 ENCOUNTER — Emergency Department
Admission: EM | Admit: 2016-05-18 | Discharge: 2016-05-18 | Disposition: A | Discharge status: Home / Self Care | Payer: Medicare Other | Attending: Emergency Medicine | Admitting: Emergency Medicine

## 2016-05-18 DIAGNOSIS — J45909 Unspecified asthma, uncomplicated: Secondary | ICD-10-CM | POA: Diagnosis not present

## 2016-05-18 DIAGNOSIS — R1032 Left lower quadrant pain: Secondary | ICD-10-CM

## 2016-05-18 DIAGNOSIS — N189 Chronic kidney disease, unspecified: Secondary | ICD-10-CM | POA: Insufficient documentation

## 2016-05-18 DIAGNOSIS — Z87891 Personal history of nicotine dependence: Secondary | ICD-10-CM | POA: Insufficient documentation

## 2016-05-18 DIAGNOSIS — R109 Unspecified abdominal pain: Secondary | ICD-10-CM

## 2016-05-18 LAB — CBC
HEMATOCRIT: 41.5 % (ref 35.0–47.0)
HEMOGLOBIN: 14.1 g/dL (ref 12.0–16.0)
MCH: 29.1 pg (ref 26.0–34.0)
MCHC: 34 g/dL (ref 32.0–36.0)
MCV: 85.6 fL (ref 80.0–100.0)
Platelets: 229 10*3/uL (ref 150–440)
RBC: 4.84 MIL/uL (ref 3.80–5.20)
RDW: 13.1 % (ref 11.5–14.5)
WBC: 4.8 10*3/uL (ref 3.6–11.0)

## 2016-05-18 LAB — COMPREHENSIVE METABOLIC PANEL
ALK PHOS: 30 U/L — AB (ref 38–126)
ALT: 18 U/L (ref 14–54)
ANION GAP: 6 (ref 5–15)
AST: 19 U/L (ref 15–41)
Albumin: 4.2 g/dL (ref 3.5–5.0)
BUN: 14 mg/dL (ref 6–20)
CALCIUM: 9.1 mg/dL (ref 8.9–10.3)
CHLORIDE: 107 mmol/L (ref 101–111)
CO2: 26 mmol/L (ref 22–32)
Creatinine, Ser: 0.56 mg/dL (ref 0.44–1.00)
GFR calc non Af Amer: >60 mL/min (ref >60)
Glucose, Bld: 93 mg/dL (ref 65–99)
POTASSIUM: 4.2 mmol/L (ref 3.5–5.1)
Sodium: 139 mmol/L (ref 135–145)
Total Bilirubin: 0.5 mg/dL (ref 0.3–1.2)
Total Protein: 7.1 g/dL (ref 6.5–8.1)

## 2016-05-18 LAB — URINALYSIS, COMPLETE (UACMP) WITH MICROSCOPIC
BILIRUBIN URINE: NEGATIVE
Bacteria, UA: NONE SEEN
Glucose, UA: NEGATIVE mg/dL
HGB URINE DIPSTICK: NEGATIVE
Ketones, ur: NEGATIVE mg/dL
NITRITE: NEGATIVE
PROTEIN: NEGATIVE mg/dL
RBC / HPF: NONE SEEN RBC/hpf (ref 0–5)
Specific Gravity, Urine: 1.016 (ref 1.005–1.030)
pH: 6 (ref 5.0–8.0)

## 2016-05-18 LAB — POCT PREGNANCY, URINE: PREG TEST UR: NEGATIVE

## 2016-05-18 LAB — LIPASE, BLOOD: LIPASE: 23 U/L (ref 11–51)

## 2016-05-18 MED ORDER — TRAMADOL HCL 50 MG PO TABS: 50.0000 mg | ORAL_TABLET | Freq: Four times a day (QID) | ORAL | 0 refills | Status: DC | PRN

## 2016-05-18 MED ORDER — ACETAMINOPHEN 325 MG PO TABS
ORAL_TABLET | ORAL | Status: AC
  Administered 2016-05-18: 650 mg via ORAL
  Filled 2016-05-18: qty 2

## 2016-05-18 MED ORDER — ACETAMINOPHEN 325 MG PO TABS
650.0000 mg | ORAL_TABLET | Freq: Once | ORAL | Status: AC
  Administered 2016-05-18: 650 mg via ORAL

## 2016-05-18 NOTE — ED Notes (Signed)
Ambulated to BR.  Gait steady.  Posture upright and relaxed.  NAD

## 2016-05-18 NOTE — ED Triage Notes (Signed)
Lower abd pain x 4 to 5 days. History of endometrial surgery 4 months ago.

## 2016-05-18 NOTE — ED Provider Notes (Signed)
[facility] Emergency Department Provider Note   ____________________________________________    I have reviewed the triage vital signs and the nursing notes.   HISTORY  Chief Complaint Abdominal Pain     HPI Grace Norris is a 25 y.o. female who presents with complaints of left lower quadrant pain. Patient states this is related to her endometriosis which she was diagnosed by Dr. Marcelline Mates. She reports the pain came on somewhat gradually in the left side. However typically this pain develops  close to when her period starts. She denies vaginal discharge. No vaginal bleeding. No dysuria. No nausea or vomiting.   Past Medical History:  Diagnosis Date  . Anemia   . Asthma    WELL CONTROLLED  . Chronic kidney disease    H/O STONES  . Endometriosis 03/19/2016  . Epilepsy ([place])   . Migraines   . Seizures ([place]) 2016   LAST SEIZURE 1-1.5 YRS AGO  . Sleep apnea    DOES NOT USE CPAP    Patient Active Problem List   Diagnosis Date Noted  . Endometriosis 03/19/2016  . Family history of cancer 10/12/2015  . Pelvic pain in female 10/12/2015    Past Surgical History:  Procedure Laterality Date  . HAND TENDON SURGERY Bilateral 11/2015  . LAPAROSCOPY N/A 03/09/2016   Procedure: LAPAROSCOPY DIAGNOSTIC and D AND C;  Surgeon: Rubie Maid, MD;  Location: [facility] ORS;  Service: Gynecology;  Laterality: N/A;    Prior to Admission medications   Medication Sig Start Date End Date Taking? Authorizing Provider  acetaminophen (TYLENOL 8 HOUR) 650 MG CR tablet Take 1 tablet (650 mg total) by mouth every 8 (eight) hours as needed for pain (mild). 03/09/16   Rubie Maid, MD  albuterol-ipratropium (COMBIVENT) 18-103 MCG/ACT inhaler Inhale 2 puffs into the lungs every 4 (four) hours as needed for wheezing or shortness of breath.    Historical Provider, MD  docusate sodium (COLACE) 100 MG capsule Take 1 capsule (100 mg total) by mouth 2 (two) times daily as needed for  mild constipation. 03/09/16   Rubie Maid, MD  levETIRAcetam (KEPPRA) 250 MG tablet Take 250 mg by mouth 2 (two) times daily.     Historical Provider, MD  lidocaine (XYLOCAINE) 2 % solution TAKE 5 MLS BY MOUTH EVERY 6 HOURS AS NEEDED FOR MOUTH PAIN.MIX 5 MLS WITH BROMFED 02/11/16   Historical Provider, MD  Multiple Vitamin (MULTIVITAMIN) tablet Take 1 tablet by mouth daily.    Historical Provider, MD  oxyCODONE (ROXICODONE) 5 MG immediate release tablet Take 1 tablet (5 mg total) by mouth every 4 (four) hours as needed for severe pain. 03/09/16   Rubie Maid, MD  simethicone (GAS-X) 80 MG chewable tablet Chew 1 tablet (80 mg total) by mouth every 6 (six) hours as needed for flatulence. 03/09/16   Rubie Maid, MD  traMADol (ULTRAM) 50 MG tablet Take 1 tablet (50 mg total) by mouth every 6 (six) hours as needed. 05/18/16 05/18/17  Lavonia Drafts, MD     Allergies Aspartame; Prednisone; Ibuprofen; Latex; and Penicillins  Family History  Problem Relation Age of Onset  . Scoliosis Brother   . Fibromyalgia Mother   . Cancer Mother 91    ovarian; s/p partial hysterectomy with RSO  . Graves' disease Mother   . Alzheimer's disease Other   . Cancer Maternal Grandmother 30    breast cancer (unilateral lumpectomy)  . Breast cancer Maternal Grandmother     Social History Social History  Substance Use Topics  . Smoking status: Former Smoker    Packs/day: 0.50    Years: 1.50    Types: Cigarettes    Quit date: 03/05/2011  . Smokeless tobacco: Never Used  . Alcohol use No    Review of Systems  Constitutional: No fever/chills Eyes: No visual changes.  ENT: No sore throat.  Gastrointestinal: As above Genitourinary: Negative for dysuria. No vaginal discharge Musculoskeletal: Negative for back pain. Skin: Negative for rash. Neurological: Negative for headaches  10-point ROS otherwise negative.  ____________________________________________   PHYSICAL EXAM:  VITAL SIGNS: ED Triage  Vitals  Enc Vitals Group     BP 05/18/16 0853 (!) 112/95     Pulse Rate 05/18/16 0853 85     Resp 05/18/16 0853 20     Temp 05/18/16 0853 98.2 F (36.8 C)     Temp Source 05/18/16 0853 Oral     SpO2 05/18/16 0853 100 %     Weight 05/18/16 0855 124 lb (56.2 kg)     Height 05/18/16 0855 5\' 4"  (1.626 m)     Head Circumference --      Peak Flow --      Pain Score 05/18/16 0855 10     Pain Loc --      Pain Edu? --      Excl. in [place]? --     Constitutional: Alert and oriented. No acute distress.  Eyes: Conjunctivae are normal.   Nose: No congestion/rhinnorhea. Mouth/Throat: Mucous membranes are moist.    Cardiovascular: Normal rate, regular rhythm. Grossly normal heart sounds.  Good peripheral circulation. Respiratory: Normal respiratory effort.  No retractions. Lungs CTAB. Gastrointestinal: Soft and nontender. No distention.  No CVA tenderness. Genitourinary: deferred per patient request Musculoskeletal: No lower extremity tenderness nor edema.  Warm and well perfused Neurologic:  Normal speech and language. No gross focal neurologic deficits are appreciated.  Skin:  Skin is warm, dry and intact. No rash noted. Psychiatric: Mood and affect are normal. Speech and behavior are normal.  ____________________________________________   LABS (all labs ordered are listed, but only abnormal results are displayed)  Labs Reviewed  COMPREHENSIVE METABOLIC PANEL - Abnormal; Notable for the following:       Result Value   Alkaline Phosphatase 30 (*)    All other components within normal limits  URINALYSIS, COMPLETE (UACMP) WITH MICROSCOPIC - Abnormal; Notable for the following:    Color, Urine YELLOW (*)    APPearance CLEAR (*)    Leukocytes, UA SMALL (*)    Squamous Epithelial / LPF 0-5 (*)    All other components within normal limits  LIPASE, BLOOD  CBC  POC URINE PREG, ED  POCT PREGNANCY, URINE    ____________________________________________  EKG  None________________________________________  RADIOLOGY  CT scan unremarkable ____________________________________________   PROCEDURES  Procedure(s) performed: No    Critical Care performed:No ____________________________________________   INITIAL IMPRESSION / ASSESSMENT AND PLAN / ED COURSE  Pertinent labs & imaging results that were available during my care of the patient were reviewed by me and considered in my medical decision making (see chart for details).  Patient well-appearing in no acute distress. She presents with left lower quadrant/pelvic pain. CT scan is unremarkable, no evidence of kidney stone. Endometriosis is certainly a possibility as is ovarian cyst. Does not appear consistent with ovarian torsion given the gradual onset of pain  Clinical Course   Discussed case with Dr. Marcelline Mates, recommends pain control and outpatient follow-up. Discussed this with patient and she agrees  with this plan. Return precautions discussed at length. ____________________________________________   FINAL CLINICAL IMPRESSION(S) / ED DIAGNOSES  Final diagnoses:  Flank pain, acute  Left lower quadrant pain      NEW MEDICATIONS STARTED DURING THIS VISIT:  Discharge Medication List as of 05/18/2016 12:46 PM    START taking these medications   Details  traMADol (ULTRAM) 50 MG tablet Take 1 tablet (50 mg total) by mouth every 6 (six) hours as needed., Starting Mon 05/18/2016, Until Tue 05/18/2017, Print         Note:  This document was prepared using Dragon voice recognition software and may include unintentional dictation errors.    Lavonia Drafts, MD 05/18/16 1320

## 2016-05-18 NOTE — ED Notes (Addendum)
Pt a/o. NAD. Pain 10/10 left lower abd. Per pt similar to other episodes of endometriosis.

## 2016-05-21 ENCOUNTER — Encounter: Payer: Self-pay | Admitting: Obstetrics and Gynecology

## 2016-05-21 ENCOUNTER — Ambulatory Visit (INDEPENDENT_AMBULATORY_CARE_PROVIDER_SITE_OTHER): Payer: Medicare Other | Admitting: Obstetrics and Gynecology

## 2016-05-21 VITALS — BP 128/83 | HR 90 | Ht 64.0 in | Wt 127.5 lb

## 2016-05-21 DIAGNOSIS — N809 Endometriosis, unspecified: Secondary | ICD-10-CM

## 2016-05-21 DIAGNOSIS — R102 Pelvic and perineal pain: Secondary | ICD-10-CM

## 2016-05-21 DIAGNOSIS — N921 Excessive and frequent menstruation with irregular cycle: Secondary | ICD-10-CM

## 2016-05-21 MED ORDER — OXYCODONE-ACETAMINOPHEN 5-325 MG PO TABS: 1.0000 | ORAL_TABLET | Freq: Four times a day (QID) | ORAL | 0 refills | Status: DC | PRN

## 2016-05-21 NOTE — Progress Notes (Signed)
GYNECOLOGY PROGRESS NOTE  Subjective:    Patient ID: Grace Norris, female    DOB: [birth year], 25 y.o.   MRN: [id number]  HPI  Patient is a 25 y.o. G27P0010 female with h/o endometriosis who presents for complaints  of pain on left side x 1 week. Denies urinary complaints. Notes stools are a little more runny.  Was seen in the ER several days ago for pain. Was ruled out for kidney stones. Found no cause of patient's pain, ruled in endometriosis pain.  Reports that they sent her home with a 2 days of Percocet and some Ibuprofen.  Is still doing the Ibuprofen as she has run ou of Percocet.  Notes that her pain is less well controlled.  The following portions of the patient's history were reviewed and updated as appropriate: allergies, current medications, past family history, past medical history, past social history, past surgical history and problem list.  Review of Systems A comprehensive review of systems was negative except for: Genitourinary: positive for abnormal menstrual periods (still noting breakthrough bleeding on OCPs with dysmenorrhea).   Objective:   Blood pressure 128/83, pulse 90, height 5\' 4"  (1.626 m), weight 127 lb 8 oz (57.8 kg), last menstrual period 03/21/2016. General appearance: alert and no distress Abdomen: normal findings: bowel sounds normal, no masses palpable and soft and abnormal findings:  mild tenderness in the LLQ Pelvic: deferred, at patient's request Extremities: extremities normal, atraumatic, no cyanosis or edema Neurologic: Grossly normal   Imaging (05/18/2016):  CLINICAL DATA:  Five day history abdominal pain  EXAM: CT ABDOMEN AND PELVIS WITHOUT CONTRAST  TECHNIQUE: Multidetector CT imaging of the abdomen and pelvis was performed following the standard protocol without oral or intravenous contrast material administration.  COMPARISON:  August 24, 2015  FINDINGS: Lower chest: Lung bases are clear.  Hepatobiliary: No focal liver  lesions are evident on this noncontrast enhanced study. Gallbladder wall is not appreciably thickened. There is no biliary duct dilatation.   Pancreas: There is no pancreatic mass or inflammatory focus.  Spleen: No splenic lesions are evident.  Adrenals/Urinary Tract: Adrenals appear normal bilaterally. 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 There is no renal mass or hydronephrosis on either side. No renal or ureteral calculi are evident on either side. Urinary bladder is midline with wall thickness within normal limits.  Stomach/Bowel: There is no appreciable bowel wall or mesenteric thickening. There is no bowel obstruction. No free air or portal venous air.  Vascular/Lymphatic: There is no abdominal aortic aneurysm. No vascular lesions are evident on this noncontrast enhanced study. No adenopathy is apparent in the abdomen or pelvis.  Reproductive: The uterus is retroverted. There is no pelvic mass or pelvic fluid collection.  Other: Appendix appears unremarkable. There is no ascites or abscess in the abdomen or pelvis.  Musculoskeletal: There are no blastic or lytic bone lesions. There is mild levoscoliosis. No intramuscular or abdominal wall lesions.  IMPRESSION: No bowel obstruction or bowel wall thickening. No abscess. Appendix region appears normal. No renal or ureteral calculus. No hydronephrosis. 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  Assessment:   H/o endometriosis Left sided pelvic pain Breakthrough bleeding on OCPs  Plan:   - Patient with complaints of left sided pelvic pain and h/o endometriosis.  Recent imaging with no findings of pelvic masses.  Small refill given on Percocets.  - Discussion had on further management of endometriosis and breakthrough bleeding on OCPs. Patient  has been tried on several different OCPs in the past, and has tried continuous vs cyclic regimens.  Reviewed other treatment  options again with patient and current desires for pregnancy.  Patient notes that she is no longer as concerned with getting pregnant right away, and would like to try other options for management.  After discussion, patient willing to try an IUD.  Will return in 1-2 weeks for [place] IUD.    Rubie Maid, MD [facility]

## 2016-05-29 ENCOUNTER — Encounter: Payer: Self-pay | Admitting: Obstetrics and Gynecology

## 2016-06-09 ENCOUNTER — Encounter: Payer: Medicare Other | Admitting: Obstetrics and Gynecology

## 2016-06-11 ENCOUNTER — Encounter: Payer: Self-pay | Admitting: Obstetrics and Gynecology

## 2016-06-11 ENCOUNTER — Ambulatory Visit (INDEPENDENT_AMBULATORY_CARE_PROVIDER_SITE_OTHER): Payer: Medicare Other | Admitting: Obstetrics and Gynecology

## 2016-06-11 VITALS — BP 105/76 | HR 114 | Ht 64.0 in | Wt 131.7 lb

## 2016-06-11 DIAGNOSIS — N809 Endometriosis, unspecified: Secondary | ICD-10-CM | POA: Diagnosis not present

## 2016-06-11 DIAGNOSIS — Z30430 Encounter for insertion of intrauterine contraceptive device: Secondary | ICD-10-CM

## 2016-06-11 DIAGNOSIS — N920 Excessive and frequent menstruation with regular cycle: Secondary | ICD-10-CM | POA: Diagnosis not present

## 2016-06-11 NOTE — Progress Notes (Signed)
     GYNECOLOGY OFFICE PROCEDURE NOTE  Grace Norris is a 26 y.o. G1P0010 here for [place] IUD insertion. No GYN concerns.  Last pap smear was on 12/2015 and was normal.  Patient's last menstrual period was 06/11/2016.   IUD Insertion Procedure Note Patient identified, informed consent performed, consent signed.   Discussed risks of irregular bleeding, cramping, infection, malpositioning or misplacement of the IUD outside the uterus which may require further procedure such as laparoscopy. Time out was performed.  Urine pregnancy test not performed as patient on menses.  Speculum placed in the vagina.  Cervix visualized.  Cleaned with Betadine x 2.  Grasped anteriorly with a single tooth tenaculum.  Uterus sounded to 9 cm.  Liletta IUD placed per manufacturer's recommendations.  Strings trimmed to 3 cm. Tenaculum was removed, good hemostasis noted.  Patient tolerated procedure well.   Patient was given post-procedure instructions.  She was advised to have backup contraception for one week.  Patient was also asked to check IUD strings periodically and follow up in 4 weeks for IUD check.   Lot: [id number] Exp: 03/2019   Rubie Maid, MD [facility]

## 2016-06-18 ENCOUNTER — Encounter: Payer: Self-pay | Admitting: Obstetrics and Gynecology

## 2016-07-09 ENCOUNTER — Encounter: Payer: Self-pay | Admitting: Obstetrics and Gynecology

## 2016-07-09 ENCOUNTER — Ambulatory Visit (INDEPENDENT_AMBULATORY_CARE_PROVIDER_SITE_OTHER): Payer: Medicare Other | Admitting: Obstetrics and Gynecology

## 2016-07-09 VITALS — BP 140/76 | HR 110 | Ht 64.0 in | Wt 129.1 lb

## 2016-07-09 DIAGNOSIS — N809 Endometriosis, unspecified: Secondary | ICD-10-CM | POA: Diagnosis not present

## 2016-07-09 DIAGNOSIS — Z30431 Encounter for routine checking of intrauterine contraceptive device: Secondary | ICD-10-CM

## 2016-07-09 NOTE — Progress Notes (Signed)
     GYNECOLOGY OFFICE PROGRESS NOTE  History:  26 y.o. G1P0010 here today for today for IUD string check; Liletta IUD was placed  1 month ago. No complaints about the IUD, no concerning side effects.  The following portions of the patient's history were reviewed and updated as appropriate: allergies, current medications, past family history, past medical history, past social history, past surgical history and problem list. Last pap smear on 8/172017 was normal, negative HRHPV.  Review of Systems:  Pertinent items are noted in HPI.   Objective:  Physical Exam Blood pressure 140/76, pulse (!) 110, height 5\' 4"  (1.626 m), weight 129 lb 1.6 oz (58.6 kg), last menstrual period 06/11/2016. CONSTITUTIONAL: Well-developed, well-nourished female in no acute distress.  ABDOMEN: Soft, no distention noted.   PELVIC: Normal appearing external genitalia; normal appearing vaginal mucosa and cervix.  IUD strings visualized, about 4 cm in length outside cervix.  EXTREMITIES: Non-tender, no edema or cyanosis NEUROLOGIC: grossly normal   Assessment & Plan:  Normal IUD check. Patient to keep IUD in place for five years; can come in for removal if she desires pregnancy within the next five years. Routine preventative health maintenance measures emphasized.   Rubie Maid, MD [facility]

## 2016-07-13 ENCOUNTER — Encounter: Payer: Self-pay | Admitting: Obstetrics and Gynecology

## 2016-07-15 ENCOUNTER — Encounter: Payer: Self-pay | Admitting: Obstetrics and Gynecology

## 2016-08-09 ENCOUNTER — Encounter: Payer: Self-pay | Admitting: Obstetrics and Gynecology

## 2016-08-12 ENCOUNTER — Telehealth: Payer: Self-pay | Admitting: Obstetrics and Gynecology

## 2016-08-12 DIAGNOSIS — Z7689 Persons encountering health services in other specified circumstances: Secondary | ICD-10-CM

## 2016-08-12 NOTE — Telephone Encounter (Signed)
Pt needs a referral placed to an OBGYN with [facility] but in [place], that accepts both of her insurances, the Medicare and [place]. Per a previous conversation, Dr. Marcelline Mates told her that she would refer her if needed. Please advise.

## 2016-08-12 NOTE — Telephone Encounter (Signed)
Referral placed for GYN in [facility]

## 2016-08-25 ENCOUNTER — Encounter: Payer: Medicare Other | Admitting: Obstetrics and Gynecology

## 2016-08-28 ENCOUNTER — Emergency Department
Admission: EM | Admit: 2016-08-28 | Discharge: 2016-08-28 | Disposition: A | Discharge status: Home / Self Care | Payer: Medicare Other | Attending: Emergency Medicine | Admitting: Emergency Medicine

## 2016-08-28 ENCOUNTER — Emergency Department: Payer: Medicare Other

## 2016-08-28 ENCOUNTER — Encounter: Payer: Self-pay | Admitting: Emergency Medicine

## 2016-08-28 DIAGNOSIS — J45909 Unspecified asthma, uncomplicated: Secondary | ICD-10-CM | POA: Diagnosis not present

## 2016-08-28 DIAGNOSIS — Z87891 Personal history of nicotine dependence: Secondary | ICD-10-CM | POA: Insufficient documentation

## 2016-08-28 DIAGNOSIS — Z79899 Other long term (current) drug therapy: Secondary | ICD-10-CM | POA: Insufficient documentation

## 2016-08-28 DIAGNOSIS — Z91040 Latex allergy status: Secondary | ICD-10-CM | POA: Diagnosis not present

## 2016-08-28 DIAGNOSIS — R102 Pelvic and perineal pain: Secondary | ICD-10-CM | POA: Insufficient documentation

## 2016-08-28 DIAGNOSIS — N189 Chronic kidney disease, unspecified: Secondary | ICD-10-CM | POA: Insufficient documentation

## 2016-08-28 DIAGNOSIS — R1030 Lower abdominal pain, unspecified: Secondary | ICD-10-CM | POA: Diagnosis present

## 2016-08-28 LAB — CBC
HEMATOCRIT: 40.7 % (ref 35.0–47.0)
HEMOGLOBIN: 14.3 g/dL (ref 12.0–16.0)
MCH: 29.6 pg (ref 26.0–34.0)
MCHC: 35.1 g/dL (ref 32.0–36.0)
MCV: 84.5 fL (ref 80.0–100.0)
PLATELETS: 225 10*3/uL (ref 150–440)
RBC: 4.81 MIL/uL (ref 3.80–5.20)
RDW: 12.8 % (ref 11.5–14.5)
WBC: 6.4 10*3/uL (ref 3.6–11.0)

## 2016-08-28 LAB — WET PREP, GENITAL
Clue Cells Wet Prep HPF POC: NONE SEEN
SPERM: NONE SEEN
Trich, Wet Prep: NONE SEEN
Yeast Wet Prep HPF POC: NONE SEEN

## 2016-08-28 LAB — COMPREHENSIVE METABOLIC PANEL
ALT: 22 U/L (ref 14–54)
ANION GAP: 5 (ref 5–15)
AST: 20 U/L (ref 15–41)
Albumin: 4.6 g/dL (ref 3.5–5.0)
Alkaline Phosphatase: 40 U/L (ref 38–126)
BUN: 16 mg/dL (ref 6–20)
CHLORIDE: 106 mmol/L (ref 101–111)
CO2: 28 mmol/L (ref 22–32)
CREATININE: 0.6 mg/dL (ref 0.44–1.00)
Calcium: 9.4 mg/dL (ref 8.9–10.3)
GFR calc non Af Amer: >60 mL/min (ref >60)
Glucose, Bld: 90 mg/dL (ref 65–99)
POTASSIUM: 4.3 mmol/L (ref 3.5–5.1)
SODIUM: 139 mmol/L (ref 135–145)
Total Bilirubin: 0.7 mg/dL (ref 0.3–1.2)
Total Protein: 7.5 g/dL (ref 6.5–8.1)

## 2016-08-28 LAB — URINALYSIS, COMPLETE (UACMP) WITH MICROSCOPIC
BILIRUBIN URINE: NEGATIVE
Bacteria, UA: NONE SEEN
GLUCOSE, UA: NEGATIVE mg/dL
Hgb urine dipstick: NEGATIVE
KETONES UR: NEGATIVE mg/dL
LEUKOCYTES UA: NEGATIVE
Nitrite: NEGATIVE
PH: 5 (ref 5.0–8.0)
PROTEIN: NEGATIVE mg/dL
RBC / HPF: NONE SEEN RBC/hpf (ref 0–5)
Specific Gravity, Urine: 1.024 (ref 1.005–1.030)

## 2016-08-28 LAB — CHLAMYDIA/NGC RT PCR (ARMC ONLY)
Chlamydia Tr: NOT DETECTED
N GONORRHOEAE: NOT DETECTED

## 2016-08-28 LAB — POCT PREGNANCY, URINE: Preg Test, Ur: NEGATIVE

## 2016-08-28 LAB — LIPASE, BLOOD: LIPASE: 26 U/L (ref 11–51)

## 2016-08-28 MED ORDER — TRAMADOL HCL 50 MG PO TABS: 50.0000 mg | ORAL_TABLET | Freq: Four times a day (QID) | ORAL | 0 refills | Status: DC | PRN

## 2016-08-28 MED ORDER — TRAMADOL HCL 50 MG PO TABS
50.0000 mg | ORAL_TABLET | Freq: Once | ORAL | Status: AC
  Administered 2016-08-28: 50 mg via ORAL

## 2016-08-28 MED ORDER — TRAMADOL HCL 50 MG PO TABS
ORAL_TABLET | ORAL | Status: AC
  Filled 2016-08-28: qty 1

## 2016-08-28 NOTE — ED Provider Notes (Signed)
[facility] Emergency Department Provider Note   ____________________________________________    I have reviewed the triage vital signs and the nursing notes.   HISTORY  Chief Complaint Abdominal Pain     HPI Grace Norris is a 26 y.o. female who presents with complaints of lower abdominal pain. Patient reports a history of endometriosis and reports her pain today feels similar. She had an appointment with Dr. Marcelline Mates her gynecologist 3 days ago but missed it. She denies fevers or chills. No nausea or vomiting. No vaginal discharge. No vaginal bleeding. She does have an IUD   Past Medical History:  Diagnosis Date  . Anemia   . Asthma    WELL CONTROLLED  . Chronic kidney disease    H/O STONES  . 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 Incidental finding noted on CT scan  . Endometriosis 03/19/2016  . Epilepsy ([place])   . Migraines   . Seizures ([place]) 2016   LAST SEIZURE 1-1.5 YRS AGO  . Sleep apnea    DOES NOT USE CPAP    Patient Active Problem List   Diagnosis Date Noted  . Endometriosis 03/19/2016  . Family history of cancer 10/12/2015  . Pelvic pain in female 10/12/2015    Past Surgical History:  Procedure Laterality Date  . HAND TENDON SURGERY Bilateral 11/2015  . LAPAROSCOPY N/A 03/09/2016   Procedure: LAPAROSCOPY DIAGNOSTIC and D AND C;  Surgeon: Rubie Maid, MD;  Location: [facility] ORS;  Service: Gynecology;  Laterality: N/A;    Prior to Admission medications   Medication Sig Start Date End Date Taking? Authorizing Provider  acetaminophen (TYLENOL 8 HOUR) 650 MG CR tablet Take 1 tablet (650 mg total) by mouth every 8 (eight) hours as needed for pain (mild). 03/09/16   Rubie Maid, MD  albuterol-ipratropium (COMBIVENT) 18-103 MCG/ACT inhaler Inhale 2 puffs into the lungs every 4 (four) hours as needed for wheezing or shortness of breath.    Historical Provider, MD  azelastine (OPTIVAR) 0.05 % ophthalmic  solution INSTILL 1 DROP INTO BOTH EYES TWICE A DAY 04/29/16   Historical Provider, MD  baclofen (LIORESAL) 10 MG tablet TAKE 1/2 TO 1 TABLET BY MOUTH EVERY 8 HOURS AS NEEDED FOR MUSCLE SPASM 06/20/16   Historical Provider, MD  brompheniramine-pseudoephedrine-DM 30-2-10 MG/5ML syrup TAKE 10MLS BY MOUTH EVERY 6 HOURS 04/06/16   Historical Provider, MD  docusate sodium (COLACE) 100 MG capsule Take 1 capsule (100 mg total) by mouth 2 (two) times daily as needed for mild constipation. 03/09/16   Rubie Maid, MD  levETIRAcetam (KEPPRA) 500 MG tablet Take by mouth. 06/02/16 06/02/17  Historical Provider, MD  Levonorgestrel (LILETTA, 52 MG, IU) by Intrauterine route.    Historical Provider, MD  lidocaine (XYLOCAINE) 2 % solution TAKE 5 MLS BY MOUTH EVERY 6 HOURS AS NEEDED FOR MOUTH PAIN.MIX 5 MLS WITH BROMFED 02/11/16   Historical Provider, MD  Multiple Vitamin (MULTI-VITAMINS) TABS Take by mouth.    Historical Provider, MD  nortriptyline (PAMELOR) 10 MG capsule Take by mouth. 06/02/16 06/02/17  Historical Provider, MD  prednisoLONE acetate (PRED FORTE) 1 % ophthalmic suspension 1 DROP BOTH EYES 4X/DAY X 7 DAYS, 3X/DAY X 7 DAYS, 2X/DAY X 7 DAYS, 1X/DAY X 7 DAYS 06/25/16   Historical Provider, MD  rizatriptan (MAXALT-MLT) 10 MG disintegrating tablet TAKE 1 TABLET BY MOUTH ONCE FOR MIGRAINE AND MAY TAKE SECOND DOSE AFTER 2 HOURS AS NEEDED 06/19/16   Historical Provider, MD  traMADol (ULTRAM) 50 MG tablet  Take 1 tablet (50 mg total) by mouth every 6 (six) hours as needed. 08/28/16 08/28/17  Lavonia Drafts, MD  XIIDRA 5 % SOLN INSTILL 1 DROP INTO BOTH EYES TWICE A DAY 07/02/16   Historical Provider, MD     Allergies Aspartame; Prednisone; Ibuprofen; Latex; and Penicillins  Family History  Problem Relation Age of Onset  . Scoliosis Brother   . Fibromyalgia Mother   . Cancer Mother 53    ovarian; s/p partial hysterectomy with RSO  . Graves' disease Mother   . Alzheimer's disease Other   . Cancer Maternal Grandmother 30     breast cancer (unilateral lumpectomy)  . Breast cancer Maternal Grandmother     Social History Social History  Substance Use Topics  . Smoking status: Former Smoker    Packs/day: 0.50    Years: 1.50    Types: Cigarettes    Quit date: 03/05/2011  . Smokeless tobacco: Never Used  . Alcohol use No    Review of Systems  Constitutional: No fever/chills  Cardiovascular: Denies chest pain. Respiratory: Denies shortness of breath. Gastrointestinal: As above Genitourinary: Negative for dysuria. No vaginal bleeding Musculoskeletal: Negative for back pain. Skin: Negative for rash. Neurological: Negative for headaches  10-point ROS otherwise negative.  ____________________________________________   PHYSICAL EXAM:  VITAL SIGNS: ED Triage Vitals [08/28/16 1250]  Enc Vitals Group     BP 116/78     Pulse Rate 94     Resp 18     Temp 99.5 F (37.5 C)     Temp Source Oral     SpO2 100 %     Weight 129 lb (58.5 kg)     Height 5\' 4"  (1.626 m)     Head Circumference      Peak Flow      Pain Score 10     Pain Loc      Pain Edu?      Excl. in [place]?     Constitutional: Alert and oriented. No acute distress. Pleasant and interactive Eyes: Conjunctivae are normal.   Nose: No congestion/rhinnorhea. Mouth/Throat: Mucous membranes are moist.    Cardiovascular: Normal rate, regular rhythm. Grossly normal heart sounds.  Good peripheral circulation. Respiratory: Normal respiratory effort.  No retractions. Lungs CTAB. Gastrointestinal: Soft and nontender. No distention.  No CVA tenderness. Genitourinary: No cervicitis, no CMT, small amount of whitish discharge from os, IUD string noted Musculoskeletal:  Warm and well perfused Neurologic:  Normal speech and language. No gross focal neurologic deficits are appreciated.  Skin:  Skin is warm, dry and intact. No rash noted. Psychiatric: Mood and affect are normal. Speech and behavior are  normal.  ____________________________________________   LABS (all labs ordered are listed, but only abnormal results are displayed)  Labs Reviewed  WET PREP, GENITAL - Abnormal; Notable for the following:       Result Value   WBC, Wet Prep HPF POC MODERATE (*)    All other components within normal limits  URINALYSIS, COMPLETE (UACMP) WITH MICROSCOPIC - Abnormal; Notable for the following:    Color, Urine YELLOW (*)    APPearance HAZY (*)    Squamous Epithelial / LPF 0-5 (*)    All other components within normal limits  CHLAMYDIA/NGC RT PCR ([facility] ONLY)  LIPASE, BLOOD  COMPREHENSIVE METABOLIC PANEL  CBC  POCT PREGNANCY, URINE  POC URINE PREG, ED   ____________________________________________  EKG  None ____________________________________________  RADIOLOGY  Ultrasound unremarkable ____________________________________________   PROCEDURES  Procedure(s) performed: No  Critical Care performed: No ____________________________________________   INITIAL IMPRESSION / ASSESSMENT AND PLAN / ED COURSE  Pertinent labs & imaging results that were available during my care of the patient were reviewed by me and considered in my medical decision making (see chart for details).  Patient well-appearing and in no acute distress. Vital signs are unremarkable. Lab work is reassuring. Benign exam. Ultrasound unremarkable. Suspect this pain is related to her endometriosis, treated with tramadol by mouth. She did have significant improvement. Recommended follow-up with Dr. Marcelline Mates    ____________________________________________   FINAL CLINICAL IMPRESSION(S) / ED DIAGNOSES  Final diagnoses:  Pelvic pain  Pelvic pain in female      NEW MEDICATIONS STARTED DURING THIS VISIT:  Discharge Medication List as of 08/28/2016  5:21 PM       Note:  This document was prepared using Dragon voice recognition software and may include unintentional dictation errors.     Lavonia Drafts, MD 08/28/16 [phone]

## 2016-08-28 NOTE — ED Triage Notes (Signed)
Pt to ed with c/o abd pain and cramping.  States hx of endometriosis, reports had appt with dr Marcelline Mates on Tuesday but did not go due to abd pain.

## 2016-08-30 IMAGING — CT CT ABD-PELV W/ CM
1 of 2 series · 15 of 32 positions shown, 19 images · IV contrast (iopamidol)
Comparison: None.

CLINICAL DATA: Abdominal pain x2 weeks, nausea

EXAM:
CT ABDOMEN AND PELVIS WITH CONTRAST
TECHNIQUE: Multidetector CT imaging of the abdomen and pelvis was performed
using the standard protocol following bolus administration of
intravenous contrast.
CONTRAST:  100mL LO0ZVO-7FF IOPAMIDOL (LO0ZVO-7FF) INJECTION 61%

[Series 2: routine abd pel with · axial · 0.64mm/px · z∈[-537,-102]mm · 15 of 95 slices shown, 19 images]
[im 4/95  soft-tissue]
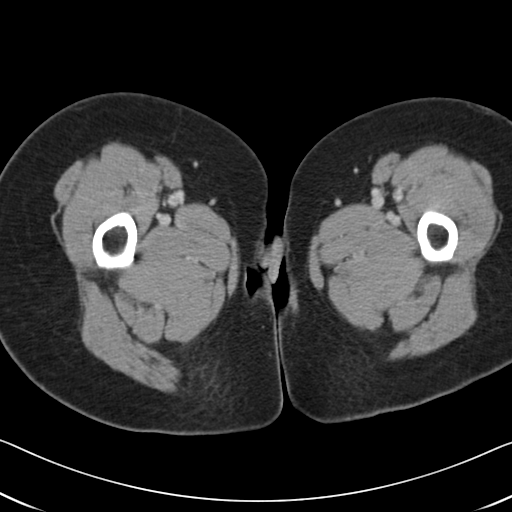
[im 4/95  bone]
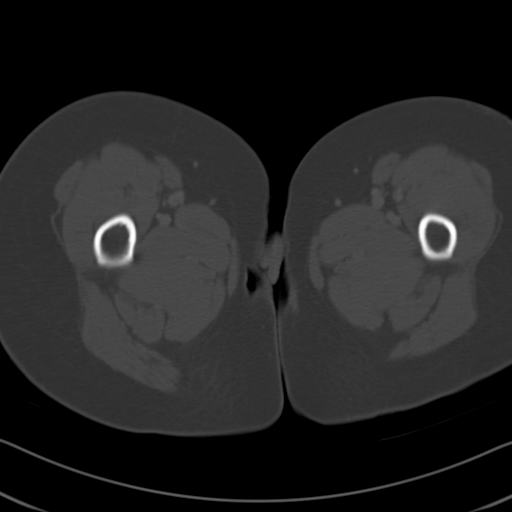
[im 12/95  soft-tissue]
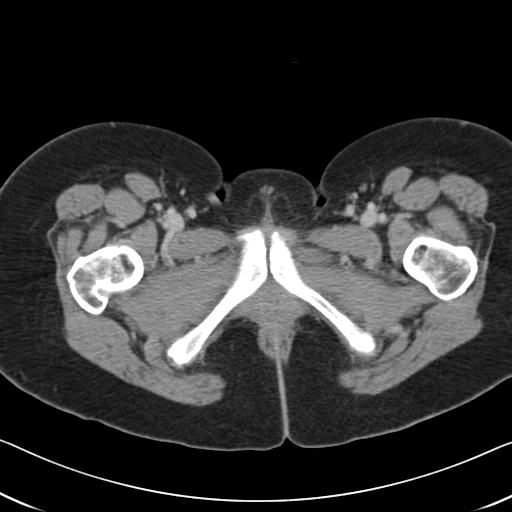
[im 19/95  soft-tissue]
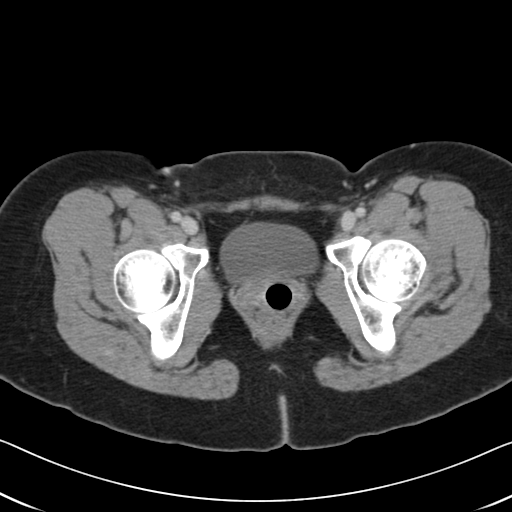
[im 27/95  soft-tissue]
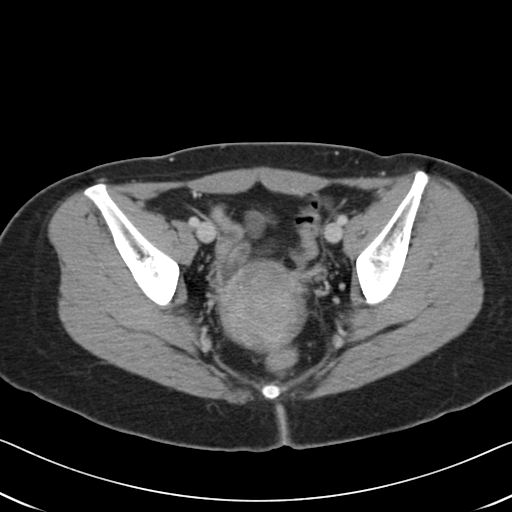
[im 34/95  soft-tissue]
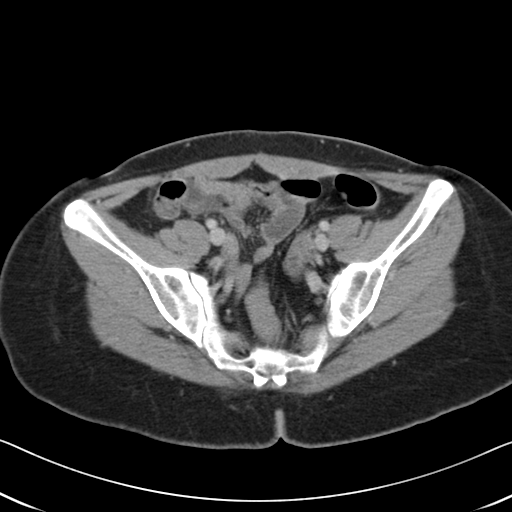
[im 42/95  soft-tissue]
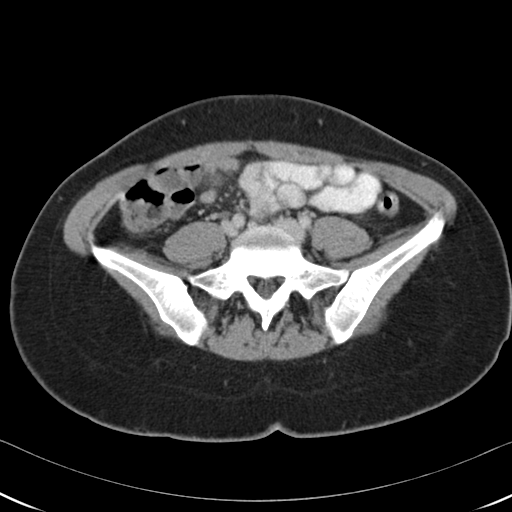
[im 49/95  soft-tissue]
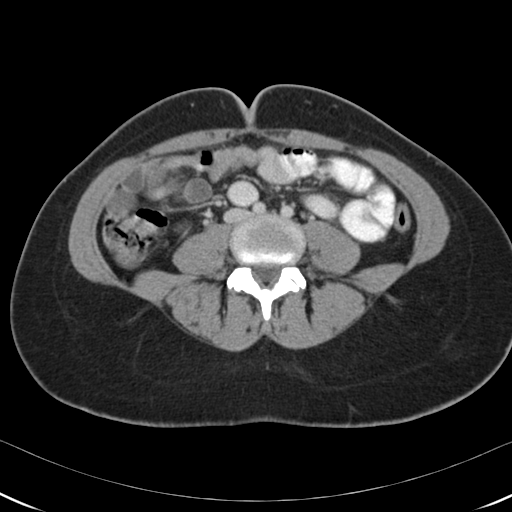
[im 53/95  soft-tissue]
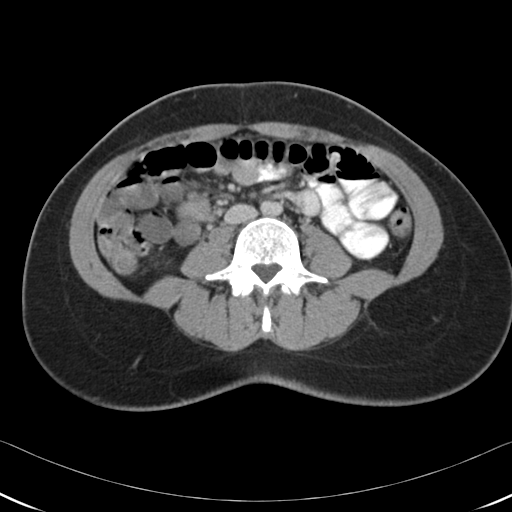
[im 61/95  soft-tissue]
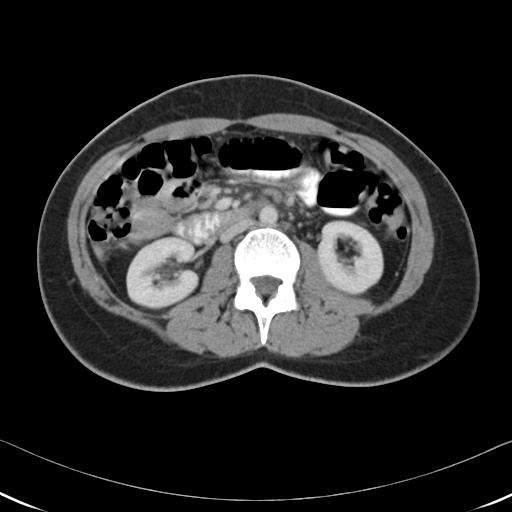
[im 61/95  bone]
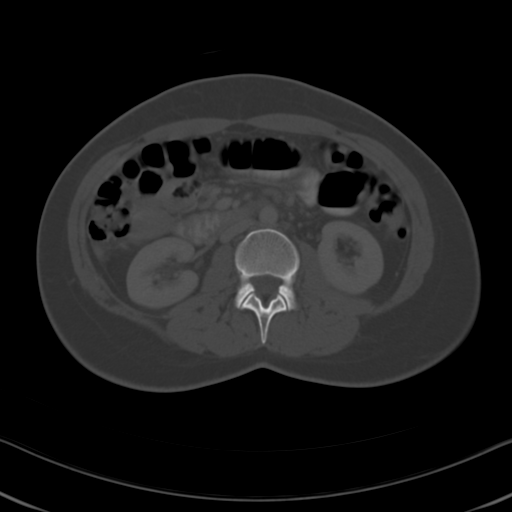
[im 68/95  soft-tissue]
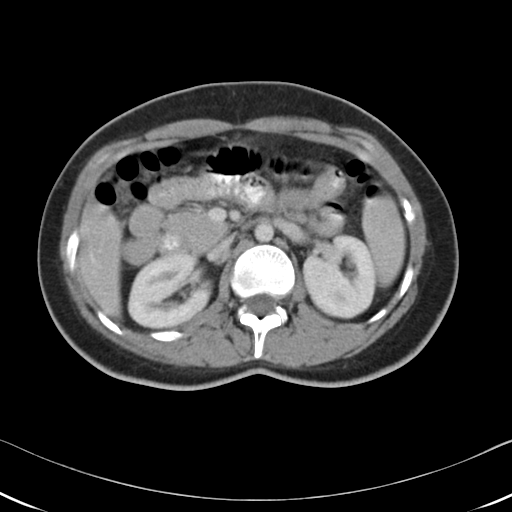
[im 76/95  soft-tissue]
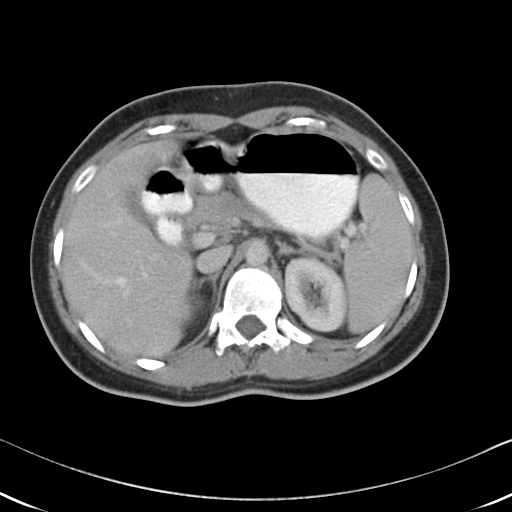
[im 79/95  lung]
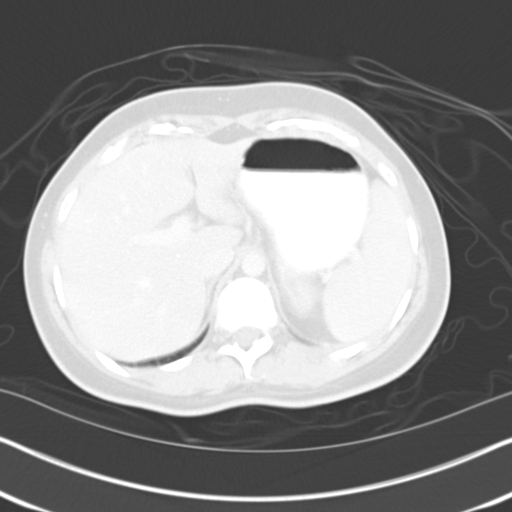
[im 83/95  soft-tissue]
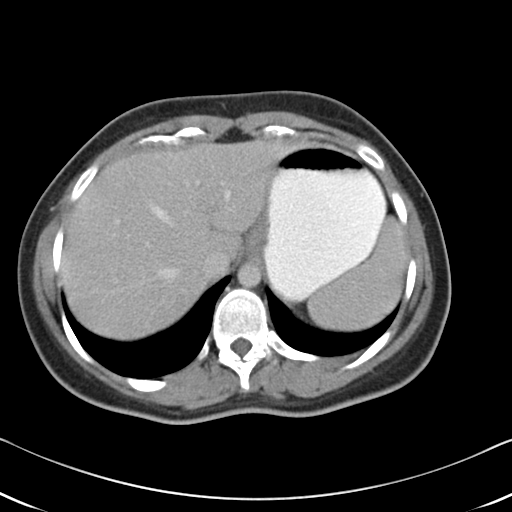
[im 83/95  lung]
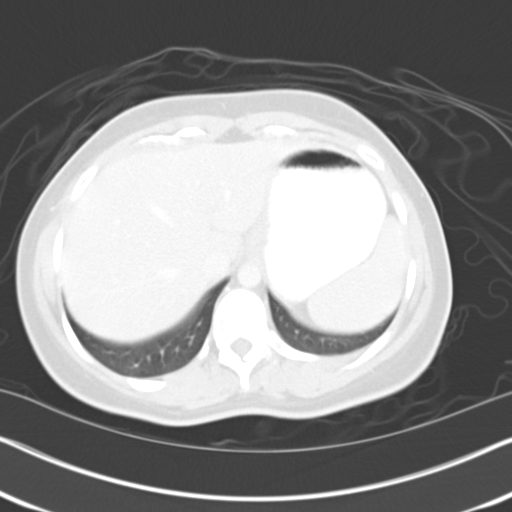
[im 87/95  lung]
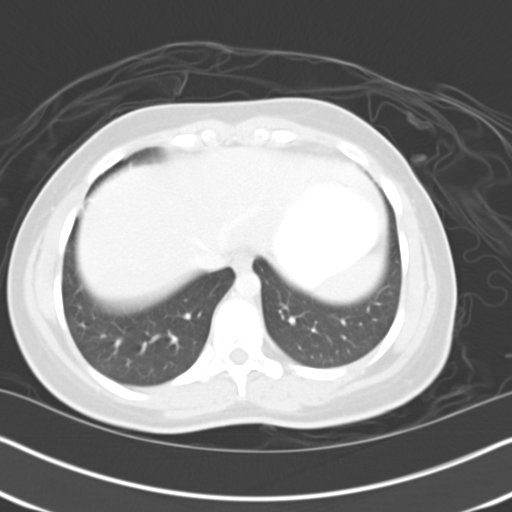
[im 91/95  soft-tissue]
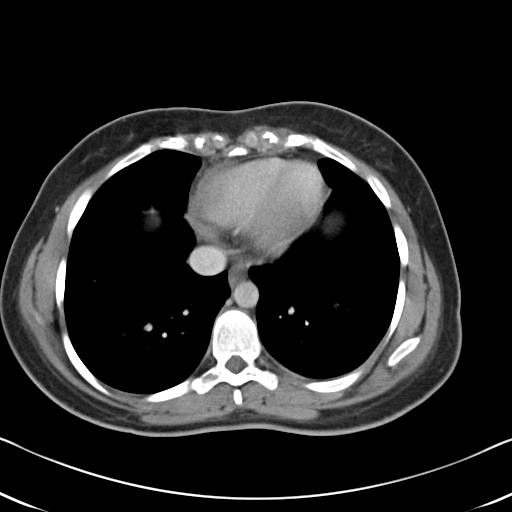
[im 91/95  lung]
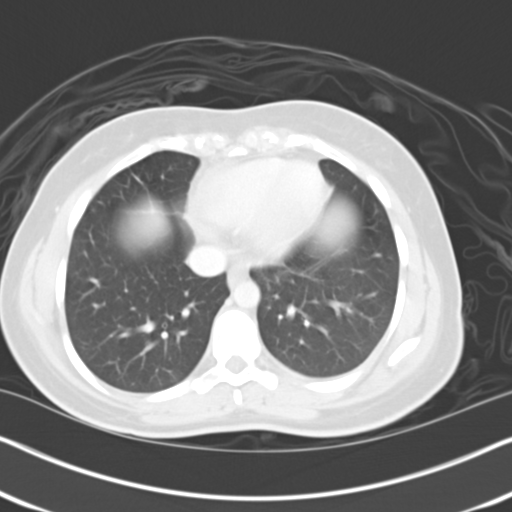

[15 of 32 positions shown; findings below may reference images not displayed]

FINDINGS: Lower chest:  Lung bases are clear.

Hepatobiliary: Liver is within normal limits.

Gallbladder is unremarkable. No intrahepatic or extrahepatic ductal
dilatation.

Pancreas: Within normal limits.

Spleen: Within normal limits.

Adrenals/Urinary Tract: Adrenal glands are within normal limits.

Kidneys are within normal limits.  No hydronephrosis.

Bladder is within normal limits.

Stomach/Bowel: Stomach is within normal limits.

No evidence of bowel obstruction.

Normal appendix (series 5/ image 44).

Vascular/Lymphatic: No evidence of abdominal aortic aneurysm.

No suspicious abdominopelvic lymphadenopathy.

Reproductive: Retroverted uterus.

Bilateral ovaries are within normal limits.

Other: No abdominopelvic ascites.

Musculoskeletal: Visualized osseous structures are within normal
limits.
IMPRESSION: No evidence of bowel obstruction.  Normal appendix.

No CT findings to account for the patient's abdominal pain.

## 2016-09-01 ENCOUNTER — Ambulatory Visit (INDEPENDENT_AMBULATORY_CARE_PROVIDER_SITE_OTHER): Payer: Medicare Other | Admitting: Obstetrics and Gynecology

## 2016-09-01 ENCOUNTER — Encounter: Payer: Self-pay | Admitting: Obstetrics and Gynecology

## 2016-09-01 VITALS — BP 110/80 | HR 92 | Ht 64.0 in | Wt 133.5 lb

## 2016-09-01 DIAGNOSIS — Z30432 Encounter for removal of intrauterine contraceptive device: Secondary | ICD-10-CM | POA: Diagnosis not present

## 2016-09-01 DIAGNOSIS — N809 Endometriosis, unspecified: Secondary | ICD-10-CM

## 2016-09-01 DIAGNOSIS — N921 Excessive and frequent menstruation with irregular cycle: Secondary | ICD-10-CM | POA: Diagnosis not present

## 2016-09-01 DIAGNOSIS — R102 Pelvic and perineal pain: Secondary | ICD-10-CM | POA: Diagnosis not present

## 2016-09-01 DIAGNOSIS — Z975 Presence of (intrauterine) contraceptive device: Secondary | ICD-10-CM

## 2016-09-01 MED ORDER — MEDROXYPROGESTERONE ACETATE 150 MG/ML IM SUSP: 150.0000 mg | INTRAMUSCULAR | 3 refills | Status: DC

## 2016-09-01 NOTE — Progress Notes (Signed)
GYNECOLOGY PROGRESS NOTE  Subjective:    Patient ID: Grace Norris, female    DOB: Jul 19, 1990, 26 y.o.   MRN: [id number]  HPI  Patient is a 26 y.o. G10P0010 female who presents for f/u from ER visit for complaints of pelvic pain and abnormal uterine bleeding.  Patient has a h/o endometriosis, and currently has a [place] IUD in place.  Notes that her most recent cycle lasted for 4 weeks.  Was seen in the ER due to complaints of persistent pelvic pain. Notes receiving a prescription for Tramadol for pain which patient notes has not been helping.  Has been alternating with Aleve.  Patient notes that she desires IUD removal and would like to discuss hysterectomy.    Patient also notes that her Nuerologist has her on a high dose of anti-seizure medication (1500 mg of Keppra).  Notes that she was told that that it would be dangerous for her to get pregnant, and with her h/o endometriosis would be "likely impossible".  Thus patient now considering hysterectomy.   The following portions of the patient's history were reviewed and updated as appropriate: allergies, current medications, past family history, past medical history, past social history, past surgical history and problem list.  Review of Systems Pertinent items noted in HPI and remainder of comprehensive ROS otherwise negative.   Objective:   Blood pressure 110/80, pulse 92, height 5\' 4"  (1.626 m), weight 133 lb 8 oz (60.6 kg), last menstrual period 09/01/2016. General appearance: alert and no distress Abdomen: soft, non-tender; bowel sounds normal; no masses,  no organomegaly Pelvic: external genitalia normal, rectovaginal septum normal.  Vagina without discharge.  Cervix normal appearing, no lesions and mild motion tenderness.  Uterus mobile, nontender, normal shape and size.  Adnexae non-palpable, nontender bilaterally.  Extremities: extremities normal, atraumatic, no cyanosis or edema Neurologic: Grossly normal  Assessment:    Pelvic pain in female  Endometriosis  Encounter for IUD removal  Breakthrough bleeding associated with intrauterine device (IUD)   Plan:    Discussion had with patient regarding management options for pelvic pain and endometriosis.  Patient has previously tried OCPs and now the IUD for her pain and endometriosis. Now desiring hysterectomy.  Advised that patient is young and in child-bearing years.  Although her Neurologist suggested that she should not become pregnant at this time, I discussed that patient may or may not require such high dose treatment in the future, and may be able to consider pregnancy at that time. Also noted that there are other treatments that can be tried prior to hysterectomy.  Discussed Danazol, Lupron, Depo Provera.  Also discussed a trial of OCPs in conjunction with IUD for 1-3 months to control breakthrough bleeding.  Breakthrough bleeding may be also related to high dose anti-seizure medication (as she was recently increased on dose last month, which is the same time abnormal bleeding started). Patient notes that she would like IUD removed today, and would like to try Depo Provera (notes being on Depo in the past for contraception and worked well for managing her cycles).  To return in 1-2 days for injection.    A total of 15 minutes were spent face-to-face with the patient during this encounter and over half of that time dealt with counseling and coordination of care.   IUD Removal  Patient identified, informed consent performed.  Patient was in the dorsal lithotomy position, normal external genitalia was noted.  A speculum was placed in the patient's vagina, normal discharge was  noted, no lesions. The cervix was visualized, no lesions, no abnormal discharge.  The strings of the IUD were grasped and pulled using ring forceps. The IUD was removed in its entirety.  Patient tolerated the procedure well.    Patient will use Depo Provera for contraception.  Routine  preventative health maintenance measures emphasized.    Rubie Maid, MD [facility]

## 2016-09-03 ENCOUNTER — Ambulatory Visit (INDEPENDENT_AMBULATORY_CARE_PROVIDER_SITE_OTHER): Payer: Medicare Other | Admitting: Obstetrics and Gynecology

## 2016-09-03 VITALS — BP 120/82 | HR 92 | Ht 64.0 in | Wt 133.2 lb

## 2016-09-03 DIAGNOSIS — Z30013 Encounter for initial prescription of injectable contraceptive: Secondary | ICD-10-CM | POA: Diagnosis not present

## 2016-09-03 MED ORDER — MEDROXYPROGESTERONE ACETATE 150 MG/ML IM SUSP
150.0000 mg | Freq: Once | INTRAMUSCULAR | Status: AC
  Administered 2016-09-03: 150 mg via INTRAMUSCULAR

## 2016-09-03 NOTE — Progress Notes (Signed)
Date last pap: 01/16/2016 Last Depo-Provera: First injection today Side Effects if any: N/A Serum HCG indicated? No, pt currently on menses, in addition had IUD removed 1 day ago. Depo-Provera 150 mg IM given by: Corliss Blacker, CMA Next appointment due: June 26-July 5 Pt tolerated well.

## 2016-09-03 NOTE — Patient Instructions (Signed)

## 2016-09-05 ENCOUNTER — Encounter: Payer: Self-pay | Admitting: Emergency Medicine

## 2016-09-05 ENCOUNTER — Emergency Department
Admission: EM | Admit: 2016-09-05 | Discharge: 2016-09-05 | Disposition: A | Discharge status: Home / Self Care | Payer: Medicare Other | Attending: Emergency Medicine | Admitting: Emergency Medicine

## 2016-09-05 DIAGNOSIS — R05 Cough: Secondary | ICD-10-CM | POA: Diagnosis present

## 2016-09-05 DIAGNOSIS — J040 Acute laryngitis: Secondary | ICD-10-CM | POA: Diagnosis not present

## 2016-09-05 DIAGNOSIS — F458 Other somatoform disorders: Secondary | ICD-10-CM | POA: Diagnosis not present

## 2016-09-05 DIAGNOSIS — N189 Chronic kidney disease, unspecified: Secondary | ICD-10-CM | POA: Diagnosis not present

## 2016-09-05 DIAGNOSIS — Z87891 Personal history of nicotine dependence: Secondary | ICD-10-CM | POA: Diagnosis not present

## 2016-09-05 DIAGNOSIS — J45909 Unspecified asthma, uncomplicated: Secondary | ICD-10-CM | POA: Diagnosis not present

## 2016-09-05 DIAGNOSIS — R0989 Other specified symptoms and signs involving the circulatory and respiratory systems: Secondary | ICD-10-CM

## 2016-09-05 DIAGNOSIS — Z79899 Other long term (current) drug therapy: Secondary | ICD-10-CM | POA: Insufficient documentation

## 2016-09-05 MED ORDER — LIDOCAINE VISCOUS 2 % MT SOLN
15.0000 mL | Freq: Once | OROMUCOSAL | Status: AC
  Administered 2016-09-05: 15 mL via OROMUCOSAL
  Filled 2016-09-05: qty 15

## 2016-09-05 MED ORDER — BENZONATATE 100 MG PO CAPS: ORAL_CAPSULE | ORAL | 0 refills | Status: DC

## 2016-09-05 MED ORDER — PANTOPRAZOLE SODIUM 40 MG PO TBEC: 40.0000 mg | DELAYED_RELEASE_TABLET | Freq: Every day | ORAL | 0 refills | Status: DC

## 2016-09-05 NOTE — ED Notes (Signed)
Pt. Going home with finance.

## 2016-09-05 NOTE — ED Triage Notes (Signed)
Pt to ED via POV, states that she has had a cough for a few hours. Pt states that she is unable to get anything up when she cough. Pt in NAD in triage.

## 2016-09-05 NOTE — Discharge Instructions (Signed)
Your exam is essentially normal today. You do not have a exam consistent with tonsillitis, but more laryngitis. Take the prescription meds over the next week or so as directed. Follow-up with your provider for continued symptoms.

## 2016-09-06 NOTE — ED Provider Notes (Signed)
[facility] Emergency Department Provider Note ____________________________________________  Time seen: 1825  I have reviewed the triage vital signs and the nursing notes.  HISTORY  Chief Complaint  Cough  HPI Grace Norris is a 26 y.o. female presents to the ED for evaluation of an intermittent cough for the last few hours. She reports the cough is in response to a sense of fullness to the throat. She describes a sense of swelling to the throat. She also describes a scratchy soreness to the deeper throat. She denies fevers, chills, sweats, or vomiting. She has been able to control her secretions and is speaking in complete sentences.   Past Medical History:  Diagnosis Date  . Anemia   . Asthma    WELL CONTROLLED  . Chronic kidney disease    H/O STONES  . 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 Incidental finding noted on CT scan  . Endometriosis 03/19/2016  . Epilepsy ([place])   . Migraines   . Seizures ([place]) 2016   LAST SEIZURE 1-1.5 YRS AGO  . Sleep apnea    DOES NOT USE CPAP    Patient Active Problem List   Diagnosis Date Noted  . Endometriosis 03/19/2016  . Family history of cancer 10/12/2015  . Pelvic pain in female 10/12/2015    Past Surgical History:  Procedure Laterality Date  . HAND TENDON SURGERY Bilateral 11/2015  . LAPAROSCOPY N/A 03/09/2016   Procedure: LAPAROSCOPY DIAGNOSTIC and D AND C;  Surgeon: Rubie Maid, MD;  Location: [facility] ORS;  Service: Gynecology;  Laterality: N/A;    Prior to Admission medications   Medication Sig Start Date End Date Taking? Authorizing Provider  acetaminophen (TYLENOL 8 HOUR) 650 MG CR tablet Take 1 tablet (650 mg total) by mouth every 8 (eight) hours as needed for pain (mild). 03/09/16   Rubie Maid, MD  albuterol-ipratropium (COMBIVENT) 18-103 MCG/ACT inhaler Inhale 2 puffs into the lungs every 4 (four) hours as needed for wheezing or shortness of breath.    Historical Provider,  MD  baclofen (LIORESAL) 10 MG tablet TAKE 1/2 TO 1 TABLET BY MOUTH EVERY 8 HOURS AS NEEDED FOR MUSCLE SPASM 06/20/16   Historical Provider, MD  benzonatate (TESSALON PERLES) 100 MG capsule Take 1-2 tabs TID prn cough 09/05/16   Bailyn Spackman V Bacon Charina Fons, PA-C  HYDROcodone-acetaminophen (NORCO/VICODIN) 5-325 MG tablet Take 1 tablet by mouth every 6 (six) hours as needed. for pain 08/17/16   Historical Provider, MD  levETIRAcetam (KEPPRA) 750 MG tablet 1 tablet twice daily 08/04/16   Historical Provider, MD  medroxyPROGESTERone (DEPO-PROVERA) 150 MG/ML injection Inject 1 mL (150 mg total) into the muscle every 3 (three) months. 09/01/16   Rubie Maid, MD  Multiple Vitamin (MULTI-VITAMINS) TABS Take by mouth.    Historical Provider, MD  nortriptyline (PAMELOR) 10 MG capsule Take by mouth. 06/02/16 06/02/17  Historical Provider, MD  pantoprazole (PROTONIX) 40 MG tablet Take 1 tablet (40 mg total) by mouth daily. 09/05/16 09/05/17  Jeanmarie Mccowen V Bacon Jenniffer Vessels, PA-C  prednisoLONE acetate (PRED FORTE) 1 % ophthalmic suspension 1 DROP BOTH EYES 4X/DAY X 7 DAYS, 3X/DAY X 7 DAYS, 2X/DAY X 7 DAYS, 1X/DAY X 7 DAYS 06/25/16   Historical Provider, MD  rizatriptan (MAXALT-MLT) 10 MG disintegrating tablet TAKE 1 TABLET BY MOUTH ONCE FOR MIGRAINE AND MAY TAKE SECOND DOSE AFTER 2 HOURS AS NEEDED 06/19/16   Historical Provider, MD  traMADol (ULTRAM) 50 MG tablet Take 1 tablet (50 mg total) by mouth every 6 (six)  hours as needed. 08/28/16 08/28/17  Lavonia Drafts, MD  XIIDRA 5 % SOLN INSTILL 1 DROP INTO BOTH EYES TWICE A DAY 07/02/16   Historical Provider, MD    Allergies Aspartame; Prednisone; Ibuprofen; Latex; and Penicillins  Family History  Problem Relation Age of Onset  . Scoliosis Brother   . Fibromyalgia Mother   . Cancer Mother 35    ovarian; s/p partial hysterectomy with RSO  . Graves' disease Mother   . Alzheimer's disease Other   . Cancer Maternal Grandmother 30    breast cancer (unilateral lumpectomy)  . Breast cancer  Maternal Grandmother     Social History Social History  Substance Use Topics  . Smoking status: Former Smoker    Packs/day: 0.50    Years: 1.50    Types: Cigarettes    Quit date: 03/05/2011  . Smokeless tobacco: Never Used  . Alcohol use Yes     Comment: occ    Review of Systems  Constitutional: Negative for fever. Eyes: Negative for visual changes. ENT: Positive for sore throat. Cardiovascular: Negative for chest pain. Respiratory: Negative for shortness of breath. Gastrointestinal: Negative for abdominal pain, vomiting and diarrhea. Neurological: Negative for headaches, focal weakness or numbness. ____________________________________________  PHYSICAL EXAM:  VITAL SIGNS: ED Triage Vitals  Enc Vitals Group     BP 09/05/16 1754 122/68     Pulse Rate 09/05/16 1754 82     Resp 09/05/16 1754 16     Temp 09/05/16 1754 98.8 F (37.1 C)     Temp Source 09/05/16 1754 Oral     SpO2 09/05/16 1754 98 %     Weight 09/05/16 1752 133 lb (60.3 kg)     Height --      Head Circumference --      Peak Flow --      Pain Score 09/05/16 1915 10     Pain Loc --      Pain Edu? --      Excl. in [place]? --    Constitutional: Alert and oriented. Well appearing and in no distress. Head: Normocephalic and atraumatic. Eyes: Conjunctivae are normal. PERRL. Normal extraocular movements Ears: Canals clear. TMs intact bilaterally. Nose: No congestion/rhinorrhea/epistaxis. Mouth/Throat: Mucous membranes are moist. Uvula is midline and tonsils are flat. There is mild cobblestone appearance to the posterior pharyngeal wall. Neck: Supple. No thyromegaly. Hematological/Lymphatic/Immunological: No cervical lymphadenopathy. Cardiovascular: Normal rate, regular rhythm. Normal distal pulses. Respiratory: Normal respiratory effort. No wheezes/rales/rhonchi. Gastrointestinal: Soft and nontender. No distention. Psychiatric: Mood and affect are normal. Patient exhibits appropriate insight and  judgment. ____________________________________________  PROCEDURES  Viscous lido 2% gargle ____________________________________________  INITIAL IMPRESSION / ASSESSMENT AND PLAN / ED COURSE  Patient with globus sensation to the throat. Her exam is benign in the ED. She is given a viscous lido gargle and discharged home with a prescription for Tessalon Perles for cough relief. She is advised to dose an allergy medicine for suspected post-nasal drainage. She is to follow-up with one of the local community clinics or [facility] for continued symptoms.  ____________________________________________  FINAL CLINICAL IMPRESSION(S) / ED DIAGNOSES  Final diagnoses:  Globus sensation  Laryngitis      Melvenia Needles, PA-C 09/06/16 1837    Orbie Pyo, MD 09/06/16 [phone]

## 2016-09-07 NOTE — Progress Notes (Signed)
I have reviewed the record and concur with patient management and plan.  Rubie Maid, MD [facility]

## 2016-09-12 ENCOUNTER — Encounter: Payer: Self-pay | Admitting: Emergency Medicine

## 2016-09-12 ENCOUNTER — Emergency Department
Admission: EM | Admit: 2016-09-12 | Discharge: 2016-09-12 | Disposition: A | Discharge status: Home / Self Care | Payer: Medicare Other | Attending: Emergency Medicine | Admitting: Emergency Medicine

## 2016-09-12 DIAGNOSIS — N189 Chronic kidney disease, unspecified: Secondary | ICD-10-CM | POA: Diagnosis not present

## 2016-09-12 DIAGNOSIS — G43709 Chronic migraine without aura, not intractable, without status migrainosus: Secondary | ICD-10-CM

## 2016-09-12 DIAGNOSIS — J45909 Unspecified asthma, uncomplicated: Secondary | ICD-10-CM | POA: Insufficient documentation

## 2016-09-12 DIAGNOSIS — Z87891 Personal history of nicotine dependence: Secondary | ICD-10-CM | POA: Insufficient documentation

## 2016-09-12 DIAGNOSIS — Z79899 Other long term (current) drug therapy: Secondary | ICD-10-CM | POA: Diagnosis not present

## 2016-09-12 DIAGNOSIS — G43909 Migraine, unspecified, not intractable, without status migrainosus: Secondary | ICD-10-CM | POA: Diagnosis present

## 2016-09-12 MED ORDER — PROMETHAZINE HCL 25 MG/ML IJ SOLN
25.0000 mg | Freq: Once | INTRAMUSCULAR | Status: AC
Start: 2016-09-12 — End: 2016-09-12
  Administered 2016-09-12: 25 mg via INTRAMUSCULAR
  Filled 2016-09-12: qty 1

## 2016-09-12 MED ORDER — DIPHENHYDRAMINE HCL 50 MG/ML IJ SOLN
50.0000 mg | Freq: Once | INTRAMUSCULAR | Status: AC
  Administered 2016-09-12: 50 mg via INTRAMUSCULAR
  Filled 2016-09-12: qty 1

## 2016-09-12 MED ORDER — METHYLPREDNISOLONE SODIUM SUCC 125 MG IJ SOLR
125.0000 mg | Freq: Once | INTRAMUSCULAR | Status: AC
  Administered 2016-09-12: 125 mg via INTRAMUSCULAR
  Filled 2016-09-12: qty 2

## 2016-09-12 NOTE — ED Provider Notes (Signed)
[facility] Emergency Department Provider Note  ____________________________________________  Time seen: Approximately 7:55 PM  I have reviewed the triage vital signs and the nursing notes.   HISTORY  Chief Complaint Migraine    HPI Grace Norris is a 26 y.o. female who presents emergency department complaining of migraine headache. Patient reports that she has a history of migraines and this is consistent with both location and symptoms. She denies any changes in severity from baseline. Patient does have a history of epilepsy. She denies any seizure-like activity. Patient denies any numbness or tingling of any extremity. No nausea or emesis. No complaints at this time. No recent infections. No medications prior to arrival. Patient is followed by neurology in [facility] [place] patient reports she has had migraine cocktails in the past with good success.   Past Medical History:  Diagnosis Date  . Anemia   . Asthma    WELL CONTROLLED  . Chronic kidney disease    H/O STONES  . 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 Incidental finding noted on CT scan  . Endometriosis 03/19/2016  . Epilepsy ([place])   . Migraines   . Seizures ([place]) 2016   LAST SEIZURE 1-1.5 YRS AGO  . Sleep apnea    DOES NOT USE CPAP    Patient Active Problem List   Diagnosis Date Noted  . Endometriosis 03/19/2016  . Family history of cancer 10/12/2015  . Pelvic pain in female 10/12/2015    Past Surgical History:  Procedure Laterality Date  . HAND TENDON SURGERY Bilateral 11/2015  . LAPAROSCOPY N/A 03/09/2016   Procedure: LAPAROSCOPY DIAGNOSTIC and D AND C;  Surgeon: Rubie Maid, MD;  Location: [facility] ORS;  Service: Gynecology;  Laterality: N/A;    Prior to Admission medications   Medication Sig Start Date End Date Taking? Authorizing Provider  acetaminophen (TYLENOL 8 HOUR) 650 MG CR tablet Take 1 tablet (650 mg total) by mouth every 8 (eight) hours as needed for  pain (mild). 03/09/16   Rubie Maid, MD  albuterol-ipratropium (COMBIVENT) 18-103 MCG/ACT inhaler Inhale 2 puffs into the lungs every 4 (four) hours as needed for wheezing or shortness of breath.    Historical Provider, MD  baclofen (LIORESAL) 10 MG tablet TAKE 1/2 TO 1 TABLET BY MOUTH EVERY 8 HOURS AS NEEDED FOR MUSCLE SPASM 06/20/16   Historical Provider, MD  benzonatate (TESSALON PERLES) 100 MG capsule Take 1-2 tabs TID prn cough 09/05/16   Jenise V Bacon Menshew, PA-C  HYDROcodone-acetaminophen (NORCO/VICODIN) 5-325 MG tablet Take 1 tablet by mouth every 6 (six) hours as needed. for pain 08/17/16   Historical Provider, MD  levETIRAcetam (KEPPRA) 750 MG tablet 1 tablet twice daily 08/04/16   Historical Provider, MD  medroxyPROGESTERone (DEPO-PROVERA) 150 MG/ML injection Inject 1 mL (150 mg total) into the muscle every 3 (three) months. 09/01/16   Rubie Maid, MD  Multiple Vitamin (MULTI-VITAMINS) TABS Take by mouth.    Historical Provider, MD  nortriptyline (PAMELOR) 10 MG capsule Take by mouth. 06/02/16 06/02/17  Historical Provider, MD  pantoprazole (PROTONIX) 40 MG tablet Take 1 tablet (40 mg total) by mouth daily. 09/05/16 09/05/17  Jenise V Bacon Menshew, PA-C  prednisoLONE acetate (PRED FORTE) 1 % ophthalmic suspension 1 DROP BOTH EYES 4X/DAY X 7 DAYS, 3X/DAY X 7 DAYS, 2X/DAY X 7 DAYS, 1X/DAY X 7 DAYS 06/25/16   Historical Provider, MD  rizatriptan (MAXALT-MLT) 10 MG disintegrating tablet TAKE 1 TABLET BY MOUTH ONCE FOR MIGRAINE AND MAY TAKE SECOND DOSE AFTER  2 HOURS AS NEEDED 06/19/16   Historical Provider, MD  traMADol (ULTRAM) 50 MG tablet Take 1 tablet (50 mg total) by mouth every 6 (six) hours as needed. 08/28/16 08/28/17  Lavonia Drafts, MD  XIIDRA 5 % SOLN INSTILL 1 DROP INTO BOTH EYES TWICE A DAY 07/02/16   Historical Provider, MD    Allergies Aspartame; Prednisone; Ibuprofen; Latex; and Penicillins  Family History  Problem Relation Age of Onset  . Scoliosis Brother   . Fibromyalgia Mother   .  Cancer Mother 17    ovarian; s/p partial hysterectomy with RSO  . Graves' disease Mother   . Alzheimer's disease Other   . Cancer Maternal Grandmother 30    breast cancer (unilateral lumpectomy)  . Breast cancer Maternal Grandmother     Social History Social History  Substance Use Topics  . Smoking status: Former Smoker    Packs/day: 0.50    Years: 1.50    Types: Cigarettes    Quit date: 03/05/2011  . Smokeless tobacco: Never Used  . Alcohol use Yes     Comment: occ     Review of Systems  Constitutional: No fever/chills Eyes: No visual changes. No discharge ENT: No upper respiratory complaints. Cardiovascular: no chest pain. Respiratory: no cough. No SOB. Gastrointestinal: No abdominal pain.  No nausea, no vomiting.  Musculoskeletal: Negative for musculoskeletal pain. Skin: Negative for rash, abrasions, lacerations, ecchymosis. Neurological: Positive for migraine headache but denies focal weakness or numbness. 10-point ROS otherwise negative.  ____________________________________________   PHYSICAL EXAM:  VITAL SIGNS: ED Triage Vitals  Enc Vitals Group     BP 09/12/16 1846 112/80     Pulse --      Resp 09/12/16 1846 18     Temp 09/12/16 1846 98.4 F (36.9 C)     Temp src --      SpO2 09/12/16 1846 100 %     Weight 09/12/16 1847 131 lb (59.4 kg)     Height 09/12/16 1847 5\' 4"  (1.626 m)     Head Circumference --      Peak Flow --      Pain Score 09/12/16 1845 9     Pain Loc --      Pain Edu? --      Excl. in [place]? --      Constitutional: Alert and oriented. Well appearing and in no acute distress. Eyes: Conjunctivae are normal. PERRL. EOMI. Head: Atraumatic. ENT:      Ears:       Nose: No congestion/rhinnorhea.      Mouth/Throat: Mucous membranes are moist.  Neck: No stridor.    Cardiovascular: Normal rate, regular rhythm. Normal S1 and S2.  Good peripheral circulation. Respiratory: Normal respiratory effort without tachypnea or retractions. Lungs CTAB.  Good air entry to the bases with no decreased or absent breath sounds. Musculoskeletal: Full range of motion to all extremities. No gross deformities appreciated. Neurologic:  Normal speech and language. No gross focal neurologic deficits are appreciated. Patient with nystagmus, this is baseline, otherwise, cranial nerves grossly intact. Skin:  Skin is warm, dry and intact. No rash noted. Psychiatric: Mood and affect are normal. Speech and behavior are normal. Patient exhibits appropriate insight and judgement.   ____________________________________________   LABS (all labs ordered are listed, but only abnormal results are displayed)  Labs Reviewed - No data to display ____________________________________________  EKG   ____________________________________________  RADIOLOGY   No results found.  ____________________________________________    PROCEDURES  Procedure(s) performed:  Procedures    Medications  promethazine (PHENERGAN) injection 25 mg (25 mg Intramuscular Given 09/12/16 2012)  methylPREDNISolone sodium succinate (SOLU-MEDROL) 125 mg/2 mL injection 125 mg (125 mg Intramuscular Given 09/12/16 2012)  diphenhydrAMINE (BENADRYL) injection 50 mg (50 mg Intramuscular Given 09/12/16 2013)     ____________________________________________   INITIAL IMPRESSION / ASSESSMENT AND PLAN / ED COURSE  Pertinent labs & imaging results that were available during my care of the patient were reviewed by me and considered in my medical decision making (see chart for details).  Review of the [place] CSRS was performed in accordance of the [place] prior to dispensing any controlled drugs.     Patient's diagnosis is consistent with migraine headache. This isn't chronic, ongoing condition. Patient denies any change from baseline. No recent illness or infection. Exam is reassuring. The next time, no indication for imaging or labs. Patient is given migraine cocktail.. Patient will  follow-up with neurology as needed. Patient is given ED precautions to return to the ED for any worsening or new symptoms.     ____________________________________________  FINAL CLINICAL IMPRESSION(S) / ED DIAGNOSES  Final diagnoses:  Chronic migraine without aura without status migrainosus, not intractable      NEW MEDICATIONS STARTED DURING THIS VISIT:  New Prescriptions   No medications on file        This chart was dictated using voice recognition software/Dragon. Despite best efforts to proofread, errors can occur which can change the meaning. Any change was purely unintentional.    Darletta Moll, PA-C 09/12/16 2027    Eula Listen, MD 09/15/16 1511

## 2016-09-12 NOTE — ED Triage Notes (Signed)
Woke with migraine this am. History of same. Took usual migraine med without relief today.

## 2016-10-01 ENCOUNTER — Encounter: Payer: Self-pay | Admitting: Obstetrics and Gynecology

## 2016-10-05 DIAGNOSIS — G40209 Localization-related (focal) (partial) symptomatic epilepsy and epileptic syndromes with complex partial seizures, not intractable, without status epilepticus: Secondary | ICD-10-CM | POA: Insufficient documentation

## 2016-10-16 ENCOUNTER — Encounter: Payer: Self-pay | Admitting: Emergency Medicine

## 2016-10-16 ENCOUNTER — Emergency Department
Admission: EM | Admit: 2016-10-16 | Discharge: 2016-10-16 | Disposition: A | Discharge status: Home / Self Care | Payer: Medicare Other | Attending: Emergency Medicine | Admitting: Emergency Medicine

## 2016-10-16 DIAGNOSIS — Z91040 Latex allergy status: Secondary | ICD-10-CM | POA: Insufficient documentation

## 2016-10-16 DIAGNOSIS — N189 Chronic kidney disease, unspecified: Secondary | ICD-10-CM | POA: Diagnosis not present

## 2016-10-16 DIAGNOSIS — J45909 Unspecified asthma, uncomplicated: Secondary | ICD-10-CM | POA: Insufficient documentation

## 2016-10-16 DIAGNOSIS — R1032 Left lower quadrant pain: Secondary | ICD-10-CM | POA: Diagnosis present

## 2016-10-16 DIAGNOSIS — Z87891 Personal history of nicotine dependence: Secondary | ICD-10-CM | POA: Insufficient documentation

## 2016-10-16 DIAGNOSIS — R102 Pelvic and perineal pain: Secondary | ICD-10-CM

## 2016-10-16 LAB — CBC
HEMATOCRIT: 43 % (ref 35.0–47.0)
Hemoglobin: 14.9 g/dL (ref 12.0–16.0)
MCH: 29.3 pg (ref 26.0–34.0)
MCHC: 34.6 g/dL (ref 32.0–36.0)
MCV: 84.9 fL (ref 80.0–100.0)
PLATELETS: 248 10*3/uL (ref 150–440)
RBC: 5.07 MIL/uL (ref 3.80–5.20)
RDW: 12.5 % (ref 11.5–14.5)
WBC: 5.4 10*3/uL (ref 3.6–11.0)

## 2016-10-16 LAB — COMPREHENSIVE METABOLIC PANEL
ALBUMIN: 4.5 g/dL (ref 3.5–5.0)
ALT: 27 U/L (ref 14–54)
AST: 20 U/L (ref 15–41)
Alkaline Phosphatase: 41 U/L (ref 38–126)
Anion gap: 5 (ref 5–15)
BUN: 16 mg/dL (ref 6–20)
CHLORIDE: 107 mmol/L (ref 101–111)
CO2: 26 mmol/L (ref 22–32)
CREATININE: 0.66 mg/dL (ref 0.44–1.00)
Calcium: 9.5 mg/dL (ref 8.9–10.3)
GFR calc Af Amer: >60 mL/min (ref >60)
Glucose, Bld: 94 mg/dL (ref 65–99)
POTASSIUM: 4.2 mmol/L (ref 3.5–5.1)
Sodium: 138 mmol/L (ref 135–145)
Total Bilirubin: 0.5 mg/dL (ref 0.3–1.2)
Total Protein: 7.2 g/dL (ref 6.5–8.1)

## 2016-10-16 LAB — URINALYSIS, COMPLETE (UACMP) WITH MICROSCOPIC
BACTERIA UA: NONE SEEN
BILIRUBIN URINE: NEGATIVE
Glucose, UA: NEGATIVE mg/dL
Hgb urine dipstick: NEGATIVE
Ketones, ur: NEGATIVE mg/dL
Leukocytes, UA: NEGATIVE
Nitrite: NEGATIVE
PH: 5 (ref 5.0–8.0)
Protein, ur: NEGATIVE mg/dL
SPECIFIC GRAVITY, URINE: 1.02 (ref 1.005–1.030)
WBC, UA: NONE SEEN WBC/hpf (ref 0–5)

## 2016-10-16 LAB — PREGNANCY, URINE: PREG TEST UR: NEGATIVE

## 2016-10-16 LAB — LIPASE, BLOOD: LIPASE: 28 U/L (ref 11–51)

## 2016-10-16 MED ORDER — HYDROMORPHONE HCL 1 MG/ML IJ SOLN
1.0000 mg | Freq: Once | INTRAMUSCULAR | Status: AC
  Administered 2016-10-16: 1 mg via INTRAMUSCULAR
  Filled 2016-10-16: qty 1

## 2016-10-16 MED ORDER — OXYCODONE-ACETAMINOPHEN 5-325 MG PO TABS: 1.0000 | ORAL_TABLET | Freq: Four times a day (QID) | ORAL | 0 refills | Status: DC | PRN

## 2016-10-16 NOTE — ED Triage Notes (Signed)
Pt to ED c/o LLQ abdominal pain that has been going on for a while. Pt states that she has hx/o endometriosis and called to get an appt with her GYN but was told to come to ed if she was having severe pain.

## 2016-10-16 NOTE — ED Notes (Signed)
Patient inquiring about wait time.  Apologized for wait time and reassured patient that we will get her to see a physician as soon as possible.  Patient states "If my OB/GNY didn't work here, I would have carried my ass right to [facility], they are much faster there".  Again apologized for her wait.   Patient is AAOx3.  Skin warm and dry.  Posture upright and relaxed.  Ambulates with easy and steady gait.  NAD.

## 2016-10-16 NOTE — ED Provider Notes (Signed)
[facility] Emergency Department Provider Note       Time seen: ----------------------------------------- 2:19 PM on 10/16/2016 -----------------------------------------     I have reviewed the triage vital signs and the nursing notes.   HISTORY   Chief Complaint Abdominal Pain    HPI Grace Norris is a 26 y.o. female who presents to the ED for left lower quadrant abdominal pain has been going on for a while. Patient states she has a history of endometriosis and is called to get an appointment with her GYN doctor but was told to come to the ER if the pain had worsened. She has been on Depo-Provera injections but these interfered with her seizure medications. She has taken pain medicine in the past with relief.   Past Medical History:  Diagnosis Date  . Anemia   . Asthma    WELL CONTROLLED  . Chronic kidney disease    H/O STONES  . 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 Incidental finding noted on CT scan  . Endometriosis 03/19/2016  . Epilepsy ([place])   . Migraines   . Seizures ([place]) 2016   LAST SEIZURE 1-1.5 YRS AGO  . Sleep apnea    DOES NOT USE CPAP    Patient Active Problem List   Diagnosis Date Noted  . Endometriosis 03/19/2016  . Family history of cancer 10/12/2015  . Pelvic pain in female 10/12/2015    Past Surgical History:  Procedure Laterality Date  . HAND TENDON SURGERY Bilateral 11/2015  . LAPAROSCOPY N/A 03/09/2016   Procedure: LAPAROSCOPY DIAGNOSTIC and D AND C;  Surgeon: Rubie Maid, MD;  Location: [facility] ORS;  Service: Gynecology;  Laterality: N/A;    Allergies Aspartame; Prednisone; Ibuprofen; Latex; and Penicillins  Social History Social History  Substance Use Topics  . Smoking status: Former Smoker    Packs/day: 0.50    Years: 1.50    Types: Cigarettes    Quit date: 03/05/2011  . Smokeless tobacco: Never Used  . Alcohol use Yes     Comment: occ    Review of Systems Constitutional:  Negative for fever. Eyes: Negative for vision changes ENT:  Negative for congestion, sore throat Cardiovascular: Negative for chest pain. Respiratory: Negative for shortness of breath. Gastrointestinal: Positive for abdominal pain Genitourinary: Negative for dysuria. Musculoskeletal: Negative for back pain. Skin: Negative for rash. Neurological: Negative for headaches, focal weakness or numbness.  All systems negative/normal/unremarkable except as stated in the HPI  ____________________________________________   PHYSICAL EXAM:  VITAL SIGNS: ED Triage Vitals  Enc Vitals Group     BP 10/16/16 1025 120/80     Pulse Rate 10/16/16 1025 65     Resp 10/16/16 1025 16     Temp 10/16/16 1025 98.4 F (36.9 C)     Temp Source 10/16/16 1025 Oral     SpO2 10/16/16 1025 100 %     Weight --      Height --      Head Circumference --      Peak Flow --      Pain Score 10/16/16 1024 10     Pain Loc --      Pain Edu? --      Excl. in [place]? --     Constitutional: Alert and oriented. Well appearing and in no distress. Eyes: Conjunctivae are normal. Normal extraocular movements. ENT   Head: Normocephalic and atraumatic.   Nose: No congestion/rhinnorhea.   Mouth/Throat: Mucous membranes are moist.   Neck: No stridor.  Cardiovascular: Normal rate, regular rhythm. No murmurs, rubs, or gallops. Respiratory: Normal respiratory effort without tachypnea nor retractions. Breath sounds are clear and equal bilaterally. No wheezes/rales/rhonchi. Gastrointestinal: Nonfocal pelvic tenderness, no rebound or guarding. Normal bowel sounds. Musculoskeletal: Nontender with normal range of motion in extremities. No lower extremity tenderness nor edema. Neurologic:  Normal speech and language. No gross focal neurologic deficits are appreciated.  Skin:  Skin is warm, dry and intact. No rash noted. Psychiatric: Mood and affect are normal. Speech and behavior are normal.   ___________________________________________  ED COURSE:  Pertinent labs & imaging results that were available during my care of the patient were reviewed by me and considered in my medical decision making (see chart for details). Patient presents for pelvic pain, we will assess with labs and imaging as indicated.   Procedures ____________________________________________   LABS (pertinent positives/negatives)  Labs Reviewed  URINALYSIS, COMPLETE (UACMP) WITH MICROSCOPIC - Abnormal; Notable for the following:       Result Value   Color, Urine AMBER (*)    APPearance CLOUDY (*)    Squamous Epithelial / LPF 0-5 (*)    All other components within normal limits  LIPASE, BLOOD  COMPREHENSIVE METABOLIC PANEL  CBC   ____________________________________________  FINAL ASSESSMENT AND PLAN  Pelvic pain, likely endometriosis  Plan: Patient's labs were dictated above. Patient had presented for pelvic pain which is likely from endometriosis. She has a relatively benign examination and workup. She has been seen recently with extensive workup which was negative. She'll be discharged with a short supply pain medicine until she can see GYN.   Earleen Newport, MD   Note: This note was generated in part or whole with voice recognition software. Voice recognition is usually quite accurate but there are transcription errors that can and very often do occur. I apologize for any typographical errors that were not detected and corrected.     Earleen Newport, MD 10/16/16 [phone]

## 2016-10-16 NOTE — ED Notes (Signed)
REmains AAOx3.  Skin warm and dry.  NAD. Ambulates with easy and steady gait.  Posture upright and relaxed.  Reassured that CPod will be opening shortly.

## 2016-10-19 ENCOUNTER — Emergency Department
Admission: EM | Admit: 2016-10-19 | Discharge: 2016-10-19 | Disposition: A | Discharge status: Home / Self Care | Payer: Medicare Other | Attending: Emergency Medicine | Admitting: Emergency Medicine

## 2016-10-19 ENCOUNTER — Encounter: Payer: Self-pay | Admitting: Emergency Medicine

## 2016-10-19 DIAGNOSIS — T391X5A Adverse effect of 4-Aminophenol derivatives, initial encounter: Secondary | ICD-10-CM | POA: Insufficient documentation

## 2016-10-19 DIAGNOSIS — N809 Endometriosis, unspecified: Secondary | ICD-10-CM | POA: Insufficient documentation

## 2016-10-19 DIAGNOSIS — K5903 Drug induced constipation: Secondary | ICD-10-CM | POA: Diagnosis not present

## 2016-10-19 DIAGNOSIS — R1030 Lower abdominal pain, unspecified: Secondary | ICD-10-CM

## 2016-10-19 DIAGNOSIS — Z87891 Personal history of nicotine dependence: Secondary | ICD-10-CM | POA: Diagnosis not present

## 2016-10-19 LAB — COMPREHENSIVE METABOLIC PANEL
ALBUMIN: 4.4 g/dL (ref 3.5–5.0)
ALK PHOS: 38 U/L (ref 38–126)
ALT: 23 U/L (ref 14–54)
ANION GAP: 8 (ref 5–15)
AST: 19 U/L (ref 15–41)
BUN: 26 mg/dL — ABNORMAL HIGH (ref 6–20)
CO2: 26 mmol/L (ref 22–32)
Calcium: 9.3 mg/dL (ref 8.9–10.3)
Chloride: 102 mmol/L (ref 101–111)
Creatinine, Ser: 0.75 mg/dL (ref 0.44–1.00)
GFR calc Af Amer: >60 mL/min (ref >60)
GFR calc non Af Amer: >60 mL/min (ref >60)
GLUCOSE: 90 mg/dL (ref 65–99)
Potassium: 4.1 mmol/L (ref 3.5–5.1)
Sodium: 136 mmol/L (ref 135–145)
Total Bilirubin: 0.4 mg/dL (ref 0.3–1.2)
Total Protein: 7.1 g/dL (ref 6.5–8.1)

## 2016-10-19 LAB — CBC
HCT: 40.1 % (ref 35.0–47.0)
HEMOGLOBIN: 13.9 g/dL (ref 12.0–16.0)
MCH: 29.3 pg (ref 26.0–34.0)
MCHC: 34.7 g/dL (ref 32.0–36.0)
MCV: 84.5 fL (ref 80.0–100.0)
Platelets: 232 10*3/uL (ref 150–440)
RBC: 4.74 MIL/uL (ref 3.80–5.20)
RDW: 12.1 % (ref 11.5–14.5)
WBC: 5.9 10*3/uL (ref 3.6–11.0)

## 2016-10-19 LAB — URINALYSIS, COMPLETE (UACMP) WITH MICROSCOPIC
BILIRUBIN URINE: NEGATIVE
GLUCOSE, UA: NEGATIVE mg/dL
HGB URINE DIPSTICK: NEGATIVE
Ketones, ur: NEGATIVE mg/dL
Leukocytes, UA: NEGATIVE
NITRITE: NEGATIVE
PH: 6 (ref 5.0–8.0)
Protein, ur: NEGATIVE mg/dL
SPECIFIC GRAVITY, URINE: 1.023 (ref 1.005–1.030)

## 2016-10-19 LAB — POCT PREGNANCY, URINE: Preg Test, Ur: NEGATIVE

## 2016-10-19 MED ORDER — MAGNESIUM CITRATE PO SOLN
ORAL | Status: AC
  Administered 2016-10-19: 0.5 via ORAL
  Filled 2016-10-19: qty 296

## 2016-10-19 MED ORDER — MAGNESIUM CITRATE PO SOLN
0.5000 | Freq: Once | ORAL | Status: AC
  Administered 2016-10-19: 0.5 via ORAL
  Filled 2016-10-19: qty 296

## 2016-10-19 MED ORDER — SENNA 8.6 MG PO TABS: 2.0000 | ORAL_TABLET | Freq: Two times a day (BID) | ORAL | 0 refills | Status: DC

## 2016-10-19 MED ORDER — DOCUSATE SODIUM 100 MG PO CAPS: 200.0000 mg | ORAL_CAPSULE | Freq: Two times a day (BID) | ORAL | 0 refills | Status: DC

## 2016-10-19 NOTE — ED Triage Notes (Signed)
Pt presents to ED c/o of low abdominal pain that is sharp . Pt with recent dx of endometriosis and thinks the pain is related to this. Denies n/v/d

## 2016-10-19 NOTE — Discharge Instructions (Signed)
Take one half bottle of the magnesium citrate and wait 30 minutes. If you have not had a bowel movement, take the other half bottle. Follow up with your gynecologist tomorrow. Continue taking a stool softener such as Colace while taking opioid pain medicine like Percocet.

## 2016-10-19 NOTE — ED Notes (Signed)

## 2016-10-19 NOTE — ED Provider Notes (Signed)
[facility] Emergency Department Provider Note  ____________________________________________  Time seen: Approximately 8:02 PM  I have reviewed the triage vital signs and the nursing notes.   HISTORY  Chief Complaint Abdominal Pain and Endometriosis    HPI Grace Norris is a 26 y.o. female who complains of chronic lower abdominal pain that is nonradiating, normally intermittent but now more constant waxing and waning. Worse when bending at the waist. No alleviating factors. Has a history of endometriosis surgically diagnosed about 6 months ago. Has an appointment to follow-up with her gynecologist tomorrow. Was seen in the ED 3 days ago due to the pain and prescribed a short course of Percocet. She states she still has some of the Percocet but since starting it she is now constipated as well. This has made her abdominal pain feel worse. Denies dysuria frequency urgency fever chills dizziness chest pain shortness of breath. No vomiting.   Past Medical History:  Diagnosis Date  . Anemia   . Asthma    WELL CONTROLLED  . Chronic kidney disease    H/O STONES  . 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 Incidental finding noted on CT scan  . Endometriosis 03/19/2016  . Epilepsy ([place])   . Migraines   . Seizures ([place]) 2016   LAST SEIZURE 1-1.5 YRS AGO  . Sleep apnea    DOES NOT USE CPAP     Patient Active Problem List   Diagnosis Date Noted  . Endometriosis 03/19/2016  . Family history of cancer 10/12/2015  . Pelvic pain in female 10/12/2015     Past Surgical History:  Procedure Laterality Date  . HAND TENDON SURGERY Bilateral 11/2015  . LAPAROSCOPY N/A 03/09/2016   Procedure: LAPAROSCOPY DIAGNOSTIC and D AND C;  Surgeon: Rubie Maid, MD;  Location: [facility] ORS;  Service: Gynecology;  Laterality: N/A;     Prior to Admission medications   Medication Sig Start Date End Date Taking? Authorizing Provider  acetaminophen (TYLENOL 8  HOUR) 650 MG CR tablet Take 1 tablet (650 mg total) by mouth every 8 (eight) hours as needed for pain (mild). 03/09/16   Rubie Maid, MD  albuterol-ipratropium (COMBIVENT) 18-103 MCG/ACT inhaler Inhale 2 puffs into the lungs every 4 (four) hours as needed for wheezing or shortness of breath.    [provider]  baclofen (LIORESAL) 10 MG tablet TAKE 1/2 TO 1 TABLET BY MOUTH EVERY 8 HOURS AS NEEDED FOR MUSCLE SPASM 06/20/16   [provider]  benzonatate (TESSALON PERLES) 100 MG capsule Take 1-2 tabs TID prn cough 09/05/16   Menshew, Dannielle Karvonen, PA-C  docusate sodium (COLACE) 100 MG capsule Take 2 capsules (200 mg total) by mouth 2 (two) times daily. 10/19/16   Carrie Mew, MD  HYDROcodone-acetaminophen (NORCO/VICODIN) 5-325 MG tablet Take 1 tablet by mouth every 6 (six) hours as needed. for pain 08/17/16   [provider]  levETIRAcetam (KEPPRA) 750 MG tablet 1 tablet twice daily 08/04/16   [provider]  medroxyPROGESTERone (DEPO-PROVERA) 150 MG/ML injection Inject 1 mL (150 mg total) into the muscle every 3 (three) months. 09/01/16   Rubie Maid, MD  Multiple Vitamin (MULTI-VITAMINS) TABS Take by mouth.    [provider]  nortriptyline (PAMELOR) 10 MG capsule Take by mouth. 06/02/16 06/02/17  [provider]  oxyCODONE-acetaminophen (PERCOCET) 5-325 MG tablet Take 1-2 tablets by mouth every 6 (six) hours as needed. 10/16/16   Earleen Newport, MD  pantoprazole (PROTONIX) 40 MG tablet Take 1  tablet (40 mg total) by mouth daily. 09/05/16 09/05/17  Menshew, Dannielle Karvonen, PA-C  prednisoLONE acetate (PRED FORTE) 1 % ophthalmic suspension 1 DROP BOTH EYES 4X/DAY X 7 DAYS, 3X/DAY X 7 DAYS, 2X/DAY X 7 DAYS, 1X/DAY X 7 DAYS 06/25/16   [provider]  rizatriptan (MAXALT-MLT) 10 MG disintegrating tablet TAKE 1 TABLET BY MOUTH ONCE FOR MIGRAINE AND MAY TAKE SECOND DOSE AFTER 2 HOURS AS NEEDED 06/19/16   [provider]  senna (SENOKOT)  8.6 MG TABS tablet Take 2 tablets (17.2 mg total) by mouth 2 (two) times daily. 10/19/16   Carrie Mew, MD  traMADol (ULTRAM) 50 MG tablet Take 1 tablet (50 mg total) by mouth every 6 (six) hours as needed. 08/28/16 08/28/17  Lavonia Drafts, MD  XIIDRA 5 % SOLN INSTILL 1 DROP INTO BOTH EYES TWICE A DAY 07/02/16   [provider]     Allergies Aspartame; Prednisone; Ibuprofen; Latex; and Penicillins   Family History  Problem Relation Age of Onset  . Scoliosis Brother   . Fibromyalgia Mother   . Cancer Mother 103       ovarian; s/p partial hysterectomy with RSO  . Graves' disease Mother   . Alzheimer's disease Other   . Cancer Maternal Grandmother 30       breast cancer (unilateral lumpectomy)  . Breast cancer Maternal Grandmother     Social History Social History  Substance Use Topics  . Smoking status: Former Smoker    Packs/day: 0.50    Years: 1.50    Types: Cigarettes    Quit date: 03/05/2011  . Smokeless tobacco: Never Used  . Alcohol use Yes     Comment: occ    Review of Systems  Constitutional:   No fever or chills.  ENT:   No sore throat. No rhinorrhea. Cardiovascular:   No chest pain or syncope. Respiratory:   No dyspnea or cough. Gastrointestinal:   Chronic lower abdominal pain as above without vomiting or diarrhea. Positive constipation Musculoskeletal:   Negative for focal pain or swelling All other systems reviewed and are negative except as documented above in ROS and HPI.  ____________________________________________   PHYSICAL EXAM:  VITAL SIGNS: ED Triage Vitals  Enc Vitals Group     BP 10/19/16 1817 122/74     Pulse Rate 10/19/16 1817 81     Resp 10/19/16 1817 19     Temp 10/19/16 1817 98.7 F (37.1 C)     Temp Source 10/19/16 1817 Oral     SpO2 10/19/16 1817 100 %     Weight 10/19/16 1817 132 lb 8 oz (60.1 kg)     Height 10/19/16 1817 5\' 4"  (1.626 m)     Head Circumference --      Peak Flow --      Pain Score 10/19/16 1816 9      Pain Loc --      Pain Edu? --      Excl. in [place]? --     Vital signs reviewed, nursing assessments reviewed.   Constitutional:   Alert and oriented. Well appearing and in no distress. Eyes:   No scleral icterus. No conjunctival pallor. PERRL. EOMI.  No nystagmus. ENT   Head:   Normocephalic and atraumatic.   Nose:   No congestion/rhinnorhea.    Mouth/Throat:   MMM, no pharyngeal erythema. No peritonsillar mass.    Neck:   No meningismus. Full ROM Hematological/Lymphatic/Immunilogical:   No cervical lymphadenopathy. Cardiovascular:   RRR. Symmetric  bilateral radial and DP pulses.  No murmurs.  Respiratory:   Normal respiratory effort without tachypnea/retractions. Breath sounds are clear and equal bilaterally. No wheezes/rales/rhonchi. Gastrointestinal:   Soft Without localizing tenderness. Non distended. There is no CVA tenderness.  No rebound, rigidity, or guarding. Genitourinary:   deferred Musculoskeletal:   Normal range of motion in all extremities. No joint effusions.  No lower extremity tenderness.  No edema. Neurologic:   Normal speech and language.  CN 2-10 normal. Motor grossly intact. No gross focal neurologic deficits are appreciated.  Skin:    Skin is warm, dry and intact. No rash noted.  No petechiae, purpura, or bullae.  ____________________________________________    LABS (pertinent positives/negatives) (all labs ordered are listed, but only abnormal results are displayed) Labs Reviewed  COMPREHENSIVE METABOLIC PANEL - Abnormal; Notable for the following:       Result Value   BUN 26 (*)    All other components within normal limits  URINALYSIS, COMPLETE (UACMP) WITH MICROSCOPIC - Abnormal; Notable for the following:    Color, Urine YELLOW (*)    APPearance HAZY (*)    Bacteria, UA RARE (*)    Squamous Epithelial / LPF 0-5 (*)    All other components within normal limits  CBC  POC URINE PREG, ED  POCT PREGNANCY, URINE    ____________________________________________   EKG    ____________________________________________    RADIOLOGY  No results found.  ____________________________________________   PROCEDURES Procedures  ____________________________________________   INITIAL IMPRESSION / ASSESSMENT AND PLAN / ED COURSE  Pertinent labs & imaging results that were available during my care of the patient were reviewed by me and considered in my medical decision making (see chart for details).  Patient presents with chronic abdominal pain and constipation. Vital signs are normal. She is very well-appearing no acute distress energetic and in good spirits.Considering the patient's symptoms, medical history, and physical examination today, I have low suspicion for cholecystitis or biliary pathology, pancreatitis, perforation or bowel obstruction, hernia, intra-abdominal abscess, AAA or dissection, volvulus or intussusception, mesenteric ischemia, or appendicitis.  Likely opioid-induced constipation worsening her chronic symptoms. We'll give magnesium citrate for her symptoms tonight. Also suggested continuing a stool softener as long as she is on opioid pain medicine to prevent recurrent constipation. I stressed the importance of keeping her appointment with gynecology tomorrow due to her endometriosis.     ____________________________________________   FINAL CLINICAL IMPRESSION(S) / ED DIAGNOSES  Final diagnoses:  Lower abdominal pain  Endometriosis  Drug-induced constipation      New Prescriptions   DOCUSATE SODIUM (COLACE) 100 MG CAPSULE    Take 2 capsules (200 mg total) by mouth 2 (two) times daily.   SENNA (SENOKOT) 8.6 MG TABS TABLET    Take 2 tablets (17.2 mg total) by mouth 2 (two) times daily.     Portions of this note were generated with dragon dictation software. Dictation errors may occur despite best attempts at proofreading.    Carrie Mew, MD 10/19/16 2004

## 2016-10-20 ENCOUNTER — Encounter: Payer: Self-pay | Admitting: Obstetrics and Gynecology

## 2016-10-20 ENCOUNTER — Ambulatory Visit (INDEPENDENT_AMBULATORY_CARE_PROVIDER_SITE_OTHER): Payer: Medicare Other | Admitting: Obstetrics and Gynecology

## 2016-10-20 VITALS — BP 104/69 | HR 97 | Ht 64.0 in | Wt 133.5 lb

## 2016-10-20 DIAGNOSIS — N809 Endometriosis, unspecified: Secondary | ICD-10-CM

## 2016-10-20 DIAGNOSIS — R102 Pelvic and perineal pain: Secondary | ICD-10-CM | POA: Diagnosis not present

## 2016-10-20 MED ORDER — KETOROLAC TROMETHAMINE 30 MG/ML IJ SOLN
30.0000 mg | Freq: Once | INTRAMUSCULAR | Status: AC
  Administered 2016-10-20: 30 mg via INTRAMUSCULAR

## 2016-10-20 MED ORDER — KETOROLAC TROMETHAMINE 30 MG/ML IJ SOLN: 30.0000 mg | Freq: Once | INTRAMUSCULAR | Status: DC

## 2016-10-20 MED ORDER — KETOROLAC TROMETHAMINE 10 MG PO TABS: 10.0000 mg | ORAL_TABLET | Freq: Four times a day (QID) | ORAL | 0 refills | Status: DC | PRN

## 2016-10-20 NOTE — Progress Notes (Signed)
GYNECOLOGY PROGRESS NOTE  Subjective:    Patient ID: Grace Norris, female    DOB: [birth year], 26 y.o.   MRN: [id number]  HPI  Patient is a 26 y.o. [id number] female with a history of endometriosis who presents for f/u after ER visit.  Patient notes that she was seen in the ER on 10/16/2016 for complaints of pelvic pain (mostly left lower quadrant) that was not controlled with her current pain medications. Patient states that her IV pain medication and a short supply of Percocet. Patient then returned to the ER on 10/19/2016 complaining of worsening lower abdominal pain due to superimposed constipation.  She was treated with magnesium citrate and was told to continue with a stool softener.  Reports bowel movements are better now.   Of note patient also states that she was seen by her neurologist earlier this month, who notes that he believes that her hormonal treatments for her endometriosis may be interfering with her seizure medications, as patient has been noted to be shaking during the middle of the night with the thought being that these may also E seizure-like activities. Patient is currently receiving Depo-Provera for control of her endometriosis symptoms after having IUD removed last month. Last injection was 09/03/2016. Patient notes that she does not feel that the Depo-Provera has really helped to control her symptoms, however this was only her first dose.  Patient wonders if she can have another surgery to remove the endometriosis or even a hysterectomy.  The following portions of the patient's history were reviewed and updated as appropriate: allergies, current medications, past family history, past medical history, past social history, past surgical history and problem list.  Review of Systems Pertinent items noted in HPI and remainder of comprehensive ROS otherwise negative.   Objective:   Blood pressure 104/69, pulse 97, height 5\' 4"  (1.626 m), weight 133 lb 8 oz (60.6  kg). General appearance: alert and no distress Abdomen: normal findings: bowel sounds normal, no masses palpable, no organomegaly and soft and abnormal findings:  mild tenderness in the lower abdomen Pelvic: external genitalia normal, rectovaginal septum normal.  Vagina without discharge.  Cervix normal appearing, no lesions and no motion tenderness.  Uterus mobile, nontender, normal shape and size.  Adnexae non-palpable, nontender bilaterally.  Extremities: extremities normal, atraumatic, no cyanosis or edema Neurologic: Alert and oriented X 3, normal strength and tone. Normal symmetric reflexes. Normal coordination and gait    Labs:  Results for DUAA, STELZNER (MRN [id number]) as of 10/21/2016 08:50  Ref. Range 08/28/2016 16:16  Specimen source GC/Chlam Unknown ENDOCERVICAL  Chlamydia Tr Latest Ref Range: NOT DETECTED  NOT DETECTED  N gonorrhoeae Latest Ref Range: NOT DETECTED  NOT DETECTED  Yeast Wet Prep HPF POC Latest Ref Range: NONE SEEN  NONE SEEN  Trich, Wet Prep Latest Ref Range: NONE SEEN  NONE SEEN  Clue Cells Wet Prep HPF POC Latest Ref Range: NONE SEEN  NONE SEEN  WBC, Wet Prep HPF POC Latest Ref Range: NONE SEEN  MODERATE (A)    Imaging (08/28/2016 Pelvic Ultrasound):  EXAM: TRANSABDOMINAL AND TRANSVAGINAL ULTRASOUND OF PELVIS  TECHNIQUE: Both transabdominal and transvaginal ultrasound examinations of the pelvis were performed. Transabdominal technique was performed for global imaging of the pelvis including uterus, ovaries, adnexal regions, and pelvic cul-de-sac. It was necessary to proceed with endovaginal exam following the transabdominal exam to visualize the uterus and ovaries.  COMPARISON:  None  FINDINGS: Uterus  Measurements: 5.7 x 3.7 x 4.6 cm. No  fibroids or other mass visualized. Uterus was retroverted.  Endometrium  Thickness: 1.3 mm. No focal abnormality visualized. IUD in place, in satisfactory positioning within the endometrial  canal.  Right ovary  Measurements: 1.9 x 1.4 x 1.5 cm. Normal appearance/no adnexal mass.  Left ovary  Measurements: 2.2 x 2.8 x 2.0 cm. Normal appearance/no adnexal mass.  Other findings  No abnormal free fluid.  IMPRESSION: 1. Negative pelvic ultrasound.  No acute abnormality identified. 2. IUD in appropriate position within the endometrial canal.  Assessment:   Pelvic pain in female Endometriosis  Plan:   Lengthy discussion had again with patient regarding treatment of endometriosis and pelvic pain. Patient usually takes tramadol for her pain, with the occasional need for stronger pain narcotics. Has pain medication scribe from the ER. Will treat today with IM Toradol 30 mg olive by Toradol by mouth 10 mg for 5 days to help reduce inflammation and pain associated with endometriosis. Patient last had surgery for endometriosis on October 2017, discussed that it has not been a year since her diagnostic laparoscopy. Would not recommend surgery again at this time as extensive pelvic disease was not noted, and patient has not tried hormonal interventions for very long. Due to the patient's neurologic condition of seizures, and neural neurologist recommendation for her to not use hormonal birth control methods, discussion was also had on use of Lupron or danazol for help to control her endometriosis symptoms. In addition I also discussed with her alternative methods such as acupuncture or pelvic floor physical therapy. Patient notes that she would be willing to try physical therapy, so we will refer her. Given handout on Lupron to consider. Her next dose of Depo-Provera would be due in July, however this will give patient enough time to think about other options as she will not be able to receive her next dose.   A total of 25 minutes were spent face-to-face with the patient during the encounter with greater than 50% dealing with counseling and coordination of care.   Rubie Maid, MD [facility]

## 2016-10-23 ENCOUNTER — Encounter: Payer: Self-pay | Admitting: Obstetrics and Gynecology

## 2016-11-05 ENCOUNTER — Ambulatory Visit: Payer: Medicare Other | Attending: Obstetrics and Gynecology | Admitting: Physical Therapy

## 2016-11-05 ENCOUNTER — Encounter: Payer: Self-pay | Admitting: Physical Therapy

## 2016-11-05 DIAGNOSIS — M4125 Other idiopathic scoliosis, thoracolumbar region: Secondary | ICD-10-CM | POA: Insufficient documentation

## 2016-11-05 DIAGNOSIS — M4122 Other idiopathic scoliosis, cervical region: Secondary | ICD-10-CM | POA: Diagnosis present

## 2016-11-05 DIAGNOSIS — R531 Weakness: Secondary | ICD-10-CM | POA: Diagnosis not present

## 2016-11-05 DIAGNOSIS — R278 Other lack of coordination: Secondary | ICD-10-CM | POA: Insufficient documentation

## 2016-11-05 NOTE — Patient Instructions (Addendum)
Ways to decrease strain on pelvic floor:   toileting posture with foot on blocks/ foot stool Breathing instead of straining   Log roll out of bed      Exhale as you exert (weight lift)    +++  Prone Heel Press for strengthening sacro-iliac joints  1. Lie on your belly. If you have an arch in your low back or it feels umcomfortable, place a pillow under your low belly/hips to make sure your low back feel comfortable.   2. Place our forehead on top of your palms.      Widen your knees apart for starting position.   3. Inhale, feel belly and low back expand  4. Exhale, feel belly hug in, press heel together and count aloud for 5 sec. Then relax the heel squeezing.  Perform 10 reps of 5 sec holds. 2 sets/ day.    If you feel entire buttock tighten too much or feel low back pain, apply 50% less effort. As you press your heel together, you will feel as if your pubic bone (front of your pelvis) and sacrum (back of your pelvis) gentle move towards each other or your low abdominal muscles hug in more.     '    And    Breathing with your current relaxation    "smelling soup"   1-2 pause inhale  ribcage expansion, less chest rising 2-1 pause exhale, pelvic gently lift, belly hugs    ____________

## 2016-11-06 NOTE — Therapy (Addendum)
[place] MAIN [facility] SERVICES [address] [place], [place], [address] Phone: [phone]   Fax:  [phone]  Physical Therapy Evaluation  Patient Details  Name: Grace Norris MRN: [id number] Date of Birth: 10-05-1990 Referring Provider: Dr. Marcelline Mates   Encounter Date: 11/05/2016    Past Medical History:  Diagnosis Date  . Anemia   . Asthma    WELL CONTROLLED  . Chronic kidney disease    H/O STONES  . 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 Incidental finding noted on CT scan  . Endometriosis 03/19/2016  . Epilepsy ([place])   . Migraines   . Seizures ([place]) 2016   LAST SEIZURE 1-1.5 YRS AGO  . Sleep apnea    DOES NOT USE CPAP    Past Surgical History:  Procedure Laterality Date  . HAND TENDON SURGERY Bilateral 11/2015  . LAPAROSCOPY N/A 03/09/2016   Procedure: LAPAROSCOPY DIAGNOSTIC and D AND C;  Surgeon: Rubie Maid, MD;  Location: [facility] ORS;  Service: Gynecology;  Laterality: N/A;    There were no vitals filed for this visit.       Subjective Assessment - 11/09/16 1250    Subjective 1) Pt had experienced stabbing pain 9/10-10/10 around menstrual cycle, 5-8/10 on other days.  Pt has abnormal cycles and she sees spotty blood today and feels bloating, in addition to the stabbing pain. Pain is located at low abdominal spanning suprapubic area. Pt has had this pain since pt was 26 years old but was not Dx until her recent gynecologist MD performed biopsy to confirm endometriosis in Oct 2017. Denied other gynecological issues. Pain interferes with walking after 5 mins, sleep , sexual intercourse.   2) neck and low back pain 2/2 scoliosis. Pt has not undergone PT for these issues. Pt uses tru-stim machine prescribed by her MD to relief mm spasms 100%.  8/10 LBP with pins and needles today located in L5/S area, denied radiating pain. Pt goes to the gym 2x/ week and enjoys boxing, yoga, swimming. Enjoys tennis with her  fiance. Weight lifts 5-10 lbs, bench press, chest press, leg press. Enjoys meditation, listen to bible verses, positive affirmation for relaxation  3) constipation: once every 2 -3 days , [place] stool stype 6 mostly, type 4.  Strains with bowel movements    4) no sex drive 2/2 pelvic pain it has been 8 months since her and her fiance had intercourse     Pertinent History epilepsy. Daily activities: not working. Working with her neurologist to help her sleep schedule. Average hours of sleep 2-3 hours .      Patient Stated Goals get the pain at [place], learn as much as she can about endometriosis.              [facility] PT Assessment - 11/09/16 1303      Assessment   Medical Diagnosis pelvic pain   Referring Provider Dr. Marcelline Mates      Precautions   Precautions None     Restrictions   Weight Bearing Restrictions No     Balance Screen   Has the patient fallen in the past 6 months No     Coordination   Gross Motor Movements are Fluid and Coordinated --  chest breathing, ribcage elevation   Fine Motor Movements are Fluid and Coordinated --  abdom strain w/ cue for BM      Posture/Postural Control   Posture Comments perturbation with SLR      Palpation  Spinal mobility scoliosis, convex at L cervical, R thoracolumbar T12-L2.  no pelvic obliquities.    SI assessment  pain w/ palpation at L convex T12-L2 (also reported tingling),  PSIS B    Palpation comment tenderness at suprapubic area , noted tightness      Bed Mobility   Bed Mobility --  crunch method             Objective measurements completed on examination: See above findings.                  PT Education - 11/05/16 1433    Education provided Yes   Education Details POC, anatomy, physiology, goals, HEP             PT Long Term Goals - 11/09/16 1252      PT LONG TERM GOAL #1   Title Pt will decrease her  [place] score from  % to <  % in order to improve ADLs, and QOL   Time 12   Period Weeks    Status New     PT LONG TERM GOAL #2   Title Pt will decrease her abdominal separation from 2 fingers width to <1 fingers width in order to improve intraabdominal pressure to less sacroiliac  pain to put forces through the spine when walking    Time 12   Period Weeks   Status New     PT LONG TERM GOAL #3   Title Pt will demo no tenderness/ tensions at suprapubic area in order play tennis with fiance   Time 12   Period Weeks   Status New     PT LONG TERM GOAL #4   Title Pt will be IND with HEP customized for her scoliosis in order to return to ftiness exercise with minimize risk for pain    Time 12   Period Weeks   Status New                Plan - 11/09/16 1306    Clinical Impression Statement Pt is a 26 yo female who reports pain over her suprapubic area, pelvic area 2/2 endometriosis in addition to neck and LBP 2/2 to scoliosis and constipation. These Sx impact walking after 5 mins, sleep , and sexual intercourse. Pt's clinical presentations include a L cervical/ L thoracolumbar scoliosis, poor force closure at pelvic girdle, upper abdominal separation, dyscoordination of pelvic floor and diaphragm, poor body mechanics with toileting and weight lifting.       Rehab Potential Good   PT Frequency 1x / week   PT Duration 12 weeks   PT Treatment/Interventions ADLs/Self Care Home Management;Aquatic Therapy;Therapeutic exercise;Therapeutic activities;Functional mobility training;Stair training;Balance training;Patient/family education;Manual techniques;Manual lymph drainage;Taping;Passive range of motion;Neuromuscular re-education;Moist Heat;Energy conservation;Scar mobilization   Consulted and Agree with Plan of Care Patient      Patient will benefit from skilled therapeutic intervention in order to improve the following deficits and impairments:  Decreased strength, Decreased range of motion, Decreased activity tolerance, Decreased endurance, Decreased balance, Decreased safety  awareness, Impaired flexibility, Decreased mobility, Decreased coordination, Decreased cognition, Improper body mechanics, Pain, Impaired sensation, Postural dysfunction, Increased muscle spasms, Hypomobility  Visit Diagnosis: Weakness  Other lack of coordination  Other idiopathic scoliosis, cervical region  Other idiopathic scoliosis, thoracolumbar region     Problem List Patient Active Problem List   Diagnosis Date Noted  . Endometriosis 03/19/2016  . Family history of cancer 10/12/2015  . Pelvic pain in female 10/12/2015    Talmadge Chad  Sheran Fava ,PT, DPT, E-RYT  11/09/2016, 1:06 PM  [place] MAIN [facility] SERVICES [address] [place], [place], [address] Phone: [phone]   Fax:  [phone]  Name: SHALIKA ARNTZ MRN: [id number] Date of Birth: 12-30-1990

## 2016-11-20 ENCOUNTER — Ambulatory Visit: Payer: Medicare Other

## 2016-11-20 ENCOUNTER — Telehealth: Payer: Self-pay | Admitting: Obstetrics and Gynecology

## 2016-11-20 ENCOUNTER — Encounter: Payer: Self-pay | Admitting: Obstetrics and Gynecology

## 2016-11-20 NOTE — Telephone Encounter (Signed)
°  Patient called wanting to speak with "Charlott Rakes", Patient did not disclose any other information. "Please advise.

## 2016-11-20 NOTE — Telephone Encounter (Signed)
Called pt she states that her Neurologist says "absoluelty no to Lupron" and pt states that she is unable to tolerate pelvic floor therapy due to pain. Please advise.

## 2016-11-22 NOTE — Telephone Encounter (Signed)
Well currently her only other option would be Danazol, 400 mg BID, to take for 6 months.  Otherwise, I would have to speak to her Neurologist as he has vetoed all other management options for her endometriosis. I do believe I also referred her to physical therapy, has she heard back from them yet as far as an appointment being scheduled?  Dr. Marcelline Mates

## 2016-12-09 ENCOUNTER — Encounter: Payer: Self-pay | Admitting: Intensive Care

## 2016-12-09 ENCOUNTER — Emergency Department: Payer: Medicare Other

## 2016-12-09 ENCOUNTER — Emergency Department
Admission: EM | Admit: 2016-12-09 | Discharge: 2016-12-09 | Disposition: A | Discharge status: Home / Self Care | Payer: Medicare Other | Attending: Emergency Medicine | Admitting: Emergency Medicine

## 2016-12-09 DIAGNOSIS — J069 Acute upper respiratory infection, unspecified: Secondary | ICD-10-CM

## 2016-12-09 DIAGNOSIS — B9789 Other viral agents as the cause of diseases classified elsewhere: Secondary | ICD-10-CM | POA: Diagnosis not present

## 2016-12-09 DIAGNOSIS — Z87891 Personal history of nicotine dependence: Secondary | ICD-10-CM | POA: Insufficient documentation

## 2016-12-09 DIAGNOSIS — Z7951 Long term (current) use of inhaled steroids: Secondary | ICD-10-CM | POA: Diagnosis not present

## 2016-12-09 DIAGNOSIS — R509 Fever, unspecified: Secondary | ICD-10-CM | POA: Diagnosis present

## 2016-12-09 DIAGNOSIS — N189 Chronic kidney disease, unspecified: Secondary | ICD-10-CM | POA: Insufficient documentation

## 2016-12-09 DIAGNOSIS — R05 Cough: Secondary | ICD-10-CM | POA: Insufficient documentation

## 2016-12-09 DIAGNOSIS — Z79899 Other long term (current) drug therapy: Secondary | ICD-10-CM | POA: Insufficient documentation

## 2016-12-09 DIAGNOSIS — J45909 Unspecified asthma, uncomplicated: Secondary | ICD-10-CM | POA: Diagnosis not present

## 2016-12-09 MED ORDER — BENZONATATE 100 MG PO CAPS: 100.0000 mg | ORAL_CAPSULE | Freq: Three times a day (TID) | ORAL | 0 refills | Status: DC | PRN

## 2016-12-09 NOTE — Discharge Instructions (Signed)
Follow-up with [facility] clinic or your primary care doctor if any continued problems. Increase fluids. Tylenol if needed for fever or body aches. Tessalon one every 8 hours if needed for cough.

## 2016-12-09 NOTE — ED Provider Notes (Signed)
[facility] Emergency Department Provider Note   ____________________________________________   First MD Initiated Contact with Patient 12/09/16 1547     (approximate)  I have reviewed the triage vital signs and the nursing notes.   HISTORY  Chief Complaint URI    HPI Grace Norris is a 26 y.o. female comes in today because she "just doesn't feel well". Patient complains of runny nose, watery eyes, fever and chills. She does have a slightly productive cough. She is not taking any over-the-counter medication. She denies any other symptoms and denies urinary symptoms. Patient states that she called her neurologist to suggested she come to the emergency room. Patient rates her pain as a 7/10.   Past Medical History:  Diagnosis Date  . Anemia   . Asthma    WELL CONTROLLED  . Chronic kidney disease    H/O STONES  . 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 Incidental finding noted on CT scan  . Endometriosis 03/19/2016  . Epilepsy ([place])   . Migraines   . Seizures ([place]) 2016   LAST SEIZURE 1-1.5 YRS AGO  . Sleep apnea    DOES NOT USE CPAP    Patient Active Problem List   Diagnosis Date Noted  . Endometriosis 03/19/2016  . Family history of cancer 10/12/2015  . Pelvic pain in female 10/12/2015    Past Surgical History:  Procedure Laterality Date  . HAND TENDON SURGERY Bilateral 11/2015  . LAPAROSCOPY N/A 03/09/2016   Procedure: LAPAROSCOPY DIAGNOSTIC and D AND C;  Surgeon: Rubie Maid, MD;  Location: [facility] ORS;  Service: Gynecology;  Laterality: N/A;    Prior to Admission medications   Medication Sig Start Date End Date Taking? Authorizing Provider  acetaminophen (TYLENOL 8 HOUR) 650 MG CR tablet Take 1 tablet (650 mg total) by mouth every 8 (eight) hours as needed for pain (mild). 03/09/16   Rubie Maid, MD  albuterol (PROAIR HFA) 108 (90 Base) MCG/ACT inhaler ProAir HFA 90 mcg/actuation aerosol inhaler  INHALE 1  PUFF ORALLY EVERY 6 HOURS AS NEEDED FOR FOR SHORTNESS OF BREATH Ellie Lunch    [provider]  albuterol-ipratropium (COMBIVENT) 18-103 MCG/ACT inhaler Inhale 2 puffs into the lungs every 4 (four) hours as needed for wheezing or shortness of breath.    [provider]  azelastine (OPTIVAR) 0.05 % ophthalmic solution azelastine 0.05 % eye drops  INSTILL 1 DROP INTO BOTH EYES TWICE A DAY    [provider]  baclofen (LIORESAL) 10 MG tablet TAKE 1/2 TO 1 TABLET BY MOUTH EVERY 8 HOURS AS NEEDED FOR MUSCLE SPASM 06/20/16   [provider]  benzonatate (TESSALON PERLES) 100 MG capsule Take 1 capsule (100 mg total) by mouth 3 (three) times daily as needed. 12/09/16 12/09/17  Johnn Hai, PA-C  busPIRone (BUSPAR) 15 MG tablet buspirone 15 mg tablet    [provider]  butalbital-acetaminophen-caffeine (FIORICET, ESGIC) 50-325-40 MG tablet butalbital-acetaminophen-caffeine 50 mg-325 mg-40 mg tablet  TAKE 1 CAPSULE BY MOUTH EVERY 4 HOURS AS NEEDED FOR UP TO 3 DAYS    [provider]  diclofenac (CATAFLAM) 50 MG tablet TAKE 1 TABLET (50 MG TOTAL) BY MOUTH 2 (TWO) TIMES DAILY AS NEEDED. 10/05/16   [provider]  diclofenac (VOLTAREN) 75 MG EC tablet diclofenac sodium 75 mg tablet,delayed release    [provider]  docusate sodium (COLACE) 100 MG capsule Take 2 capsules (200 mg total) by mouth 2 (two) times daily. 10/19/16   Joni Fears,  Doren Custard, MD  eletriptan (RELPAX) 40 MG tablet Relpax 40 mg tablet    [provider]  folic acid (FOLVITE) 1 MG tablet folic acid 1 mg tablet  TAKE 4 TABLETS BY MOUTH EVERY DAY    [provider]  HYDROcodone-acetaminophen (NORCO/VICODIN) 5-325 MG tablet Take 1 tablet by mouth every 6 (six) hours as needed. for pain 08/17/16   [provider]  ketorolac (TORADOL) 10 MG tablet Take 1 tablet (10 mg total) by mouth every 6 (six) hours as needed. 10/20/16   Rubie Maid, MD    levETIRAcetam (KEPPRA) 750 MG tablet levetiracetam 750 mg tablet    [provider]  loteprednol (LOTEMAX) 0.2 % SUSP Apply to eye. 10/06/16   [provider]  Multiple Vitamin (MULTI-VITAMINS) TABS Take by mouth.    [provider]  norethindrone (AYGESTIN) 5 MG tablet norethindrone acetate 5 mg tablet    [provider]  nortriptyline (PAMELOR) 10 MG capsule Take by mouth. 06/02/16 06/02/17  [provider]  Olopatadine HCl (PATADAY) 0.2 % SOLN Pataday 0.2 % eye drops    [provider]  ondansetron (ZOFRAN-ODT) 4 MG disintegrating tablet ondansetron 4 mg disintegrating tablet    [provider]  oxyCODONE-acetaminophen (PERCOCET) 5-325 MG tablet Take 1-2 tablets by mouth every 6 (six) hours as needed. 10/16/16   Earleen Newport, MD  pantoprazole (PROTONIX) 40 MG tablet Take 1 tablet (40 mg total) by mouth daily. 09/05/16 09/05/17  Menshew, Dannielle Karvonen, PA-C  prednisoLONE acetate (PRED FORTE) 1 % ophthalmic suspension 1 DROP BOTH EYES 4X/DAY X 7 DAYS, 3X/DAY X 7 DAYS, 2X/DAY X 7 DAYS, 1X/DAY X 7 DAYS 06/25/16   [provider]  prednisoLONE acetate (PRED FORTE) 1 % ophthalmic suspension prednisolone acetate 1 % eye drops,suspension  1 DROP BOTH EYES 4X/DAY X 7 DAYS, 3X/DAY X 7 DAYS, 2X/DAY X 7 DAYS, 1X/DAY X 7 DAYS    [provider]  promethazine (PHENERGAN) 25 MG tablet Take by mouth. 10/05/16   [provider]  propranolol ER (INDERAL LA) 60 MG 24 hr capsule Take by mouth. 10/05/16 10/05/17  [provider]  ranitidine (ZANTAC) 150 MG tablet ranitidine 150 mg tablet  Take 1 tablet twice a day by oral route before meals.    [provider]  rizatriptan (MAXALT) 10 MG tablet rizatriptan 10 mg tablet    [provider]  senna (SENOKOT) 8.6 MG TABS tablet Take 2 tablets (17.2 mg total) by mouth 2 (two) times daily. 10/19/16   Carrie Mew, MD  simethicone (MYLICON) 80 MG chewable tablet  Gas Relief 80 mg chewable tablet  CHEW 1 TABLET (80 MG TOTAL) BY MOUTH EVERY 6 (SIX) HOURS AS NEEDED FOR FLATULENCE.    [provider]  traMADol (ULTRAM) 50 MG tablet Take 1 tablet (50 mg total) by mouth every 6 (six) hours as needed. 08/28/16 08/28/17  Lavonia Drafts, MD  traZODone (DESYREL) 50 MG tablet trazodone 50 mg tablet    [provider]  XIIDRA 5 % SOLN INSTILL 1 DROP INTO BOTH EYES TWICE A DAY 07/02/16   [provider]    Allergies Aspartame; Prednisone; Ibuprofen; Latex; and Penicillins  Family History  Problem Relation Age of Onset  . Scoliosis Brother   . Fibromyalgia Mother   . Cancer Mother 98       ovarian; s/p partial hysterectomy with RSO  . Graves' disease Mother   . Alzheimer's disease Other   . Cancer Maternal Grandmother 30  breast cancer (unilateral lumpectomy)  . Breast cancer Maternal Grandmother     Social History Social History  Substance Use Topics  . Smoking status: Former Smoker    Packs/day: 0.50    Years: 1.50    Types: Cigarettes    Quit date: 03/05/2011  . Smokeless tobacco: Never Used  . Alcohol use Yes     Comment: occ    Review of Systems Constitutional: Subjective fever/chills Eyes: No visual changes. ENT: No sore throat. Cardiovascular: Denies chest pain. Respiratory: Denies shortness of breath. Positive productive cough. Gastrointestinal: No abdominal pain.  No nausea, no vomiting.   Genitourinary: Negative for dysuria. Musculoskeletal: Negative for back pain. Skin: Negative for rash. Neurological: Negative for headaches, focal weakness or numbness.   ____________________________________________   PHYSICAL EXAM:  VITAL SIGNS: ED Triage Vitals  Enc Vitals Group     BP 12/09/16 1520 122/62     Pulse Rate 12/09/16 1520 77     Resp 12/09/16 1520 14     Temp 12/09/16 1520 98.3 F (36.8 C)     Temp Source 12/09/16 1520 Oral     SpO2 12/09/16 1520 99 %     Weight 12/09/16 1521 150 lb (68 kg)      Height 12/09/16 1521 5\' 4"  (1.626 m)     Head Circumference --      Peak Flow --      Pain Score 12/09/16 1520 7     Pain Loc --      Pain Edu? --      Excl. in [place]? --     Constitutional: Alert and oriented. Well appearing and in no acute distress. Eyes: Conjunctivae are normal. PERRL. EOMI. Head: Atraumatic. Nose: No congestion/rhinnorhea. Mouth/Throat: Mucous membranes are moist.  Oropharynx non-erythematous. Neck: No stridor.   Hematological/Lymphatic/Immunilogical: No cervical lymphadenopathy. Cardiovascular: Normal rate, regular rhythm. Grossly normal heart sounds.  Good peripheral circulation. Respiratory: Normal respiratory effort.  No retractions. Lungs CTAB. Musculoskeletal: Moves upper and lower extremities without any difficulty. Normal gait was noted. Neurologic:  Normal speech and language. No gross focal neurologic deficits are appreciated. No gait instability. Skin:  Skin is warm, dry and intact. No rash noted. Psychiatric: Mood and affect are normal. Speech and behavior are normal.  ____________________________________________   LABS (all labs ordered are listed, but only abnormal results are displayed)  Labs Reviewed - No data to display  RADIOLOGY  Dg Chest 2 View  Result Date: 12/09/2016 CLINICAL DATA:  Cough and chills, onset today. EXAM: CHEST  2 VIEW COMPARISON:  01/09/2016 FINDINGS: The cardiomediastinal contours are normal. The lungs are clear. Pulmonary vasculature is normal. No consolidation, pleural effusion, or pneumothorax. No acute osseous abnormalities are seen. IMPRESSION: No acute pulmonary process. Electronically Signed   By: Jeb Levering M.D.   On: 12/09/2016 16:37    ____________________________________________   PROCEDURES  Procedure(s) performed: None  Procedures  Critical Care performed: No  ____________________________________________   INITIAL IMPRESSION / ASSESSMENT AND PLAN / ED COURSE  Pertinent labs & imaging  results that were available during my care of the patient were reviewed by me and considered in my medical decision making (see chart for details).  Patient was made aware that her chest x-ray was negative. Patient most likely has a viral upper respiratory infection. She is encouraged to drink more fluids and take Tylenol if needed for body aches or fever. She was given a prescription for Tessalon Perles one every 8 hours as needed for cough. Patient is to  follow-up with her PCP if any continued problems.   ____________________________________________   FINAL CLINICAL IMPRESSION(S) / ED DIAGNOSES  Final diagnoses:  Viral URI with cough      NEW MEDICATIONS STARTED DURING THIS VISIT:  Discharge Medication List as of 12/09/2016  5:43 PM       Note:  This document was prepared using Dragon voice recognition software and may include unintentional dictation errors.    Johnn Hai, PA-C 12/09/16 1759    Nena Polio, MD 12/09/16 2123

## 2016-12-09 NOTE — ED Triage Notes (Signed)
Patient states "I just do not feel well" Patient c/o runny nose, eyes watering, fever, and chills. Denies urinary symptoms. NAD noted in triage

## 2016-12-11 ENCOUNTER — Encounter: Payer: Self-pay | Admitting: Obstetrics and Gynecology

## 2016-12-16 ENCOUNTER — Emergency Department
Admission: EM | Admit: 2016-12-16 | Discharge: 2016-12-16 | Disposition: A | Discharge status: Home / Self Care | Payer: Medicare Other | Attending: Student in an Organized Health Care Education/Training Program | Admitting: Student in an Organized Health Care Education/Training Program

## 2016-12-16 ENCOUNTER — Encounter: Payer: Self-pay | Admitting: Emergency Medicine

## 2016-12-16 ENCOUNTER — Emergency Department: Payer: Medicare Other

## 2016-12-16 DIAGNOSIS — N189 Chronic kidney disease, unspecified: Secondary | ICD-10-CM | POA: Insufficient documentation

## 2016-12-16 DIAGNOSIS — Z79899 Other long term (current) drug therapy: Secondary | ICD-10-CM | POA: Insufficient documentation

## 2016-12-16 DIAGNOSIS — R1031 Right lower quadrant pain: Secondary | ICD-10-CM | POA: Insufficient documentation

## 2016-12-16 DIAGNOSIS — K625 Hemorrhage of anus and rectum: Secondary | ICD-10-CM

## 2016-12-16 DIAGNOSIS — Z91040 Latex allergy status: Secondary | ICD-10-CM | POA: Diagnosis not present

## 2016-12-16 DIAGNOSIS — Z791 Long term (current) use of non-steroidal anti-inflammatories (NSAID): Secondary | ICD-10-CM | POA: Insufficient documentation

## 2016-12-16 DIAGNOSIS — Z87891 Personal history of nicotine dependence: Secondary | ICD-10-CM | POA: Insufficient documentation

## 2016-12-16 DIAGNOSIS — J45909 Unspecified asthma, uncomplicated: Secondary | ICD-10-CM | POA: Diagnosis not present

## 2016-12-16 LAB — COMPREHENSIVE METABOLIC PANEL
ALK PHOS: 47 U/L (ref 38–126)
ALT: 22 U/L (ref 14–54)
AST: 19 U/L (ref 15–41)
Albumin: 4.8 g/dL (ref 3.5–5.0)
Anion gap: 8 (ref 5–15)
BILIRUBIN TOTAL: 0.6 mg/dL (ref 0.3–1.2)
BUN: 16 mg/dL (ref 6–20)
CALCIUM: 9.5 mg/dL (ref 8.9–10.3)
CO2: 27 mmol/L (ref 22–32)
Chloride: 105 mmol/L (ref 101–111)
Creatinine, Ser: 0.64 mg/dL (ref 0.44–1.00)
GFR calc Af Amer: >60 mL/min (ref >60)
GFR calc non Af Amer: >60 mL/min (ref >60)
GLUCOSE: 94 mg/dL (ref 65–99)
Potassium: 3.9 mmol/L (ref 3.5–5.1)
SODIUM: 140 mmol/L (ref 135–145)
Total Protein: 7.3 g/dL (ref 6.5–8.1)

## 2016-12-16 LAB — CBC
HEMATOCRIT: 41.5 % (ref 35.0–47.0)
HEMOGLOBIN: 14.2 g/dL (ref 12.0–16.0)
MCH: 29.4 pg (ref 26.0–34.0)
MCHC: 34.3 g/dL (ref 32.0–36.0)
MCV: 85.7 fL (ref 80.0–100.0)
Platelets: 245 10*3/uL (ref 150–440)
RBC: 4.84 MIL/uL (ref 3.80–5.20)
RDW: 12.9 % (ref 11.5–14.5)
WBC: 8.5 10*3/uL (ref 3.6–11.0)

## 2016-12-16 LAB — TYPE AND SCREEN
ABO/RH(D): A POS
ANTIBODY SCREEN: NEGATIVE

## 2016-12-16 LAB — POCT PREGNANCY, URINE: Preg Test, Ur: NEGATIVE

## 2016-12-16 MED ORDER — LEVETIRACETAM 750 MG PO TABS
750.0000 mg | ORAL_TABLET | Freq: Once | ORAL | Status: AC
  Administered 2016-12-16: 750 mg via ORAL
  Filled 2016-12-16: qty 1

## 2016-12-16 MED ORDER — SODIUM CHLORIDE 0.9 % IV BOLUS (SEPSIS)
1000.0000 mL | Freq: Once | INTRAVENOUS | Status: AC
  Administered 2016-12-16: 1000 mL via INTRAVENOUS

## 2016-12-16 MED ORDER — IOPAMIDOL (ISOVUE-300) INJECTION 61%
100.0000 mL | Freq: Once | INTRAVENOUS | Status: AC | PRN
  Administered 2016-12-16: 100 mL via INTRAVENOUS

## 2016-12-16 NOTE — Discharge Instructions (Signed)

## 2016-12-16 NOTE — ED Triage Notes (Signed)
Pt c/o rectal bleeding dark red in nature that started today. Denies hemorrhoids.  No vomiting. Some RLQ abdominal pain. Has not had period in 6 weeks with negative pregnancy tests.  Reports her ovaries are very large and OB worried about pushing on appendix.  NAD at this time. VSS.  ambulatory to triage.

## 2016-12-16 NOTE — ED Notes (Addendum)
Pt states she had bowel movement today and noticed light and dark blood from her rectum. Pt denies any vaginal bleeding. Pt states she takes a lot of pain medication for her endometriosis and has hx of constipation. Pt states she has been taking laxatives to help.

## 2016-12-16 NOTE — ED Provider Notes (Signed)
[facility] Emergency Department Provider Note    First MD Initiated Contact with Patient 12/16/16 1836     (approximate)  I have reviewed the triage vital signs and the nursing notes.   HISTORY  Chief Complaint Rectal Bleeding    HPI Grace Norris is a 26 y.o. female presents from home with several days of bright red blood per rectum and 2 hours of sudden onset stabbing moderate right lower quadrant pain. Patient with a history of endometriosis as well as large ovarian cysts. Does not have mental cycle in 6 weeks. Denies any fevers. No nausea or vomiting. Pain came out of the blue. She denies any trauma or receptive anal intercourse. No history of hemorrhoids. No history of bleeding disorders.   Past Medical History:  Diagnosis Date  . Anemia   . Asthma    WELL CONTROLLED  . Chronic kidney disease    H/O STONES  . 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 Incidental finding noted on CT scan  . Endometriosis 03/19/2016  . Epilepsy ([place])   . Migraines   . Seizures ([place]) 2016   LAST SEIZURE 1-1.5 YRS AGO  . Sleep apnea    DOES NOT USE CPAP   Family History  Problem Relation Age of Onset  . Scoliosis Brother   . Fibromyalgia Mother   . Cancer Mother 43       ovarian; s/p partial hysterectomy with RSO  . Graves' disease Mother   . Alzheimer's disease Other   . Cancer Maternal Grandmother 30       breast cancer (unilateral lumpectomy)  . Breast cancer Maternal Grandmother    Past Surgical History:  Procedure Laterality Date  . HAND TENDON SURGERY Bilateral 11/2015  . LAPAROSCOPY N/A 03/09/2016   Procedure: LAPAROSCOPY DIAGNOSTIC and D AND C;  Surgeon: Rubie Maid, MD;  Location: [facility] ORS;  Service: Gynecology;  Laterality: N/A;   Patient Active Problem List   Diagnosis Date Noted  . Endometriosis 03/19/2016  . Family history of cancer 10/12/2015  . Pelvic pain in female 10/12/2015      Prior to Admission  medications   Medication Sig Start Date End Date Taking? Authorizing Provider  acetaminophen (TYLENOL 8 HOUR) 650 MG CR tablet Take 1 tablet (650 mg total) by mouth every 8 (eight) hours as needed for pain (mild). 03/09/16   Rubie Maid, MD  albuterol (PROAIR HFA) 108 (90 Base) MCG/ACT inhaler ProAir HFA 90 mcg/actuation aerosol inhaler  INHALE 1 PUFF ORALLY EVERY 6 HOURS AS NEEDED FOR FOR SHORTNESS OF BREATH Ellie Lunch    [provider]  albuterol-ipratropium (COMBIVENT) 18-103 MCG/ACT inhaler Inhale 2 puffs into the lungs every 4 (four) hours as needed for wheezing or shortness of breath.    [provider]  azelastine (OPTIVAR) 0.05 % ophthalmic solution azelastine 0.05 % eye drops  INSTILL 1 DROP INTO BOTH EYES TWICE A DAY    [provider]  baclofen (LIORESAL) 10 MG tablet TAKE 1/2 TO 1 TABLET BY MOUTH EVERY 8 HOURS AS NEEDED FOR MUSCLE SPASM 06/20/16   [provider]  benzonatate (TESSALON PERLES) 100 MG capsule Take 1 capsule (100 mg total) by mouth 3 (three) times daily as needed. 12/09/16 12/09/17  Johnn Hai, PA-C  busPIRone (BUSPAR) 15 MG tablet buspirone 15 mg tablet    [provider]  butalbital-acetaminophen-caffeine (FIORICET, ESGIC) 50-325-40 MG tablet butalbital-acetaminophen-caffeine 50 mg-325 mg-40 mg tablet  TAKE 1 CAPSULE BY MOUTH EVERY 4 HOURS  AS NEEDED FOR UP TO 3 DAYS    [provider]  diclofenac (CATAFLAM) 50 MG tablet TAKE 1 TABLET (50 MG TOTAL) BY MOUTH 2 (TWO) TIMES DAILY AS NEEDED. 10/05/16   [provider]  diclofenac (VOLTAREN) 75 MG EC tablet diclofenac sodium 75 mg tablet,delayed release    [provider]  docusate sodium (COLACE) 100 MG capsule Take 2 capsules (200 mg total) by mouth 2 (two) times daily. 10/19/16   Carrie Mew, MD  eletriptan (RELPAX) 40 MG tablet Relpax 40 mg tablet    [provider]  folic acid (FOLVITE) 1 MG tablet folic acid 1 mg tablet  TAKE 4  TABLETS BY MOUTH EVERY DAY    [provider]  HYDROcodone-acetaminophen (NORCO/VICODIN) 5-325 MG tablet Take 1 tablet by mouth every 6 (six) hours as needed. for pain 08/17/16   [provider]  ketorolac (TORADOL) 10 MG tablet Take 1 tablet (10 mg total) by mouth every 6 (six) hours as needed. 10/20/16   Rubie Maid, MD  levETIRAcetam (KEPPRA) 750 MG tablet levetiracetam 750 mg tablet    [provider]  loteprednol (LOTEMAX) 0.2 % SUSP Apply to eye. 10/06/16   [provider]  Multiple Vitamin (MULTI-VITAMINS) TABS Take by mouth.    [provider]  norethindrone (AYGESTIN) 5 MG tablet norethindrone acetate 5 mg tablet    [provider]  nortriptyline (PAMELOR) 10 MG capsule Take by mouth. 06/02/16 06/02/17  [provider]  Olopatadine HCl (PATADAY) 0.2 % SOLN Pataday 0.2 % eye drops    [provider]  ondansetron (ZOFRAN-ODT) 4 MG disintegrating tablet ondansetron 4 mg disintegrating tablet    [provider]  oxyCODONE-acetaminophen (PERCOCET) 5-325 MG tablet Take 1-2 tablets by mouth every 6 (six) hours as needed. 10/16/16   Earleen Newport, MD  pantoprazole (PROTONIX) 40 MG tablet Take 1 tablet (40 mg total) by mouth daily. 09/05/16 09/05/17  Menshew, Dannielle Karvonen, PA-C  prednisoLONE acetate (PRED FORTE) 1 % ophthalmic suspension 1 DROP BOTH EYES 4X/DAY X 7 DAYS, 3X/DAY X 7 DAYS, 2X/DAY X 7 DAYS, 1X/DAY X 7 DAYS 06/25/16   [provider]  prednisoLONE acetate (PRED FORTE) 1 % ophthalmic suspension prednisolone acetate 1 % eye drops,suspension  1 DROP BOTH EYES 4X/DAY X 7 DAYS, 3X/DAY X 7 DAYS, 2X/DAY X 7 DAYS, 1X/DAY X 7 DAYS    [provider]  promethazine (PHENERGAN) 25 MG tablet Take by mouth. 10/05/16   [provider]  propranolol ER (INDERAL LA) 60 MG 24 hr capsule Take by mouth. 10/05/16 10/05/17  [provider]  ranitidine (ZANTAC) 150 MG tablet ranitidine 150 mg tablet   Take 1 tablet twice a day by oral route before meals.    [provider]  rizatriptan (MAXALT) 10 MG tablet rizatriptan 10 mg tablet    [provider]  senna (SENOKOT) 8.6 MG TABS tablet Take 2 tablets (17.2 mg total) by mouth 2 (two) times daily. 10/19/16   Carrie Mew, MD  simethicone (MYLICON) 80 MG chewable tablet Gas Relief 80 mg chewable tablet  CHEW 1 TABLET (80 MG TOTAL) BY MOUTH EVERY 6 (SIX) HOURS AS NEEDED FOR FLATULENCE.    [provider]  traMADol (ULTRAM) 50 MG tablet Take 1 tablet (50 mg total) by mouth every 6 (six) hours as needed. 08/28/16 08/28/17  Lavonia Drafts, MD  traZODone (DESYREL) 50 MG tablet trazodone 50 mg tablet    [provider]  XIIDRA 5 %  SOLN INSTILL 1 DROP INTO BOTH EYES TWICE A DAY 07/02/16   [provider]    Allergies Aspartame; Prednisone; Ibuprofen; Latex; and Penicillins    Social History Social History  Substance Use Topics  . Smoking status: Former Smoker    Packs/day: 0.50    Years: 1.50    Types: Cigarettes    Quit date: 03/05/2011  . Smokeless tobacco: Never Used  . Alcohol use Yes     Comment: occ    Review of Systems Patient denies headaches, rhinorrhea, blurry vision, numbness, shortness of breath, chest pain, edema, cough, abdominal pain, nausea, vomiting, diarrhea, dysuria, fevers, rashes or hallucinations unless otherwise stated above in HPI. ____________________________________________   PHYSICAL EXAM:  VITAL SIGNS: Vitals:   12/16/16 1645 12/16/16 2015  BP: 119/70 113/67  Pulse: 71 84  Resp: 16 18  Temp: 98.5 F (36.9 C)     Constitutional: Alert and oriented. Well appearing and in no acute distress. Eyes: Conjunctivae are normal.  Head: Atraumatic. Nose: No congestion/rhinnorhea. Mouth/Throat: Mucous membranes are moist.   Neck: No stridor. Painless ROM.  Cardiovascular: Normal rate, regular rhythm. Grossly normal heart sounds.  Good peripheral  circulation. Respiratory: Normal respiratory effort.  No retractions. Lungs CTAB. Gastrointestinal: Soft with ttp of RLQ. No distention. No abdominal bruits. No CVA tenderness. GU: no hemorrhoids or evidence of anal fissure Musculoskeletal: No lower extremity tenderness nor edema.  No joint effusions. Neurologic:  Normal speech and language. No gross focal neurologic deficits are appreciated. No facial droop Skin:  Skin is warm, dry and intact. No rash noted. Psychiatric: Mood and affect are normal. Speech and behavior are normal.  ____________________________________________   LABS (all labs ordered are listed, but only abnormal results are displayed)  Results for orders placed or performed during the hospital encounter of 12/16/16 (from the past 24 hour(s))  Comprehensive metabolic panel     Status: None   Collection Time: 12/16/16  4:44 PM  Result Value Ref Range   Sodium 140 135 - 145 mmol/L   Potassium 3.9 3.5 - 5.1 mmol/L   Chloride 105 101 - 111 mmol/L   CO2 27 22 - 32 mmol/L   Glucose, Bld 94 65 - 99 mg/dL   BUN 16 6 - 20 mg/dL   Creatinine, Ser 0.64 0.44 - 1.00 mg/dL   Calcium 9.5 8.9 - 10.3 mg/dL   Total Protein 7.3 6.5 - 8.1 g/dL   Albumin 4.8 3.5 - 5.0 g/dL   AST 19 15 - 41 U/L   ALT 22 14 - 54 U/L   Alkaline Phosphatase 47 38 - 126 U/L   Total Bilirubin 0.6 0.3 - 1.2 mg/dL   GFR calc non Af Amer >60 >60 mL/min   GFR calc Af Amer >60 >60 mL/min   Anion gap 8 5 - 15  CBC     Status: None   Collection Time: 12/16/16  4:44 PM  Result Value Ref Range   WBC 8.5 3.6 - 11.0 K/uL   RBC 4.84 3.80 - 5.20 MIL/uL   Hemoglobin 14.2 12.0 - 16.0 g/dL   HCT 41.5 35.0 - 47.0 %   MCV 85.7 80.0 - 100.0 fL   MCH 29.4 26.0 - 34.0 pg   MCHC 34.3 32.0 - 36.0 g/dL   RDW 12.9 11.5 - 14.5 %   Platelets 245 150 - 440 K/uL  Type and screen [facility]     Status: None   Collection Time: 12/16/16  4:44 PM  Result  Value Ref Range   ABO/RH(D) A POS    Antibody  Screen NEG    Sample Expiration 12/19/2016   Pregnancy, urine POC     Status: None   Collection Time: 12/16/16  4:57 PM  Result Value Ref Range   Preg Test, Ur NEGATIVE NEGATIVE   ____________________________________________   ____________________________________________  RADIOLOGY  I personally reviewed all radiographic images ordered to evaluate for the above acute complaints and reviewed radiology reports and findings.  These findings were personally discussed with the patient.  Please see medical record for radiology report.  ____________________________________________   PROCEDURES  Procedure(s) performed:  Procedures    Critical Care performed: no ____________________________________________   INITIAL IMPRESSION / ASSESSMENT AND PLAN / ED COURSE  Pertinent labs & imaging results that were available during my care of the patient were reviewed by me and considered in my medical decision making (see chart for details).  DDX: appy, torsion, cyst, constipation, hemorrhoids, fissure  Grace Norris is a 26 y.o. who presents to the ED with right lower quadrant pain and bright red blood per rectum as described above. Exam as above. CT imaging ordered to exclude acute appendicitis as she does have tenderness on exam. The patient will be placed on continuous pulse oximetry and telemetry for monitoring.  Laboratory evaluation will be sent to evaluate for the above complaints.     Clinical Course as of Dec 16 2116  Wed Dec 16, 2016  2116 CT imaging reassuring. This the patient's persistent pain will order ultrasound to further exclude and evaluate for evidence of torsion.  Patient will be signed out to Dr. Kerman Passey pending TVUS.  Have discussed with the patient and available family all diagnostics and treatments performed thus far and all questions were answered to the best of my ability. The patient demonstrates understanding and agreement with plan.   [PR]    Clinical  Course User Index [PR] Merlyn Lot, MD     ____________________________________________   FINAL CLINICAL IMPRESSION(S) / ED DIAGNOSES  Final diagnoses:  Abdominal pain, RLQ  Rectal bleeding      NEW MEDICATIONS STARTED DURING THIS VISIT:  New Prescriptions   No medications on file     Note:  This document was prepared using Dragon voice recognition software and may include unintentional dictation errors.    Merlyn Lot, MD 12/16/16 2120

## 2016-12-16 NOTE — ED Provider Notes (Signed)
-----------------------------------------   11:25 PM on 12/16/2016 -----------------------------------------  Patient care assumed from Dr. Quentin Cornwall. Ultrasound consistent with a simple ovarian cyst but no follow-up recommended. Workup otherwise negative. Patient will be discharged home with PCP follow-up.   Harvest Dark, MD 12/16/16 2326

## 2016-12-16 NOTE — ED Notes (Signed)
Pt up to toilet 

## 2016-12-17 ENCOUNTER — Ambulatory Visit (INDEPENDENT_AMBULATORY_CARE_PROVIDER_SITE_OTHER): Payer: Medicare Other | Admitting: Obstetrics and Gynecology

## 2016-12-17 ENCOUNTER — Encounter: Payer: Self-pay | Admitting: Obstetrics and Gynecology

## 2016-12-17 VITALS — BP 119/92 | HR 105 | Ht 64.0 in | Wt 133.6 lb

## 2016-12-17 DIAGNOSIS — N809 Endometriosis, unspecified: Secondary | ICD-10-CM | POA: Diagnosis not present

## 2016-12-17 DIAGNOSIS — N912 Amenorrhea, unspecified: Secondary | ICD-10-CM | POA: Diagnosis not present

## 2016-12-17 DIAGNOSIS — N83201 Unspecified ovarian cyst, right side: Secondary | ICD-10-CM | POA: Diagnosis not present

## 2016-12-17 NOTE — Progress Notes (Signed)
GYNECOLOGY PROGRESS NOTE  Subjective:    Patient ID: Grace Norris, female    DOB: 1990-09-10, 26 y.o.   MRN: [id number]  HPI  Patient is a 26 y.o. [id number] female with a h/o endometriosis who presents for ER follow-up for a right ovarian cyst. Patient was seen in the emergency room yesterday with complaints of right sided abdominal pain and rectal bleeding. She was diagnosed with external hemorrhoids,  and on CT scan as well as ultrasound she was noted to have a 3 cm right-sided simple cyst.  Although no follow-up was required, patient noted that she wanted to be seen as she was concerned about the cyst and her family history of ovarian cancer.  Of note patient also notes that she has not had a menstrual cycle in approximately 6 weeks. Pregnancy test in the emergency room was negative.   The following portions of the patient's history were reviewed and updated as appropriate: allergies, current medications, past family history, past medical history, past social history, past surgical history and problem list.  Review of Systems Pertinent items noted in HPI and remainder of comprehensive ROS otherwise negative.   Objective:   Blood pressure (!) 119/92, pulse (!) 105, height 5\' 4"  (1.626 m), weight 133 lb 9.6 oz (60.6 kg), last menstrual period 11/04/2016. General appearance: alert and no distress Abdomen: soft, non-tender; bowel sounds normal; no masses,  no organomegaly Pelvic: deferred    Imaging:  CLINICAL DATA:  Initial evaluation for acute right lower quadrant abdominal pain.  EXAM: TRANSABDOMINAL AND TRANSVAGINAL ULTRASOUND OF PELVIS  DOPPLER ULTRASOUND OF OVARIES  TECHNIQUE: Both transabdominal and transvaginal ultrasound examinations of the pelvis were performed. Transabdominal technique was performed for global imaging of the pelvis including uterus, ovaries, adnexal regions, and pelvic cul-de-sac.  It was necessary to proceed with endovaginal exam following  the transabdominal exam to visualize the uterus and ovaries. Color and duplex Doppler ultrasound was utilized to evaluate blood flow to the ovaries.  COMPARISON:  Prior CT from earlier the same day.  FINDINGS: Uterus  Measurements: 7.7 x 4.7 x 5.5 cm. No fibroids or other mass visualized.  Endometrium  Thickness: 4.5 mm.  No focal abnormality visualized.  Right ovary  Measurements: 3.7 x 2.9 x 3.6 cm. 3.2 x 2.4 x 2.9 cm simple anechoic cyst, most consistent with a normal physiologic cyst. No internal complexity or concerning features.  Left ovary  Measurements: 2.7 x 1.3 x 1.5 cm. Normal appearance/no adnexal mass.  Pulsed Doppler evaluation of both ovaries demonstrates normal low-resistance arterial and venous waveforms.  Other findings  No abnormal free fluid.  IMPRESSION: 1. 3.2 x 2.4 x 2.9 cm simple right ovarian cyst, most consistent with a normal physiologic cyst. No follow-up is recommended for this lesion. This recommendation follows the consensus statement: Management of Asymptomatic Ovarian and Other Adnexal Cysts Imaged at [place]: Society of Radiologists in [place]. Radiology 2010; [phone]. 2. Otherwise normal pelvic ultrasound. No acute abnormality identified. No evidence for torsion. Assessment:   Simple right ovarian cyst Amenorrhea H/o endometriosis  Plan:   - Patient with small simple right ovarian cyst. Discussed with patient that this was usually self-limited, and will resolve with time and does not require intervention. Also discussed with patient that the simple cyst does not increase her risk for ovarian cancer.  Advised on alternating with Tylenol extra strength and ibuprofen 800 mg q 8 hrs as needed for discomfort.  -  Amenorrhea 6 weeks. Patient notes that  this has never occurred before. Pregnancy test in the emergency room negative. Patient does have a history of receiving initial Depo-Provera  injection in April, however can no longer receive injections due to her medications for her history of seizures (per her neurologist).  Unsure if this is the cause of amenorrhea as patient had been experiencing continuous bleeding during April and May. Patient would be approximately 1-2 weeks out from requiring a second dose of injection if she could have continued. It is possible that the Depo-Provera injection did cause amenorrhea. Advised patient to wait an additional month to see if her menstrual cycle does return. If it does not return will address during her annual exam next month. - History of endometriosis, patient has tried OCPs, Mirena IUD, and now Depo-Provera. However per patient, her neurologist does not advise her to be on any type of hormonal therapy due to the medications she is currently on for seizures.  Discussed nonhormonal methods and more natural remedies, including pelvic floor physical therapy, can try acupuncture, and given a list of herbal remedies to try.  Referral had been placed in the past for pelvic floor physical therapy, however patient notes that she received a phone call to schedule her appointment during her vacation, and has not had the opportunity to call them back. Encouraged patient to schedule an appointment. - Patient to follow up in 1 month for her scheduled annual physical exam.    A total of 15 minutes were spent face-to-face with the patient during this encounter and over half of that time dealt with counseling and coordination of care.   Rubie Maid, MD [facility]

## 2016-12-19 ENCOUNTER — Emergency Department
Admission: EM | Admit: 2016-12-19 | Discharge: 2016-12-19 | Disposition: A | Discharge status: Home / Self Care | Payer: Medicare Other | Attending: Emergency Medicine | Admitting: Emergency Medicine

## 2016-12-19 ENCOUNTER — Encounter: Payer: Self-pay | Admitting: Emergency Medicine

## 2016-12-19 DIAGNOSIS — R1031 Right lower quadrant pain: Secondary | ICD-10-CM | POA: Diagnosis not present

## 2016-12-19 DIAGNOSIS — Z5321 Procedure and treatment not carried out due to patient leaving prior to being seen by health care provider: Secondary | ICD-10-CM | POA: Diagnosis not present

## 2016-12-19 DIAGNOSIS — N939 Abnormal uterine and vaginal bleeding, unspecified: Secondary | ICD-10-CM | POA: Insufficient documentation

## 2016-12-19 LAB — URINALYSIS, COMPLETE (UACMP) WITH MICROSCOPIC
BILIRUBIN URINE: NEGATIVE
Glucose, UA: NEGATIVE mg/dL
Hgb urine dipstick: NEGATIVE
KETONES UR: 5 mg/dL — AB
LEUKOCYTES UA: NEGATIVE
NITRITE: NEGATIVE
Protein, ur: 30 mg/dL — AB
Specific Gravity, Urine: 1.041 — ABNORMAL HIGH (ref 1.005–1.030)
pH: 5 (ref 5.0–8.0)

## 2016-12-19 LAB — COMPREHENSIVE METABOLIC PANEL
ALBUMIN: 4.1 g/dL (ref 3.5–5.0)
ALK PHOS: 40 U/L (ref 38–126)
ALT: 18 U/L (ref 14–54)
ANION GAP: 6 (ref 5–15)
AST: 18 U/L (ref 15–41)
BILIRUBIN TOTAL: 0.4 mg/dL (ref 0.3–1.2)
BUN: 18 mg/dL (ref 6–20)
CALCIUM: 9 mg/dL (ref 8.9–10.3)
CO2: 26 mmol/L (ref 22–32)
Chloride: 109 mmol/L (ref 101–111)
Creatinine, Ser: 0.74 mg/dL (ref 0.44–1.00)
GFR calc Af Amer: >60 mL/min (ref >60)
GLUCOSE: 115 mg/dL — AB (ref 65–99)
Potassium: 3.7 mmol/L (ref 3.5–5.1)
Sodium: 141 mmol/L (ref 135–145)
TOTAL PROTEIN: 6.5 g/dL (ref 6.5–8.1)

## 2016-12-19 LAB — POCT PREGNANCY, URINE: Preg Test, Ur: NEGATIVE

## 2016-12-19 LAB — CBC
HCT: 40.5 % (ref 35.0–47.0)
Hemoglobin: 14 g/dL (ref 12.0–16.0)
MCH: 29.5 pg (ref 26.0–34.0)
MCHC: 34.6 g/dL (ref 32.0–36.0)
MCV: 85.3 fL (ref 80.0–100.0)
Platelets: 231 10*3/uL (ref 150–440)
RBC: 4.75 MIL/uL (ref 3.80–5.20)
RDW: 13 % (ref 11.5–14.5)
WBC: 7.2 10*3/uL (ref 3.6–11.0)

## 2016-12-19 LAB — LIPASE, BLOOD: Lipase: 30 U/L (ref 11–51)

## 2016-12-19 NOTE — ED Triage Notes (Addendum)
Pt presents to ED c/o RLQ pain. Was seen in ED for same 12/16/16 and told she has a simple ovarian cyst and to return if pain worsens. Tender to palpation. +spotting, dark red blood, denies vaginal discharge.

## 2016-12-19 NOTE — ED Notes (Signed)
Registration states patient states she was leaving without being seen.

## 2016-12-26 ENCOUNTER — Emergency Department
Admission: EM | Admit: 2016-12-26 | Discharge: 2016-12-26 | Disposition: A | Discharge status: Home / Self Care | Payer: Medicare Other | Attending: Emergency Medicine | Admitting: Emergency Medicine

## 2016-12-26 ENCOUNTER — Encounter: Payer: Self-pay | Admitting: Emergency Medicine

## 2016-12-26 DIAGNOSIS — Z5321 Procedure and treatment not carried out due to patient leaving prior to being seen by health care provider: Secondary | ICD-10-CM | POA: Insufficient documentation

## 2016-12-26 DIAGNOSIS — R197 Diarrhea, unspecified: Secondary | ICD-10-CM | POA: Diagnosis not present

## 2016-12-26 DIAGNOSIS — R1031 Right lower quadrant pain: Secondary | ICD-10-CM | POA: Insufficient documentation

## 2016-12-26 DIAGNOSIS — R112 Nausea with vomiting, unspecified: Secondary | ICD-10-CM | POA: Diagnosis not present

## 2016-12-26 LAB — URINALYSIS, COMPLETE (UACMP) WITH MICROSCOPIC
BILIRUBIN URINE: NEGATIVE
GLUCOSE, UA: NEGATIVE mg/dL
KETONES UR: NEGATIVE mg/dL
Leukocytes, UA: NEGATIVE
NITRITE: NEGATIVE
PROTEIN: NEGATIVE mg/dL
Specific Gravity, Urine: 1.02 (ref 1.005–1.030)
pH: 6 (ref 5.0–8.0)

## 2016-12-26 LAB — CBC
HEMATOCRIT: 40.3 % (ref 35.0–47.0)
HEMOGLOBIN: 13.9 g/dL (ref 12.0–16.0)
MCH: 29.5 pg (ref 26.0–34.0)
MCHC: 34.4 g/dL (ref 32.0–36.0)
MCV: 85.6 fL (ref 80.0–100.0)
Platelets: 212 10*3/uL (ref 150–440)
RBC: 4.7 MIL/uL (ref 3.80–5.20)
RDW: 13.1 % (ref 11.5–14.5)
WBC: 6.7 10*3/uL (ref 3.6–11.0)

## 2016-12-26 LAB — COMPREHENSIVE METABOLIC PANEL
ALK PHOS: 39 U/L (ref 38–126)
ALT: 20 U/L (ref 14–54)
ANION GAP: 6 (ref 5–15)
AST: 21 U/L (ref 15–41)
Albumin: 4.4 g/dL (ref 3.5–5.0)
BILIRUBIN TOTAL: 0.6 mg/dL (ref 0.3–1.2)
BUN: 13 mg/dL (ref 6–20)
CALCIUM: 9.3 mg/dL (ref 8.9–10.3)
CO2: 26 mmol/L (ref 22–32)
Chloride: 106 mmol/L (ref 101–111)
Creatinine, Ser: 0.64 mg/dL (ref 0.44–1.00)
GFR calc non Af Amer: >60 mL/min (ref >60)
Glucose, Bld: 92 mg/dL (ref 65–99)
POTASSIUM: 4.1 mmol/L (ref 3.5–5.1)
SODIUM: 138 mmol/L (ref 135–145)
TOTAL PROTEIN: 6.7 g/dL (ref 6.5–8.1)

## 2016-12-26 LAB — LIPASE, BLOOD: Lipase: 29 U/L (ref 11–51)

## 2016-12-26 LAB — POCT PREGNANCY, URINE: Preg Test, Ur: NEGATIVE

## 2016-12-26 NOTE — ED Triage Notes (Signed)
Patient aware next to be seen but no bed available at this moment. Patient leaves and states will follow up with PCP on Monday.

## 2016-12-26 NOTE — ED Triage Notes (Signed)
Pt reports RLQ pain since yesterday, nausea, vomiting and diarrhea. Last episode of emesis 30 minutes PTA, and last episode of diarrhea one hr prior to arrival, pt talks in complete sentences no distress noted

## 2016-12-28 ENCOUNTER — Encounter: Payer: Self-pay | Admitting: Obstetrics and Gynecology

## 2016-12-28 ENCOUNTER — Ambulatory Visit: Payer: Medicare Other | Admitting: Physical Therapy

## 2016-12-29 NOTE — Telephone Encounter (Signed)
Patient is calling about records  Call to [facility]

## 2017-01-14 ENCOUNTER — Telehealth: Payer: Self-pay | Admitting: Obstetrics and Gynecology

## 2017-01-14 NOTE — Telephone Encounter (Signed)
Patient is having a lot of pain in her lower abdomin for the past 4 days - she has therapy next week - but what can she do for the pain for now  Please call

## 2017-01-15 ENCOUNTER — Emergency Department
Admission: EM | Admit: 2017-01-15 | Discharge: 2017-01-15 | Disposition: A | Discharge status: Home / Self Care | Payer: Medicare Other | Attending: Student in an Organized Health Care Education/Training Program | Admitting: Student in an Organized Health Care Education/Training Program

## 2017-01-15 ENCOUNTER — Encounter: Payer: Self-pay | Admitting: Emergency Medicine

## 2017-01-15 DIAGNOSIS — Z79899 Other long term (current) drug therapy: Secondary | ICD-10-CM | POA: Diagnosis not present

## 2017-01-15 DIAGNOSIS — Z91040 Latex allergy status: Secondary | ICD-10-CM | POA: Diagnosis not present

## 2017-01-15 DIAGNOSIS — R5383 Other fatigue: Secondary | ICD-10-CM | POA: Diagnosis not present

## 2017-01-15 DIAGNOSIS — R42 Dizziness and giddiness: Secondary | ICD-10-CM | POA: Diagnosis present

## 2017-01-15 DIAGNOSIS — N189 Chronic kidney disease, unspecified: Secondary | ICD-10-CM | POA: Diagnosis not present

## 2017-01-15 DIAGNOSIS — J45909 Unspecified asthma, uncomplicated: Secondary | ICD-10-CM | POA: Insufficient documentation

## 2017-01-15 LAB — URINALYSIS, COMPLETE (UACMP) WITH MICROSCOPIC
Bacteria, UA: NONE SEEN
Bilirubin Urine: NEGATIVE
GLUCOSE, UA: NEGATIVE mg/dL
HGB URINE DIPSTICK: NEGATIVE
Ketones, ur: NEGATIVE mg/dL
Leukocytes, UA: NEGATIVE
NITRITE: NEGATIVE
PH: 6 (ref 5.0–8.0)
Protein, ur: NEGATIVE mg/dL
Specific Gravity, Urine: 1.027 (ref 1.005–1.030)

## 2017-01-15 LAB — BASIC METABOLIC PANEL
Anion gap: 8 (ref 5–15)
BUN: 21 mg/dL — ABNORMAL HIGH (ref 6–20)
CALCIUM: 9.4 mg/dL (ref 8.9–10.3)
CO2: 26 mmol/L (ref 22–32)
CREATININE: 0.74 mg/dL (ref 0.44–1.00)
Chloride: 104 mmol/L (ref 101–111)
GFR calc Af Amer: >60 mL/min (ref >60)
Glucose, Bld: 104 mg/dL — ABNORMAL HIGH (ref 65–99)
Potassium: 4.3 mmol/L (ref 3.5–5.1)
SODIUM: 138 mmol/L (ref 135–145)

## 2017-01-15 LAB — CBC
HCT: 40.3 % (ref 35.0–47.0)
Hemoglobin: 14 g/dL (ref 12.0–16.0)
MCH: 30 pg (ref 26.0–34.0)
MCHC: 34.6 g/dL (ref 32.0–36.0)
MCV: 86.6 fL (ref 80.0–100.0)
PLATELETS: 266 10*3/uL (ref 150–440)
RBC: 4.66 MIL/uL (ref 3.80–5.20)
RDW: 13.3 % (ref 11.5–14.5)
WBC: 6.7 10*3/uL (ref 3.6–11.0)

## 2017-01-15 LAB — TROPONIN I: Troponin I: <0.03 ng/mL (ref <0.03)

## 2017-01-15 LAB — POCT PREGNANCY, URINE: Preg Test, Ur: NEGATIVE

## 2017-01-15 NOTE — ED Provider Notes (Signed)
[facility] Emergency Department Provider Note    First MD Initiated Contact with Patient 01/15/17 2101     (approximate)  I have reviewed the triage vital signs and the nursing notes.   HISTORY  Chief Complaint Dizziness and Weakness    HPI Grace Norris is a 26 y.o. female with chief complaint of several days and weeks of lightheadedness dizziness and fatigue. Patient with recent change in her medication increasing her Celexa and trazodone. Denies any numbness or tingling. States that she just simply sleeping more. Has also recently started ketogenic diet. Has some chronic abdominal pain she relates to endometriosis. Denies any fevers. No syncopal events. No chest pain or shortness of breath.   Past Medical History:  Diagnosis Date  . Anemia   . Asthma    WELL CONTROLLED  . Chronic kidney disease    H/O STONES  . 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 Incidental finding noted on CT scan  . Endometriosis 03/19/2016  . Epilepsy ([place])   . Migraines   . Seizures ([place]) 2016   LAST SEIZURE 1-1.5 YRS AGO  . Sleep apnea    DOES NOT USE CPAP   Family History  Problem Relation Age of Onset  . Scoliosis Brother   . Fibromyalgia Mother   . Cancer Mother 58       ovarian; s/p partial hysterectomy with RSO  . Graves' disease Mother   . Alzheimer's disease Other   . Cancer Maternal Grandmother 30       breast cancer (unilateral lumpectomy)  . Breast cancer Maternal Grandmother    Past Surgical History:  Procedure Laterality Date  . HAND TENDON SURGERY Bilateral 11/2015  . LAPAROSCOPY N/A 03/09/2016   Procedure: LAPAROSCOPY DIAGNOSTIC and D AND C;  Surgeon: Rubie Maid, MD;  Location: [facility] ORS;  Service: Gynecology;  Laterality: N/A;   Patient Active Problem List   Diagnosis Date Noted  . Endometriosis 03/19/2016  . Family history of cancer 10/12/2015  . Pelvic pain in female 10/12/2015      Prior to Admission  medications   Medication Sig Start Date End Date Taking? Authorizing Provider  acetaminophen (TYLENOL 8 HOUR) 650 MG CR tablet Take 1 tablet (650 mg total) by mouth every 8 (eight) hours as needed for pain (mild). 03/09/16   Rubie Maid, MD  albuterol (PROAIR HFA) 108 (90 Base) MCG/ACT inhaler ProAir HFA 90 mcg/actuation aerosol inhaler  INHALE 1 PUFF ORALLY EVERY 6 HOURS AS NEEDED FOR FOR SHORTNESS OF BREATH Ellie Lunch    [provider]  albuterol-ipratropium (COMBIVENT) 18-103 MCG/ACT inhaler Inhale 2 puffs into the lungs every 4 (four) hours as needed for wheezing or shortness of breath.    [provider]  azelastine (OPTIVAR) 0.05 % ophthalmic solution azelastine 0.05 % eye drops  INSTILL 1 DROP INTO BOTH EYES TWICE A DAY    [provider]  baclofen (LIORESAL) 10 MG tablet TAKE 1/2 TO 1 TABLET BY MOUTH EVERY 8 HOURS AS NEEDED FOR MUSCLE SPASM 06/20/16   [provider]  benzonatate (TESSALON PERLES) 100 MG capsule Take 1 capsule (100 mg total) by mouth 3 (three) times daily as needed. 12/09/16 12/09/17  Johnn Hai, PA-C  busPIRone (BUSPAR) 15 MG tablet buspirone 15 mg tablet    [provider]  butalbital-acetaminophen-caffeine (FIORICET, ESGIC) 50-325-40 MG tablet butalbital-acetaminophen-caffeine 50 mg-325 mg-40 mg tablet  TAKE 1 CAPSULE BY MOUTH EVERY 4 HOURS AS NEEDED FOR UP TO 3 DAYS  [provider]  diclofenac (CATAFLAM) 50 MG tablet TAKE 1 TABLET (50 MG TOTAL) BY MOUTH 2 (TWO) TIMES DAILY AS NEEDED. 10/05/16   [provider]  diclofenac (VOLTAREN) 75 MG EC tablet diclofenac sodium 75 mg tablet,delayed release    [provider]  docusate sodium (COLACE) 100 MG capsule Take 2 capsules (200 mg total) by mouth 2 (two) times daily. 10/19/16   Carrie Mew, MD  eletriptan (RELPAX) 40 MG tablet Relpax 40 mg tablet    [provider]  folic acid (FOLVITE) 1 MG tablet folic acid 1 mg tablet  TAKE 4  TABLETS BY MOUTH EVERY DAY    [provider]  HYDROcodone-acetaminophen (NORCO/VICODIN) 5-325 MG tablet Take 1 tablet by mouth every 6 (six) hours as needed. for pain 08/17/16   [provider]  ketorolac (TORADOL) 10 MG tablet Take 1 tablet (10 mg total) by mouth every 6 (six) hours as needed. 10/20/16   Rubie Maid, MD  levETIRAcetam (KEPPRA) 750 MG tablet levetiracetam 750 mg tablet    [provider]  loteprednol (LOTEMAX) 0.2 % SUSP Apply to eye. 10/06/16   [provider]  Multiple Vitamin (MULTI-VITAMINS) TABS Take by mouth.    [provider]  norethindrone (AYGESTIN) 5 MG tablet norethindrone acetate 5 mg tablet    [provider]  nortriptyline (PAMELOR) 10 MG capsule Take by mouth. 06/02/16 06/02/17  [provider]  Olopatadine HCl (PATADAY) 0.2 % SOLN Pataday 0.2 % eye drops    [provider]  ondansetron (ZOFRAN-ODT) 4 MG disintegrating tablet ondansetron 4 mg disintegrating tablet    [provider]  oxyCODONE-acetaminophen (PERCOCET) 5-325 MG tablet Take 1-2 tablets by mouth every 6 (six) hours as needed. 10/16/16   Earleen Newport, MD  pantoprazole (PROTONIX) 40 MG tablet Take 1 tablet (40 mg total) by mouth daily. 09/05/16 09/05/17  Menshew, Dannielle Karvonen, PA-C  prednisoLONE acetate (PRED FORTE) 1 % ophthalmic suspension prednisolone acetate 1 % eye drops,suspension  1 DROP BOTH EYES 4X/DAY X 7 DAYS, 3X/DAY X 7 DAYS, 2X/DAY X 7 DAYS, 1X/DAY X 7 DAYS    [provider]  promethazine (PHENERGAN) 25 MG tablet Take by mouth. 10/05/16   [provider]  propranolol ER (INDERAL LA) 60 MG 24 hr capsule Take by mouth. 10/05/16 10/05/17  [provider]  ranitidine (ZANTAC) 150 MG tablet ranitidine 150 mg tablet  Take 1 tablet twice a day by oral route before meals.    [provider]  rizatriptan (MAXALT) 10 MG tablet rizatriptan 10 mg tablet    [provider]  senna  (SENOKOT) 8.6 MG TABS tablet Take 2 tablets (17.2 mg total) by mouth 2 (two) times daily. 10/19/16   Carrie Mew, MD  simethicone (MYLICON) 80 MG chewable tablet Gas Relief 80 mg chewable tablet  CHEW 1 TABLET (80 MG TOTAL) BY MOUTH EVERY 6 (SIX) HOURS AS NEEDED FOR FLATULENCE.    [provider]  traMADol (ULTRAM) 50 MG tablet Take 1 tablet (50 mg total) by mouth every 6 (six) hours as needed. 08/28/16 08/28/17  Lavonia Drafts, MD  traZODone (DESYREL) 50 MG tablet trazodone 50 mg tablet    [provider]  XIIDRA 5 % SOLN INSTILL 1 DROP INTO BOTH EYES TWICE A DAY 07/02/16   [provider]    Allergies Aspartame; Prednisone; Ibuprofen; Latex; and Penicillins    Social History Social History  Substance Use Topics  . Smoking status: Former Smoker  Packs/day: 0.50    Years: 1.50    Types: Cigarettes    Quit date: 03/05/2011  . Smokeless tobacco: Never Used  . Alcohol use Yes     Comment: occ    Review of Systems Patient denies headaches, rhinorrhea, blurry vision, numbness, shortness of breath, chest pain, edema, cough, abdominal pain, nausea, vomiting, diarrhea, dysuria, fevers, rashes or hallucinations unless otherwise stated above in HPI. ____________________________________________   PHYSICAL EXAM:  VITAL SIGNS: Vitals:   01/15/17 2011  BP: 123/64  Pulse: 90  Resp: 16  Temp: 98.3 F (36.8 C)  SpO2: 98%    Constitutional: Alert and oriented. Well appearing and in no acute distress. Eyes: Conjunctivae are normal.  Head: Atraumatic. Nose: No congestion/rhinnorhea. Mouth/Throat: Mucous membranes are moist.   Neck: No stridor. Painless ROM.  Cardiovascular: Normal rate, regular rhythm. Grossly normal heart sounds.  Good peripheral circulation. Respiratory: Normal respiratory effort.  No retractions. Lungs CTAB. Gastrointestinal: Soft and nontender. No distention. No abdominal bruits. No CVA tenderness. Musculoskeletal: No lower extremity  tenderness nor edema.  No joint effusions. Neurologic:  Normal speech and language. No gross focal neurologic deficits are appreciated. No facial droop Skin:  Skin is warm, dry and intact. No rash noted. Psychiatric: Mood and affect are normal. Speech and behavior are normal.  ____________________________________________   LABS (all labs ordered are listed, but only abnormal results are displayed)  Results for orders placed or performed during the hospital encounter of 01/15/17 (from the past 24 hour(s))  Basic metabolic panel     Status: Abnormal   Collection Time: 01/15/17  8:10 PM  Result Value Ref Range   Sodium 138 135 - 145 mmol/L   Potassium 4.3 3.5 - 5.1 mmol/L   Chloride 104 101 - 111 mmol/L   CO2 26 22 - 32 mmol/L   Glucose, Bld 104 (H) 65 - 99 mg/dL   BUN 21 (H) 6 - 20 mg/dL   Creatinine, Ser 0.74 0.44 - 1.00 mg/dL   Calcium 9.4 8.9 - 10.3 mg/dL   GFR calc non Af Amer >60 >60 mL/min   GFR calc Af Amer >60 >60 mL/min   Anion gap 8 5 - 15  CBC     Status: None   Collection Time: 01/15/17  8:10 PM  Result Value Ref Range   WBC 6.7 3.6 - 11.0 K/uL   RBC 4.66 3.80 - 5.20 MIL/uL   Hemoglobin 14.0 12.0 - 16.0 g/dL   HCT 40.3 35.0 - 47.0 %   MCV 86.6 80.0 - 100.0 fL   MCH 30.0 26.0 - 34.0 pg   MCHC 34.6 32.0 - 36.0 g/dL   RDW 13.3 11.5 - 14.5 %   Platelets 266 150 - 440 K/uL  Urinalysis, Complete w Microscopic     Status: Abnormal   Collection Time: 01/15/17  8:10 PM  Result Value Ref Range   Color, Urine YELLOW (A) YELLOW   APPearance HAZY (A) CLEAR   Specific Gravity, Urine 1.027 1.005 - 1.030   pH 6.0 5.0 - 8.0   Glucose, UA NEGATIVE NEGATIVE mg/dL   Hgb urine dipstick NEGATIVE NEGATIVE   Bilirubin Urine NEGATIVE NEGATIVE   Ketones, ur NEGATIVE NEGATIVE mg/dL   Protein, ur NEGATIVE NEGATIVE mg/dL   Nitrite NEGATIVE NEGATIVE   Leukocytes, UA NEGATIVE NEGATIVE   RBC / HPF 0-5 0 - 5 RBC/hpf   WBC, UA 0-5 0 - 5 WBC/hpf   Bacteria, UA NONE SEEN NONE SEEN    Squamous Epithelial / LPF  0-5 (A) NONE SEEN   Mucous PRESENT   Troponin I     Status: None   Collection Time: 01/15/17  8:10 PM  Result Value Ref Range   Troponin I <0.03 <0.03 ng/mL  Pregnancy, urine POC     Status: None   Collection Time: 01/15/17  8:23 PM  Result Value Ref Range   Preg Test, Ur NEGATIVE NEGATIVE   ____________________________________________ ____________________________________________  RADIOLOGY   ____________________________________________   PROCEDURES  Procedure(s) performed:  Procedures    Critical Care performed: no ____________________________________________   INITIAL IMPRESSION / ASSESSMENT AND PLAN / ED COURSE  Pertinent labs & imaging results that were available during my care of the patient were reviewed by me and considered in my medical decision making (see chart for details).  DDX: dehydration, med effect, pregnancy  AMIRE GOSSEN is a 26 y.o. who presents to the ED with symptoms as described above. Neuro exam is nonfocal. She is well-appearing and in no acute distress. Blood work shows no acute abnormality. Possible component of her recent change in medications as well as diet. This point I do not feel that further diagnostic testing is clinically indicated and that she is stable for follow-up with her PCP.      ____________________________________________   FINAL CLINICAL IMPRESSION(S) / ED DIAGNOSES  Final diagnoses:  Fatigue, unspecified type  Dizziness      NEW MEDICATIONS STARTED DURING THIS VISIT:  New Prescriptions   No medications on file     Note:  This document was prepared using Dragon voice recognition software and may include unintentional dictation errors.    Merlyn Lot, MD 01/15/17 2116

## 2017-01-15 NOTE — ED Triage Notes (Signed)
Pt reports she has HX of epilepsy and for the past week she has had increased feeling of weakness and intermittent episodes of dizziness. Pt seen neurologist x2 weeks ago and told she had "seizure threshold." Pt takes 1500mg  Keppra, 20mg  Celexa, 25mg  Trazodone per day.

## 2017-01-18 ENCOUNTER — Encounter: Payer: Self-pay | Admitting: Obstetrics and Gynecology

## 2017-01-20 ENCOUNTER — Ambulatory Visit: Payer: Medicare Other | Attending: Obstetrics and Gynecology | Admitting: Physical Therapy

## 2017-01-20 DIAGNOSIS — R278 Other lack of coordination: Secondary | ICD-10-CM | POA: Insufficient documentation

## 2017-01-20 DIAGNOSIS — R531 Weakness: Secondary | ICD-10-CM

## 2017-01-20 DIAGNOSIS — M4122 Other idiopathic scoliosis, cervical region: Secondary | ICD-10-CM

## 2017-01-20 DIAGNOSIS — M4125 Other idiopathic scoliosis, thoracolumbar region: Secondary | ICD-10-CM

## 2017-01-20 NOTE — Therapy (Addendum)
[place] MAIN [facility] SERVICES [address] [place], [place], [address] Phone: [phone]   Fax:  [phone]  Patient Details  Name: Grace Norris MRN: [id number] Date of Birth: Dec 12, 1990 Referring Provider:  Rubie Maid, MD  Encounter Date: 01/20/2017  Pt reports she was hospitalized for 3 days at the end of July for weakness. Her neurologist is currently working with her regarding her epilepsy and anxiety. Due to her hospitalization, pt was deferred from PT today until an new order will be obtained from her referring provider, Dr. Marcelline Mates.   PT and pt discussed her POC as pt expressed her current situation related to multiple medical conditions.    Pt states she does not feel optimistic about pelvic health PT for alleviating her abdominal/ pelvic pain and also feels overwhelmed to follow up with all of her medical visits. Pt would like to prioritize getting her epilepsy and anxiety under control and would prefer to get a hysterectomy to alleviate her pelvic pain.  Pt feels very stressed because she also has to take care of a L ankle injury that occurred at the end of June. Pt arrived today wearing an orthopedic boot. Pt will  f/u with her orthopedic surgeon and will be undergoing PT for her ankle for 4 weeks with travel from [place] to [place]. Pt also will be need to travel to [facility] in October for psychiatry appointments.   No Tx was provided today. 45 min session was all pt discussion and education.  Education material was provided to pt re: pelvic PT and the Tx of endometriosis. Pt was also recommended to wear a R shoe with proper sole support instead of a sandal in order to maintain equal leg length when wearing her orthopedic boot. Pt was also educated about the PT findings from her evaluation and discussed about the impact of diastasis recti on abdomino-pelvic pain and how manual Tx and specific deep core exercise would beneficial.  PT also  explained about PT Tx for relaxation and abdominal massage to address pain.    Education material included:   [email or web address]  [email or web address]  Plan to communicate with Dr. Marcelline Mates re: pt's POC.   Jerl Mina ,PT, DPT, E-RYT  01/20/2017, 11:27 AM  [place] MAIN [facility] SERVICES [address] [place], [place], [address] Phone: [phone]   Fax:  [phone]

## 2017-01-21 ENCOUNTER — Emergency Department: Payer: Medicare Other

## 2017-01-21 ENCOUNTER — Emergency Department
Admission: EM | Admit: 2017-01-21 | Discharge: 2017-01-21 | Disposition: A | Discharge status: Home / Self Care | Payer: Medicare Other | Attending: Emergency Medicine | Admitting: Emergency Medicine

## 2017-01-21 ENCOUNTER — Encounter: Payer: Medicare Other | Admitting: Obstetrics and Gynecology

## 2017-01-21 DIAGNOSIS — Z87891 Personal history of nicotine dependence: Secondary | ICD-10-CM | POA: Diagnosis not present

## 2017-01-21 DIAGNOSIS — Z791 Long term (current) use of non-steroidal anti-inflammatories (NSAID): Secondary | ICD-10-CM | POA: Diagnosis not present

## 2017-01-21 DIAGNOSIS — Z91040 Latex allergy status: Secondary | ICD-10-CM | POA: Insufficient documentation

## 2017-01-21 DIAGNOSIS — J45909 Unspecified asthma, uncomplicated: Secondary | ICD-10-CM | POA: Insufficient documentation

## 2017-01-21 DIAGNOSIS — N189 Chronic kidney disease, unspecified: Secondary | ICD-10-CM | POA: Diagnosis not present

## 2017-01-21 DIAGNOSIS — R1032 Left lower quadrant pain: Secondary | ICD-10-CM | POA: Insufficient documentation

## 2017-01-21 DIAGNOSIS — Z79899 Other long term (current) drug therapy: Secondary | ICD-10-CM | POA: Diagnosis not present

## 2017-01-21 LAB — COMPREHENSIVE METABOLIC PANEL
ALBUMIN: 4.4 g/dL (ref 3.5–5.0)
ALK PHOS: 42 U/L (ref 38–126)
ALT: 23 U/L (ref 14–54)
AST: 19 U/L (ref 15–41)
Anion gap: 5 (ref 5–15)
BILIRUBIN TOTAL: 0.7 mg/dL (ref 0.3–1.2)
BUN: 13 mg/dL (ref 6–20)
CALCIUM: 9.4 mg/dL (ref 8.9–10.3)
CO2: 30 mmol/L (ref 22–32)
Chloride: 104 mmol/L (ref 101–111)
Creatinine, Ser: 0.61 mg/dL (ref 0.44–1.00)
GFR calc Af Amer: >60 mL/min (ref >60)
GFR calc non Af Amer: >60 mL/min (ref >60)
GLUCOSE: 94 mg/dL (ref 65–99)
Potassium: 4.4 mmol/L (ref 3.5–5.1)
Sodium: 139 mmol/L (ref 135–145)
TOTAL PROTEIN: 6.8 g/dL (ref 6.5–8.1)

## 2017-01-21 LAB — URINALYSIS, COMPLETE (UACMP) WITH MICROSCOPIC
BACTERIA UA: NONE SEEN
BILIRUBIN URINE: NEGATIVE
Glucose, UA: NEGATIVE mg/dL
Hgb urine dipstick: NEGATIVE
KETONES UR: NEGATIVE mg/dL
Leukocytes, UA: NEGATIVE
NITRITE: NEGATIVE
PROTEIN: NEGATIVE mg/dL
RBC / HPF: NONE SEEN RBC/hpf (ref 0–5)
SPECIFIC GRAVITY, URINE: 1.016 (ref 1.005–1.030)
SQUAMOUS EPITHELIAL / LPF: NONE SEEN
WBC UA: NONE SEEN WBC/hpf (ref 0–5)
pH: 7 (ref 5.0–8.0)

## 2017-01-21 LAB — LIPASE, BLOOD: Lipase: 32 U/L (ref 11–51)

## 2017-01-21 LAB — CBC
HCT: 40.9 % (ref 35.0–47.0)
Hemoglobin: 13.9 g/dL (ref 12.0–16.0)
MCH: 29.5 pg (ref 26.0–34.0)
MCHC: 33.9 g/dL (ref 32.0–36.0)
MCV: 87 fL (ref 80.0–100.0)
Platelets: 217 10*3/uL (ref 150–440)
RBC: 4.7 MIL/uL (ref 3.80–5.20)
RDW: 13.2 % (ref 11.5–14.5)
WBC: 5.3 10*3/uL (ref 3.6–11.0)

## 2017-01-21 LAB — POCT PREGNANCY, URINE: PREG TEST UR: NEGATIVE

## 2017-01-21 MED ORDER — IBUPROFEN 600 MG PO TABS: 600.0000 mg | ORAL_TABLET | Freq: Four times a day (QID) | ORAL | 0 refills | Status: DC | PRN

## 2017-01-21 MED ORDER — KETOROLAC TROMETHAMINE 30 MG/ML IJ SOLN
15.0000 mg | Freq: Once | INTRAMUSCULAR | Status: AC
  Administered 2017-01-21: 15 mg via INTRAMUSCULAR

## 2017-01-21 MED ORDER — KETOROLAC TROMETHAMINE 30 MG/ML IJ SOLN
15.0000 mg | Freq: Once | INTRAMUSCULAR | Status: DC
  Filled 2017-01-21: qty 1

## 2017-01-21 NOTE — ED Notes (Signed)
After receiving IM injection, pt states, "with the way I'm feeling I think it's going to have to come out if it's a cyst." Pt is noted to be hyper focused on having a hysterectomy, making multiple comments about having one to this RN and being denied one by her GYN.

## 2017-01-21 NOTE — ED Triage Notes (Signed)
Pt comes into the ED via EMS from home with c/o lower abd pain with loose stools for the past 2 weeks. Denies fever or vomiting.

## 2017-01-21 NOTE — Discharge Instructions (Signed)

## 2017-01-21 NOTE — ED Provider Notes (Signed)
[facility] Emergency Department Provider Note  ____________________________________________  Time seen: Approximately 12:47 PM  I have reviewed the triage vital signs and the nursing notes.   HISTORY  Chief Complaint Abdominal Pain   HPI Grace Norris is a 26 y.o. female with a history of endometriosis and ovarian cysts who presents for evaluation of left lower quadrant abdominal pain. Patient reports sudden onset of very sharp constant pain located in the left lower quadrant and nonradiating. Currently 10/10. No nausea or vomiting, no fever or chills, no vaginal bleeding.No dysuria hematuria. She does endorse some mucous discharge from her vagina. She denies any sexual activity over the course of the last 6 months.  Past Medical History:  Diagnosis Date  . Anemia   . Asthma    WELL CONTROLLED  . Chronic kidney disease    H/O STONES  . 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 Incidental finding noted on CT scan  . Endometriosis 03/19/2016  . Epilepsy ([place])   . Migraines   . Seizures ([place]) 2016   LAST SEIZURE 1-1.5 YRS AGO  . Sleep apnea    DOES NOT USE CPAP    Patient Active Problem List   Diagnosis Date Noted  . Endometriosis 03/19/2016  . Family history of cancer 10/12/2015  . Pelvic pain in female 10/12/2015    Past Surgical History:  Procedure Laterality Date  . HAND TENDON SURGERY Bilateral 11/2015  . LAPAROSCOPY N/A 03/09/2016   Procedure: LAPAROSCOPY DIAGNOSTIC and D AND C;  Surgeon: Rubie Maid, MD;  Location: [facility] ORS;  Service: Gynecology;  Laterality: N/A;    Prior to Admission medications   Medication Sig Start Date End Date Taking? Authorizing Provider  acetaminophen (TYLENOL 8 HOUR) 650 MG CR tablet Take 1 tablet (650 mg total) by mouth every 8 (eight) hours as needed for pain (mild). 03/09/16   Rubie Maid, MD  albuterol (PROAIR HFA) 108 (90 Base) MCG/ACT inhaler ProAir HFA 90 mcg/actuation aerosol  inhaler  INHALE 1 PUFF ORALLY EVERY 6 HOURS AS NEEDED FOR FOR SHORTNESS OF BREATH Ellie Lunch    [provider]  albuterol-ipratropium (COMBIVENT) 18-103 MCG/ACT inhaler Inhale 2 puffs into the lungs every 4 (four) hours as needed for wheezing or shortness of breath.    [provider]  azelastine (OPTIVAR) 0.05 % ophthalmic solution azelastine 0.05 % eye drops  INSTILL 1 DROP INTO BOTH EYES TWICE A DAY    [provider]  baclofen (LIORESAL) 10 MG tablet TAKE 1/2 TO 1 TABLET BY MOUTH EVERY 8 HOURS AS NEEDED FOR MUSCLE SPASM 06/20/16   [provider]  benzonatate (TESSALON PERLES) 100 MG capsule Take 1 capsule (100 mg total) by mouth 3 (three) times daily as needed. 12/09/16 12/09/17  Johnn Hai, PA-C  busPIRone (BUSPAR) 15 MG tablet buspirone 15 mg tablet    [provider]  butalbital-acetaminophen-caffeine (FIORICET, ESGIC) 50-325-40 MG tablet butalbital-acetaminophen-caffeine 50 mg-325 mg-40 mg tablet  TAKE 1 CAPSULE BY MOUTH EVERY 4 HOURS AS NEEDED FOR UP TO 3 DAYS    [provider]  diclofenac (CATAFLAM) 50 MG tablet TAKE 1 TABLET (50 MG TOTAL) BY MOUTH 2 (TWO) TIMES DAILY AS NEEDED. 10/05/16   [provider]  diclofenac (VOLTAREN) 75 MG EC tablet diclofenac sodium 75 mg tablet,delayed release    [provider]  docusate sodium (COLACE) 100 MG capsule Take 2 capsules (200 mg total) by mouth 2 (two) times daily. 10/19/16   Carrie Mew, MD  eletriptan (RELPAX) 40 MG tablet Relpax 40 mg tablet    [provider]  folic acid (FOLVITE) 1 MG tablet folic acid 1 mg tablet  TAKE 4 TABLETS BY MOUTH EVERY DAY    [provider]  HYDROcodone-acetaminophen (NORCO/VICODIN) 5-325 MG tablet Take 1 tablet by mouth every 6 (six) hours as needed. for pain 08/17/16   [provider]  ibuprofen (ADVIL,MOTRIN) 600 MG tablet Take 1 tablet (600 mg total) by mouth every 6 (six) hours as needed. 01/21/17    Rudene Re, MD  ketorolac (TORADOL) 10 MG tablet Take 1 tablet (10 mg total) by mouth every 6 (six) hours as needed. 10/20/16   Rubie Maid, MD  levETIRAcetam (KEPPRA) 750 MG tablet levetiracetam 750 mg tablet    [provider]  loteprednol (LOTEMAX) 0.2 % SUSP Apply to eye. 10/06/16   [provider]  Multiple Vitamin (MULTI-VITAMINS) TABS Take by mouth.    [provider]  norethindrone (AYGESTIN) 5 MG tablet norethindrone acetate 5 mg tablet    [provider]  nortriptyline (PAMELOR) 10 MG capsule Take by mouth. 06/02/16 06/02/17  [provider]  Olopatadine HCl (PATADAY) 0.2 % SOLN Pataday 0.2 % eye drops    [provider]  ondansetron (ZOFRAN-ODT) 4 MG disintegrating tablet ondansetron 4 mg disintegrating tablet    [provider]  oxyCODONE-acetaminophen (PERCOCET) 5-325 MG tablet Take 1-2 tablets by mouth every 6 (six) hours as needed. 10/16/16   Earleen Newport, MD  pantoprazole (PROTONIX) 40 MG tablet Take 1 tablet (40 mg total) by mouth daily. 09/05/16 09/05/17  Menshew, Dannielle Karvonen, PA-C  prednisoLONE acetate (PRED FORTE) 1 % ophthalmic suspension prednisolone acetate 1 % eye drops,suspension  1 DROP BOTH EYES 4X/DAY X 7 DAYS, 3X/DAY X 7 DAYS, 2X/DAY X 7 DAYS, 1X/DAY X 7 DAYS    [provider]  promethazine (PHENERGAN) 25 MG tablet Take by mouth. 10/05/16   [provider]  propranolol ER (INDERAL LA) 60 MG 24 hr capsule Take by mouth. 10/05/16 10/05/17  [provider]  ranitidine (ZANTAC) 150 MG tablet ranitidine 150 mg tablet  Take 1 tablet twice a day by oral route before meals.    [provider]  rizatriptan (MAXALT) 10 MG tablet rizatriptan 10 mg tablet    [provider]  senna (SENOKOT) 8.6 MG TABS tablet Take 2 tablets (17.2 mg total) by mouth 2 (two) times daily. 10/19/16   Carrie Mew, MD  simethicone (MYLICON) 80 MG chewable tablet Gas Relief 80 mg chewable  tablet  CHEW 1 TABLET (80 MG TOTAL) BY MOUTH EVERY 6 (SIX) HOURS AS NEEDED FOR FLATULENCE.    [provider]  traMADol (ULTRAM) 50 MG tablet Take 1 tablet (50 mg total) by mouth every 6 (six) hours as needed. 08/28/16 08/28/17  Lavonia Drafts, MD  traZODone (DESYREL) 50 MG tablet trazodone 50 mg tablet    [provider]  XIIDRA 5 % SOLN INSTILL 1 DROP INTO BOTH EYES TWICE A DAY 07/02/16   [provider]    Allergies Aspartame; Prednisone; Ibuprofen; Latex; and Penicillins  Family History  Problem Relation Age of Onset  . Scoliosis Brother   . Fibromyalgia Mother   . Cancer Mother 34       ovarian; s/p partial hysterectomy with RSO  . Graves' disease Mother   . Alzheimer's disease Other   . Cancer Maternal Grandmother 30       breast cancer (unilateral lumpectomy)  . Breast  cancer Maternal Grandmother     Social History Social History  Substance Use Topics  . Smoking status: Former Smoker    Packs/day: 0.50    Years: 1.50    Types: Cigarettes    Quit date: 03/05/2011  . Smokeless tobacco: Never Used  . Alcohol use Yes     Comment: occ    Review of Systems  Constitutional: Negative for fever. Eyes: Negative for visual changes. ENT: Negative for sore throat. Neck: No neck pain  Cardiovascular: Negative for chest pain. Respiratory: Negative for shortness of breath. Gastrointestinal: + LLQ abdominal pain. No vomiting or diarrhea. Genitourinary: Negative for dysuria. Musculoskeletal: Negative for back pain. Skin: Negative for rash. Neurological: Negative for headaches, weakness or numbness. Psych: No SI or HI  ____________________________________________   PHYSICAL EXAM:  VITAL SIGNS: ED Triage Vitals  Enc Vitals Group     BP 01/21/17 1049 (!) 111/59     Pulse Rate 01/21/17 1049 83     Resp 01/21/17 1049 16     Temp 01/21/17 1049 98.5 F (36.9 C)     Temp Source 01/21/17 1049 Oral     SpO2 01/21/17 1049 98 %     Weight 01/21/17 1044  135 lb (61.2 kg)     Height 01/21/17 1044 5\' 4"  (1.626 m)     Head Circumference --      Peak Flow --      Pain Score 01/21/17 1044 6     Pain Loc --      Pain Edu? --      Excl. in [place]? --     Constitutional: Alert and oriented. Well appearing and in no apparent distress. HEENT:      Head: Normocephalic and atraumatic.         Eyes: Conjunctivae are normal. Sclera is non-icteric.       Mouth/Throat: Mucous membranes are moist.       Neck: Supple with no signs of meningismus. Cardiovascular: Regular rate and rhythm. No murmurs, gallops, or rubs. 2+ symmetrical distal pulses are present in all extremities. No JVD. Respiratory: Normal respiratory effort. Lungs are clear to auscultation bilaterally. No wheezes, crackles, or rhonchi.  Gastrointestinal: Soft, ttp over the LLQ, and non distended with positive bowel sounds. No rebound or guarding. Musculoskeletal: Nontender with normal range of motion in all extremities. No edema, cyanosis, or erythema of extremities. Neurologic: Normal speech and language. Face is symmetric. Moving all extremities. No gross focal neurologic deficits are appreciated. Skin: Skin is warm, dry and intact. No rash noted. Psychiatric: Mood and affect are normal. Speech and behavior are normal.  ____________________________________________   LABS (all labs ordered are listed, but only abnormal results are displayed)  Labs Reviewed  URINALYSIS, COMPLETE (UACMP) WITH MICROSCOPIC - Abnormal; Notable for the following:       Result Value   Color, Urine AMBER (*)    APPearance TURBID (*)    All other components within normal limits  LIPASE, BLOOD  COMPREHENSIVE METABOLIC PANEL  CBC  POC URINE PREG, ED  POCT PREGNANCY, URINE   ____________________________________________  EKG  none  ____________________________________________  RADIOLOGY  TVUS:   ____________________________________________   PROCEDURES  Procedure(s) performed:  None Procedures Critical Care performed:  None ____________________________________________   INITIAL IMPRESSION / ASSESSMENT AND PLAN / ED COURSE  26 y.o. female with a history of endometriosis and ovarian cysts who presents for evaluation of sudden onset severe left lower quadrant abdominal pain since last night. Patient is well-appearing, in no  distress, is normal vital signs, she is tender to palpation on the left lower quadrant with no rebound or guarding. Recommended pelvic exam since patient is complaining of vaginal discharge however patient is refusing and tells me that she gets those frequently enough because of her history of endometriosis. I explained to her that the results from her last one 2 weeks ago are not valid for this current complaint since she had no pain at that time. Patient continues to refuse. Differential diagnoses including ovarian cyst, ovarian torsion, tubo-ovarian abscess, ectopic pregnancy, urinary tract infection, diverticulitis.Labs were no acute findings, pregnancy is negative. Patient was sent for transvaginal ultrasound.    _________________________ 3:24 PM on 01/21/2017 -----------------------------------------  Labs and TVUS with no acute findings. When I informed the patient about the results of her [place] and labs patient started to cry and told me all she wants is to see a Obgyn in the ED who will remove her uterus since her Obgyn will not do it. Patient saying "I am fucking tired of living with this pain for months and I need my fucking uterus to be removed". I offered to provide her with alternative Obgyn for a second opinion which she accepted it. I also discussed with her that since the pain has been an ongoing issue for months I do believe that risk of radiation associated with CT is higher than benefits at this time and recommended against further imaging at this time. Patient was instructed to f/u with her doctor in 12-24 hours for recheck. Discussed signs  and symptoms of torsion and appendicitis and recommended return if those develop.  Pertinent labs & imaging results that were available during my care of the patient were reviewed by me and considered in my medical decision making (see chart for details).    ____________________________________________   FINAL CLINICAL IMPRESSION(S) / ED DIAGNOSES  Final diagnoses:  LLQ abdominal pain      NEW MEDICATIONS STARTED DURING THIS VISIT:  New Prescriptions   IBUPROFEN (ADVIL,MOTRIN) 600 MG TABLET    Take 1 tablet (600 mg total) by mouth every 6 (six) hours as needed.     Note:  This document was prepared using Dragon voice recognition software and may include unintentional dictation errors.    Alfred Levins, [place], MD 01/21/17 [phone]

## 2017-01-21 NOTE — ED Notes (Signed)
Pt returned from US at this time.

## 2017-01-21 NOTE — ED Notes (Signed)
Pt tearful upon discharge, states, "I am tired of being in pain and my OB wont do anything for me."  Pt given referrals to two different OB doctors with discharge papers.  Pt states, "I will just see yall again in 5 hours" as she walks out of the room.

## 2017-01-21 NOTE — ED Notes (Signed)
Pt taken to US at this time

## 2017-01-22 ENCOUNTER — Encounter: Payer: Medicare Other | Admitting: Obstetrics and Gynecology

## 2017-01-22 ENCOUNTER — Ambulatory Visit (INDEPENDENT_AMBULATORY_CARE_PROVIDER_SITE_OTHER): Payer: Medicare Other | Admitting: Obstetrics and Gynecology

## 2017-01-22 VITALS — BP 100/68 | HR 80 | Ht 64.0 in | Wt 138.0 lb

## 2017-01-22 DIAGNOSIS — N809 Endometriosis, unspecified: Secondary | ICD-10-CM | POA: Diagnosis not present

## 2017-01-22 DIAGNOSIS — Z01419 Encounter for gynecological examination (general) (routine) without abnormal findings: Secondary | ICD-10-CM | POA: Diagnosis not present

## 2017-01-22 DIAGNOSIS — R102 Pelvic and perineal pain: Secondary | ICD-10-CM | POA: Diagnosis not present

## 2017-01-22 DIAGNOSIS — N83201 Unspecified ovarian cyst, right side: Secondary | ICD-10-CM

## 2017-01-22 MED ORDER — HYDROCODONE-ACETAMINOPHEN 5-325 MG PO TABS: 1.0000 | ORAL_TABLET | ORAL | 0 refills | Status: DC | PRN

## 2017-01-22 MED ORDER — POLYETHYLENE GLYCOL 3350 17 G PO PACK: 17.0000 g | PACK | Freq: Every day | ORAL | 0 refills | Status: DC

## 2017-01-22 NOTE — Patient Instructions (Signed)
Health Maintenance, Female Adopting a healthy lifestyle and getting preventive care can go a long way to promote health and wellness. Talk with your health care provider about what schedule of regular examinations is right for you. This is a good chance for you to check in with your provider about disease prevention and staying healthy. In between checkups, there are plenty of things you can do on your own. Experts have done a lot of research about which lifestyle changes and preventive measures are most likely to keep you healthy. Ask your health care provider for more information. Weight and diet Eat a healthy diet  Be sure to include plenty of vegetables, fruits, low-fat dairy products, and lean protein.  Do not eat a lot of foods high in solid fats, added sugars, or salt.  Get regular exercise. This is one of the most important things you can do for your health. ? Most adults should exercise for at least 150 minutes each week. The exercise should increase your heart rate and make you sweat (moderate-intensity exercise). ? Most adults should also do strengthening exercises at least twice a week. This is in addition to the moderate-intensity exercise.  Maintain a healthy weight  Body mass index (BMI) is a measurement that can be used to identify possible weight problems. It estimates body fat based on height and weight. Your health care provider can help determine your BMI and help you achieve or maintain a healthy weight.  For females 20 years of age and older: ? A BMI below 18.5 is considered underweight. ? A BMI of 18.5 to 24.9 is normal. ? A BMI of 25 to 29.9 is considered overweight. ? A BMI of 30 and above is considered obese.  Watch levels of cholesterol and blood lipids  You should start having your blood tested for lipids and cholesterol at 28 years of age, then have this test every 5 years.  You may need to have your cholesterol levels checked more often if: ? Your lipid or  cholesterol levels are high. ? You are older than 28 years of age. ? You are at high risk for heart disease.  Cancer screening Lung Cancer  Lung cancer screening is recommended for adults 55-80 years old who are at high risk for lung cancer because of a history of smoking.  A yearly low-dose CT scan of the lungs is recommended for people who: ? Currently smoke. ? Have quit within the past 15 years. ? Have at least a 30-pack-year history of smoking. A pack year is smoking an average of one pack of cigarettes a day for 1 year.  Yearly screening should continue until it has been 15 years since you quit.  Yearly screening should stop if you develop a health problem that would prevent you from having lung cancer treatment.  Breast Cancer  Practice breast self-awareness. This means understanding how your breasts normally appear and feel.  It also means doing regular breast self-exams. Let your health care provider know about any changes, no matter how small.  If you are in your 20s or 30s, you should have a clinical breast exam (CBE) by a health care provider every 1-3 years as part of a regular health exam.  If you are 40 or older, have a CBE every year. Also consider having a breast X-ray (mammogram) every year.  If you have a family history of breast cancer, talk to your health care provider about genetic screening.  If you are at high risk   for breast cancer, talk to your health care provider about having an MRI and a mammogram every year.  Breast cancer gene (BRCA) assessment is recommended for women who have family members with BRCA-related cancers. BRCA-related cancers include: ? Breast. ? Ovarian. ? Tubal. ? Peritoneal cancers.  Results of the assessment will determine the need for genetic counseling and BRCA1 and BRCA2 testing.  Cervical Cancer Your health care provider may recommend that you be screened regularly for cancer of the pelvic organs (ovaries, uterus, and  vagina). This screening involves a pelvic examination, including checking for microscopic changes to the surface of your cervix (Pap test). You may be encouraged to have this screening done every 3 years, beginning at age 22.  For women ages 56-65, health care providers may recommend pelvic exams and Pap testing every 3 years, or they may recommend the Pap and pelvic exam, combined with testing for human papilloma virus (HPV), every 5 years. Some types of HPV increase your risk of cervical cancer. Testing for HPV may also be done on women of any age with unclear Pap test results.  Other health care providers may not recommend any screening for nonpregnant women who are considered low risk for pelvic cancer and who do not have symptoms. Ask your health care provider if a screening pelvic exam is right for you.  If you have had past treatment for cervical cancer or a condition that could lead to cancer, you need Pap tests and screening for cancer for at least 20 years after your treatment. If Pap tests have been discontinued, your risk factors (such as having a new sexual partner) need to be reassessed to determine if screening should resume. Some women have medical problems that increase the chance of getting cervical cancer. In these cases, your health care provider may recommend more frequent screening and Pap tests.  Colorectal Cancer  This type of cancer can be detected and often prevented.  Routine colorectal cancer screening usually begins at 28 years of age and continues through 28 years of age.  Your health care provider may recommend screening at an earlier age if you have risk factors for colon cancer.  Your health care provider may also recommend using home test kits to check for hidden blood in the stool.  A small camera at the end of a tube can be used to examine your colon directly (sigmoidoscopy or colonoscopy). This is done to check for the earliest forms of colorectal  cancer.  Routine screening usually begins at age 33.  Direct examination of the colon should be repeated every 5-10 years through 28 years of age. However, you may need to be screened more often if early forms of precancerous polyps or small growths are found.  Skin Cancer  Check your skin from head to toe regularly.  Tell your health care provider about any new moles or changes in moles, especially if there is a change in a mole's shape or color.  Also tell your health care provider if you have a mole that is larger than the size of a pencil eraser.  Always use sunscreen. Apply sunscreen liberally and repeatedly throughout the day.  Protect yourself by wearing long sleeves, pants, a wide-brimmed hat, and sunglasses whenever you are outside.  Heart disease, diabetes, and high blood pressure  High blood pressure causes heart disease and increases the risk of stroke. High blood pressure is more likely to develop in: ? People who have blood pressure in the high end of  the normal range (130-139/85-89 mm Hg). ? People who are overweight or obese. ? People who are African American.  If you are 21-29 years of age, have your blood pressure checked every 3-5 years. If you are 3 years of age or older, have your blood pressure checked every year. You should have your blood pressure measured twice-once when you are at a hospital or clinic, and once when you are not at a hospital or clinic. Record the average of the two measurements. To check your blood pressure when you are not at a hospital or clinic, you can use: ? An automated blood pressure machine at a pharmacy. ? A home blood pressure monitor.  If you are between 17 years and 37 years old, ask your health care provider if you should take aspirin to prevent strokes.  Have regular diabetes screenings. This involves taking a blood sample to check your fasting blood sugar level. ? If you are at a normal weight and have a low risk for diabetes,  have this test once every three years after 28 years of age. ? If you are overweight and have a high risk for diabetes, consider being tested at a younger age or more often. Preventing infection Hepatitis B  If you have a higher risk for hepatitis B, you should be screened for this virus. You are considered at high risk for hepatitis B if: ? You were born in a country where hepatitis B is common. Ask your health care provider which countries are considered high risk. ? Your parents were born in a high-risk country, and you have not been immunized against hepatitis B (hepatitis B vaccine). ? You have HIV or AIDS. ? You use needles to inject street drugs. ? You live with someone who has hepatitis B. ? You have had sex with someone who has hepatitis B. ? You get hemodialysis treatment. ? You take certain medicines for conditions, including cancer, organ transplantation, and autoimmune conditions.  Hepatitis C  Blood testing is recommended for: ? Everyone born from [birth year] through 1967. ? Anyone with known risk factors for hepatitis C.  Sexually transmitted infections (STIs)  You should be screened for sexually transmitted infections (STIs) including gonorrhea and chlamydia if: ? You are sexually active and are younger than 28 years of age. ? You are older than 28 years of age and your health care provider tells you that you are at risk for this type of infection. ? Your sexual activity has changed since you were last screened and you are at an increased risk for chlamydia or gonorrhea. Ask your health care provider if you are at risk.  If you do not have HIV, but are at risk, it may be recommended that you take a prescription medicine daily to prevent HIV infection. This is called pre-exposure prophylaxis (PrEP). You are considered at risk if: ? You are sexually active and do not regularly use condoms or know the HIV status of your partner(s). ? You take drugs by injection. ? You are  sexually active with a partner who has HIV.  Talk with your health care provider about whether you are at high risk of being infected with HIV. If you choose to begin PrEP, you should first be tested for HIV. You should then be tested every 3 months for as long as you are taking PrEP. Pregnancy  If you are premenopausal and you may become pregnant, ask your health care provider about preconception counseling.  If you may become  pregnant, take 400 to 800 micrograms (mcg) of folic acid every day.  If you want to prevent pregnancy, talk to your health care provider about birth control (contraception). Osteoporosis and menopause  Osteoporosis is a disease in which the bones lose minerals and strength with aging. This can result in serious bone fractures. Your risk for osteoporosis can be identified using a bone density scan.  If you are 46 years of age or older, or if you are at risk for osteoporosis and fractures, ask your health care provider if you should be screened.  Ask your health care provider whether you should take a calcium or vitamin D supplement to lower your risk for osteoporosis.  Menopause may have certain physical symptoms and risks.  Hormone replacement therapy may reduce some of these symptoms and risks. Talk to your health care provider about whether hormone replacement therapy is right for you. Follow these instructions at home:  Schedule regular health, dental, and eye exams.  Stay current with your immunizations.  Do not use any tobacco products including cigarettes, chewing tobacco, or electronic cigarettes.  If you are pregnant, do not drink alcohol.  If you are breastfeeding, limit how much and how often you drink alcohol.  Limit alcohol intake to no more than 1 drink per day for nonpregnant women. One drink equals 12 ounces of beer, 5 ounces of wine, or 1 ounces of hard liquor.  Do not use street drugs.  Do not share needles.  Ask your health care  provider for help if you need support or information about quitting drugs.  Tell your health care provider if you often feel depressed.  Tell your health care provider if you have ever been abused or do not feel safe at home. This information is not intended to replace advice given to you by your health care provider. Make sure you discuss any questions you have with your health care provider. Document Released: 12/01/2010 Document Revised: 10/24/2015 Document Reviewed: 02/19/2015 [employer] Interactive Patient Education  2018 [employer]  Pelvic Pain, Female Pelvic pain is pain in your lower abdomen, below your belly button and between your hips. The pain may start suddenly (acute), keep coming back (recurring), or last a long time (chronic). Pelvic pain that lasts longer than six months is considered chronic. Pelvic pain may affect your:  Reproductive organs.  Urinary system.  Digestive tract.  Musculoskeletal system.  There are many potential causes of pelvic pain. Sometimes, the pain can be a result of digestive or urinary conditions, strained muscles or ligaments, or even reproductive conditions. Sometimes the cause of pelvic pain is not known. Follow these instructions at home:  Take over-the-counter and prescription medicines only as told by your health care provider.  Rest as told by your health care provider.  Do not have sex it if hurts.  Keep a journal of your pelvic pain. Write down: ? When the pain started. ? Where the pain is located. ? What seems to make the pain better or worse, such as food or your menstrual cycle. ? Any symptoms you have along with the pain.  Keep all follow-up visits as told by your health care provider. This is important. Contact a health care provider if:  Medicine does not help your pain.  Your pain comes back.  You have new symptoms.  You have abnormal vaginal discharge or bleeding, including bleeding after menopause.  You have a  fever or chills.  You are constipated.  You have blood in your  urine or stool.  You have foul-smelling urine.  You feel weak or lightheaded. Get help right away if:  You have sudden severe pain.  Your pain gets steadily worse.  You have severe pain along with fever, nausea, vomiting, or excessive sweating.  You lose consciousness. This information is not intended to replace advice given to you by your health care provider. Make sure you discuss any questions you have with your health care provider. Document Released: 04/14/2004 Document Revised: 06/12/2015 Document Reviewed: 03/08/2015 [employer] Interactive Patient Education  2018 [employer].

## 2017-01-22 NOTE — Progress Notes (Signed)
GYNECOLOGY ANNUAL PHYSICAL EXAM PROGRESS NOTE  Subjective:    Grace Norris is a 26 y.o. [id number] female with a PMH of endometriosis who presents for an annual exam. The patient is not currently sexually active (states due to discomfort). The patient wears seatbelts: yes. The patient participates in regular exercise: no. Has the patient ever been transfused or tattooed?: no. The patient reports that there is not domestic violence in her life.    The patient has the following complaints today:  1. Patient states that she was seen in the Emergency Room yesterday due to complaints of pelvic pain. Had an ultrasound performed.  States that she is tired of experiencing pelvic pain due to ovarian cysts and endometriosis.  Notes that she just wants a hysterectomy.     Gynecologic History Patient's last menstrual period was 12/26/2016 (exact date). Menstrual History: OB History    Gravida Para Term Preterm AB Living   1 0 0 0 1 0   SAB TAB Ectopic Multiple Live Births   1 0 0 0        Menarche age: 25 Patient's last menstrual period was 12/26/2016 (exact date). Contraception: none History of STI's: Denies Last Pap: 01/16/2016. Results were: normal.  Denies h/o abnormal pap smears.   Obstetric History   G1   P0   T0   P0   A1   L0    SAB1   TAB0   Ectopic0   Multiple0   Live Births0     # Outcome Date GA Lbr Len/2nd Weight Sex Delivery Anes PTL Lv  1 SAB 2016              Past Medical History:  Diagnosis Date  . Anemia   . Asthma    WELL CONTROLLED  . Chronic kidney disease    H/O STONES  . 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 Incidental finding noted on CT scan  . Endometriosis 03/19/2016  . Epilepsy ([place])   . Migraines   . Seizures ([place]) 2016   LAST SEIZURE 1-1.5 YRS AGO  . Sleep apnea    DOES NOT USE CPAP    Past Surgical History:  Procedure Laterality Date  . HAND TENDON SURGERY Bilateral 11/2015  . LAPAROSCOPY N/A 03/09/2016   Procedure: LAPAROSCOPY DIAGNOSTIC and D AND C;  Surgeon: Rubie Maid, MD;  Location: [facility] ORS;  Service: Gynecology;  Laterality: N/A;    Family History  Problem Relation Age of Onset  . Scoliosis Brother   . Fibromyalgia Mother   . Cancer Mother 22       ovarian; s/p partial hysterectomy with RSO  . Graves' disease Mother   . Alzheimer's disease Other   . Cancer Maternal Grandmother 30       breast cancer (unilateral lumpectomy)  . Breast cancer Maternal Grandmother     Social History   Social History  . Marital status: Single    Spouse name: N/A  . Number of children: N/A  . Years of education: N/A   Occupational History  . Not on file.   Social History Main Topics  . Smoking status: Former Smoker    Packs/day: 0.50    Years: 1.50    Types: Cigarettes    Quit date: 03/05/2011  . Smokeless tobacco: Never Used  . Alcohol use Yes     Comment: occ  . Drug use: No  . Sexual activity: Yes    Birth control/ protection: Injection  Comment: Depo Provera started 09/03/2016   Other Topics Concern  . Not on file   Social History Narrative  . No narrative on file    Current Outpatient Prescriptions on File Prior to Visit  Medication Sig Dispense Refill  . acetaminophen (TYLENOL 8 HOUR) 650 MG CR tablet Take 1 tablet (650 mg total) by mouth every 8 (eight) hours as needed for pain (mild). 30 tablet 1  . albuterol (PROAIR HFA) 108 (90 Base) MCG/ACT inhaler ProAir HFA 90 mcg/actuation aerosol inhaler  INHALE 1 PUFF ORALLY EVERY 6 HOURS AS NEEDED FOR FOR SHORTNESS OF BREATH Ellie Lunch    . azelastine (OPTIVAR) 0.05 % ophthalmic solution azelastine 0.05 % eye drops  INSTILL 1 DROP INTO BOTH EYES TWICE A DAY    . baclofen (LIORESAL) 10 MG tablet TAKE 1/2 TO 1 TABLET BY MOUTH EVERY 8 HOURS AS NEEDED FOR MUSCLE SPASM  0  . busPIRone (BUSPAR) 15 MG tablet buspirone 15 mg tablet    . butalbital-acetaminophen-caffeine (FIORICET, ESGIC) 50-325-40 MG tablet  butalbital-acetaminophen-caffeine 50 mg-325 mg-40 mg tablet  TAKE 1 CAPSULE BY MOUTH EVERY 4 HOURS AS NEEDED FOR UP TO 3 DAYS    . diclofenac (CATAFLAM) 50 MG tablet TAKE 1 TABLET (50 MG TOTAL) BY MOUTH 2 (TWO) TIMES DAILY AS NEEDED.  0  . diclofenac (VOLTAREN) 75 MG EC tablet diclofenac sodium 75 mg tablet,delayed release    . docusate sodium (COLACE) 100 MG capsule Take 2 capsules (200 mg total) by mouth 2 (two) times daily. 120 capsule 0  . eletriptan (RELPAX) 40 MG tablet Relpax 40 mg tablet    . folic acid (FOLVITE) 1 MG tablet folic acid 1 mg tablet  TAKE 4 TABLETS BY MOUTH EVERY DAY    . ibuprofen (ADVIL,MOTRIN) 600 MG tablet Take 1 tablet (600 mg total) by mouth every 6 (six) hours as needed. 20 tablet 0  . ketorolac (TORADOL) 10 MG tablet Take 1 tablet (10 mg total) by mouth every 6 (six) hours as needed. 20 tablet 0  . levETIRAcetam (KEPPRA) 750 MG tablet levetiracetam 750 mg tablet    . loteprednol (LOTEMAX) 0.2 % SUSP Apply to eye.    . Multiple Vitamin (MULTI-VITAMINS) TABS Take by mouth.    . norethindrone (AYGESTIN) 5 MG tablet norethindrone acetate 5 mg tablet    . nortriptyline (PAMELOR) 10 MG capsule Take by mouth.    . Olopatadine HCl (PATADAY) 0.2 % SOLN Pataday 0.2 % eye drops    . pantoprazole (PROTONIX) 40 MG tablet Take 1 tablet (40 mg total) by mouth daily. 10 tablet 0  . prednisoLONE acetate (PRED FORTE) 1 % ophthalmic suspension prednisolone acetate 1 % eye drops,suspension  1 DROP BOTH EYES 4X/DAY X 7 DAYS, 3X/DAY X 7 DAYS, 2X/DAY X 7 DAYS, 1X/DAY X 7 DAYS    . promethazine (PHENERGAN) 25 MG tablet Take by mouth.    . propranolol ER (INDERAL LA) 60 MG 24 hr capsule Take by mouth.    . ranitidine (ZANTAC) 150 MG tablet ranitidine 150 mg tablet  Take 1 tablet twice a day by oral route before meals.    . rizatriptan (MAXALT) 10 MG tablet rizatriptan 10 mg tablet    . senna (SENOKOT) 8.6 MG TABS tablet Take 2 tablets (17.2 mg total) by mouth 2 (two) times daily. 120  each 0  . simethicone (MYLICON) 80 MG chewable tablet Gas Relief 80 mg chewable tablet  CHEW 1 TABLET (80 MG TOTAL) BY MOUTH EVERY 6 (SIX) HOURS AS  NEEDED FOR FLATULENCE.    . traZODone (DESYREL) 50 MG tablet trazodone 50 mg tablet    . XIIDRA 5 % SOLN INSTILL 1 DROP INTO BOTH EYES TWICE A DAY  3   No current facility-administered medications on file prior to visit.     Allergies  Allergen Reactions  . Aspartame Other (See Comments)    Other reaction(s): Other (See Comments) Seizures Seizures Other reaction(s): Other (See Comments) Seizures  . Prednisone Nausea And Vomiting    Other reaction(s): Vomiting Other reaction(s): Vomiting  . Ibuprofen Nausea And Vomiting and Rash    Other reaction(s): Nausea And Vomiting  . Latex Rash  . Lavender Oil Rash  . Penicillins Other (See Comments)    Other reaction(s): Other (See Comments) Patient reports seizures Other reaction(s): Other (See Comments) Seizures Seizures Unknown if it caused rash,swelling, shortness of breath or skin necrosis. Patient says she has not had a reaction in past ten years. Has not taken Penicillin since diagnosed with epilepsy.      Review of Systems Constitutional: negative for chills, fatigue, fevers and sweats Eyes: negative for irritation, redness and visual disturbance Ears, nose, mouth, throat, and face: negative for hearing loss, nasal congestion, snoring and tinnitus Respiratory: negative for asthma, cough, sputum Cardiovascular: negative for chest pain, dyspnea, exertional chest pressure/discomfort, irregular heart beat, palpitations and syncope Gastrointestinal: negative for abdominal pain, change in bowel habits, nausea and vomiting Genitourinary: positive for pelvic pain, severe dysmenorrhea, and  sexual problems (superficial and deep dyspareunia).  Negative for abnormal menstrual periods, genital lesions,and vaginal discharge, dysuria and urinary incontinence Integument/breast: negative for  breast lump, breast tenderness and nipple discharge Hematologic/lymphatic: negative for bleeding and easy bruising Musculoskeletal:negative for back pain and muscle weakness Neurological: negative for dizziness, headaches, vertigo and weakness Endocrine: negative for diabetic symptoms including polydipsia, polyuria and skin dryness Allergic/Immunologic: negative for hay fever and urticaria        Objective:  Blood pressure 100/68, pulse 80, height 5\' 4"  (1.626 m), weight 138 lb (62.6 kg), last menstrual period 12/26/2016. Body mass index is 23.69 kg/m.   General Appearance:    Alert, cooperative, no distress, appears stated age  Head:    Normocephalic, without obvious abnormality, atraumatic  Eyes:    PERRL, conjunctiva/corneas clear, EOM's intact, both eyes  Ears:    Normal external ear canals, both ears  Nose:   Nares normal, septum midline, mucosa normal, no drainage or sinus tenderness  Throat:   Lips, mucosa, and tongue normal; teeth and gums normal  Neck:   Supple, symmetrical, trachea midline, no adenopathy; thyroid: no enlargement/tenderness/nodules; no carotid bruit or JVD  Back:     Symmetric, no curvature, ROM normal, no CVA tenderness  Lungs:     Clear to auscultation bilaterally, respirations unlabored  Chest Wall:    No tenderness or deformity   Heart:    Regular rate and rhythm, S1 and S2 normal, no murmur, rub or gallop  Breast Exam:    No tenderness, masses, or nipple abnormality  Abdomen:     Soft, non-tender, bowel sounds active all four quadrants, no masses, no organomegaly.    Genitalia:    Pelvic:external genitalia normal, vagina with scant white discharge, no odor.  No vaginal lesions.  Tenderness at introitus with bimanual exam. Rectovaginal septum  normal. Cervix normal in appearance, no cervical motion tenderness, no palpable adnexal masses but mild tenderness on right.  Uterus normal size, shape, mobile, regular contours, nontender.  Rectal:    Normal external  sphincter.  No hemorrhoids appreciated. Internal exam not done.   Extremities:   Extremities normal, atraumatic, no cyanosis or edema  Pulses:   2+ and symmetric all extremities  Skin:   Skin color, texture, turgor normal, no rashes or lesions  Lymph nodes:   Cervical, supraclavicular, and axillary nodes normal  Neurologic:   CNII-XII intact, normal strength, sensation and reflexes throughout   .  Labs:  Lab Results  Component Value Date   WBC 5.3 01/21/2017   HGB 13.9 01/21/2017   HCT 40.9 01/21/2017   MCV 87.0 01/21/2017   PLT 217 01/21/2017    Lab Results  Component Value Date   CREATININE 0.61 01/21/2017   BUN 13 01/21/2017   NA 139 01/21/2017   K 4.4 01/21/2017   CL 104 01/21/2017   CO2 30 01/21/2017    Lab Results  Component Value Date   ALT 23 01/21/2017   AST 19 01/21/2017   ALKPHOS 42 01/21/2017   BILITOT 0.7 01/21/2017    No results found for: TSH    Imaging:  CLINICAL DATA:  Left lower quadrant pain for 5 days.  Endometriosis.  EXAM: TRANSABDOMINAL AND TRANSVAGINAL ULTRASOUND OF PELVIS  DOPPLER ULTRASOUND OF OVARIES  TECHNIQUE: Both transabdominal and transvaginal ultrasound examinations of the pelvis were performed. Transabdominal technique was performed for global imaging of the pelvis including uterus, ovaries, adnexal regions, and pelvic cul-de-sac.  It was necessary to proceed with endovaginal exam following the transabdominal exam to visualize the right adnexal cystic lesion. Color and duplex Doppler ultrasound was utilized to evaluate blood flow to the ovaries.  COMPARISON:  12/16/2016  FINDINGS: Uterus  Measurements: 6.0 x 3.5 x 4.6 cm. Retroflexed. No fibroids or other mass visualized.  Endometrium  Thickness: 5 mm.  No focal abnormality visualized.  Right ovary  Measurements: 4.7 x 3.1 x 3.7 cm. A 3.5 cm ovoid cystic lesion with a few thin internal septations is seen, without significant change in size since  previous study. An adjacent simple cyst is seen measuring 2.3 cm which appears new since previous study.  Left ovary  Measurements: 2.6 x 1.3 x 1.3 cm. Normal appearance/no adnexal mass.  Pulsed Doppler evaluation of both ovaries demonstrates normal low-resistance arterial and venous waveforms.  Other findings  Tiny amount of free fluid in cul-de-sac.  IMPRESSION: 3.5 cm indeterminate but probably benign cystic lesion in right ovary shows no significant change. New adjacent simple appearing cyst measuring 2.3 cm. Recommend continued followup by transvaginal ultrasound in 3 months.  No sonographic evidence for ovarian torsion.  Normal appearance of uterus and left ovary.  Assessment:   Routine gynecologic exam Endometriosis Pelvic pain Ovarian cyst  Plan:     Blood tests: None.  Up to date (labs done in ER yesterday). Breast self exam technique reviewed and patient encouraged to perform self-exam monthly. Contraception: none and patient notes that her Neurologist states that she cannot be on any form of hormonal contraception (including progesterone only).  Discussed condom use, possible copper IUD (however patient declines due to pain from Mirena IUD she had in the past). Discussed healthy lifestyle modifications.   Pap smear up to date. Next due in 2020.  Endometriosis with persistent small right ovarian cyst.  Attempted patient on several regimens (continuous and cyclic OCPs, as well as Mirena IUD (kept for ~ 4-5 months but removed due to pain), and Depo Provera (tried for ~ 3-6 months but had breakthrough bleeding)).  Patient was sent to pelvic floor physical therapy, had  1st appointment this week, but note that she is was in pain and did not desire to participate at that time as it was making her pain worse.  Patient continues to note that she desires hysterectomy as her Neurologist has now told her that she should not get pregnant.  Told her she she not try Lupron.  Will refer to [facility] pelvic pain/endometriosis specialist to discuss if other viable options possible.  If recommendations are for hysterectomy, then will schedule.  Will prescribe pain medications until appointment.     Rubie Maid, MD [facility]

## 2017-01-24 DIAGNOSIS — N83201 Unspecified ovarian cyst, right side: Secondary | ICD-10-CM | POA: Insufficient documentation

## 2017-01-25 ENCOUNTER — Encounter: Payer: Self-pay | Admitting: Obstetrics and Gynecology

## 2017-01-27 ENCOUNTER — Telehealth: Payer: Self-pay

## 2017-01-27 DIAGNOSIS — K5904 Chronic idiopathic constipation: Secondary | ICD-10-CM

## 2017-01-27 MED ORDER — POLYETHYLENE GLYCOL 3350 17 G PO PACK: 17.0000 g | PACK | Freq: Every day | ORAL | 6 refills | Status: DC

## 2017-01-27 NOTE — Telephone Encounter (Signed)
Called pt informed her that Dr.Cherry does not recommend that we do a provera challenge as she suffers from severe endometriosis and this will only cause pain to increase. Pt feels as though she needs to have a period due to bloating. Advised pt on diuretic such as Pamprin or Midol for bloating. Pt gave verbal understanding. Also advised on the need for bowel movements, pt requests rx for miralax. RX given.

## 2017-01-29 ENCOUNTER — Emergency Department
Admission: EM | Admit: 2017-01-29 | Discharge: 2017-01-29 | Disposition: A | Discharge status: Home / Self Care | Payer: Medicare Other | Attending: Emergency Medicine | Admitting: Emergency Medicine

## 2017-01-29 DIAGNOSIS — K529 Noninfective gastroenteritis and colitis, unspecified: Secondary | ICD-10-CM

## 2017-01-29 DIAGNOSIS — J45909 Unspecified asthma, uncomplicated: Secondary | ICD-10-CM | POA: Diagnosis not present

## 2017-01-29 DIAGNOSIS — Z91040 Latex allergy status: Secondary | ICD-10-CM | POA: Insufficient documentation

## 2017-01-29 DIAGNOSIS — R102 Pelvic and perineal pain: Secondary | ICD-10-CM

## 2017-01-29 DIAGNOSIS — Z87891 Personal history of nicotine dependence: Secondary | ICD-10-CM | POA: Diagnosis not present

## 2017-01-29 DIAGNOSIS — R112 Nausea with vomiting, unspecified: Secondary | ICD-10-CM | POA: Insufficient documentation

## 2017-01-29 DIAGNOSIS — R1031 Right lower quadrant pain: Secondary | ICD-10-CM | POA: Diagnosis not present

## 2017-01-29 LAB — COMPREHENSIVE METABOLIC PANEL
ALBUMIN: 4.3 g/dL (ref 3.5–5.0)
ALK PHOS: 42 U/L (ref 38–126)
ALT: 31 U/L (ref 14–54)
AST: 24 U/L (ref 15–41)
Anion gap: 6 (ref 5–15)
BUN: 16 mg/dL (ref 6–20)
CALCIUM: 9.2 mg/dL (ref 8.9–10.3)
CHLORIDE: 105 mmol/L (ref 101–111)
CO2: 26 mmol/L (ref 22–32)
CREATININE: 0.5 mg/dL (ref 0.44–1.00)
GFR calc non Af Amer: >60 mL/min (ref >60)
GLUCOSE: 100 mg/dL — AB (ref 65–99)
Potassium: 4 mmol/L (ref 3.5–5.1)
SODIUM: 137 mmol/L (ref 135–145)
Total Bilirubin: 0.5 mg/dL (ref 0.3–1.2)
Total Protein: 7 g/dL (ref 6.5–8.1)

## 2017-01-29 LAB — CBC
HCT: 40.8 % (ref 35.0–47.0)
Hemoglobin: 13.9 g/dL (ref 12.0–16.0)
MCH: 29.5 pg (ref 26.0–34.0)
MCHC: 34.1 g/dL (ref 32.0–36.0)
MCV: 86.5 fL (ref 80.0–100.0)
PLATELETS: 197 10*3/uL (ref 150–440)
RBC: 4.72 MIL/uL (ref 3.80–5.20)
RDW: 12.7 % (ref 11.5–14.5)
WBC: 5.9 10*3/uL (ref 3.6–11.0)

## 2017-01-29 LAB — LIPASE, BLOOD: LIPASE: 23 U/L (ref 11–51)

## 2017-01-29 MED ORDER — HYDROMORPHONE HCL 1 MG/ML IJ SOLN
0.5000 mg | Freq: Once | INTRAMUSCULAR | Status: AC
  Administered 2017-01-29: 0.5 mg via INTRAVENOUS
  Filled 2017-01-29: qty 1

## 2017-01-29 MED ORDER — PROMETHAZINE HCL 25 MG/ML IJ SOLN
25.0000 mg | Freq: Once | INTRAMUSCULAR | Status: AC
  Administered 2017-01-29: 25 mg via INTRAVENOUS
  Filled 2017-01-29: qty 1

## 2017-01-29 MED ORDER — PROMETHAZINE HCL 25 MG PO TABS: 25.0000 mg | ORAL_TABLET | ORAL | 1 refills | Status: DC | PRN

## 2017-01-29 MED ORDER — ONDANSETRON HCL 4 MG/2ML IJ SOLN
4.0000 mg | Freq: Once | INTRAMUSCULAR | Status: AC
  Administered 2017-01-29: 4 mg via INTRAVENOUS
  Filled 2017-01-29: qty 2

## 2017-01-29 MED ORDER — SODIUM CHLORIDE 0.9 % IV SOLN
Freq: Once | INTRAVENOUS | Status: AC
Start: 2017-01-29 — End: 2017-01-29
  Administered 2017-01-29: 14:00:00 via INTRAVENOUS

## 2017-01-29 MED ORDER — MORPHINE SULFATE (PF) 4 MG/ML IV SOLN
4.0000 mg | Freq: Once | INTRAVENOUS | Status: AC
  Administered 2017-01-29: 4 mg via INTRAVENOUS
  Filled 2017-01-29: qty 1

## 2017-01-29 NOTE — ED Notes (Signed)
Patient on phone with friend to get ride home. Given something to drink per Dr. Jimmye Norman.

## 2017-01-29 NOTE — ED Notes (Signed)
Pt told this tech and laura, rn she wanted [place] to shut the door of room 5, stating that she didn't want to her his singing, explained that we weren't able to at this time,  This tech retrieved partition per charge rn to provide 5hall pt more privacy

## 2017-01-29 NOTE — ED Triage Notes (Signed)
Patient arrives to [facility] ED via POV with complaint of lower right sided abdominal pain since yesterday. Patient states she has been nauseated with associated vomiting, denies diarrhea

## 2017-01-29 NOTE — ED Provider Notes (Signed)
[facility] Emergency Department Provider Note       Time seen: ----------------------------------------- 12:58 PM on 01/29/2017 -----------------------------------------     I have reviewed the triage vital signs and the nursing notes.   HISTORY   Chief Complaint Emesis    HPI Grace Norris is a 26 y.o. female who presents to the ED for abdominal pain since yesterday. Patient also has associated nausea with vomiting and looser stools. Patient states she does typically describe it as diarrhea but it has not been Rosana Hoes formed as normal. She was recently seen for an ovarian cyst and is concerned he may have gotten worse. Pain is 5 out of 10 in the right lower abdomen.   Past Medical History:  Diagnosis Date  . Anemia   . Asthma    WELL CONTROLLED  . Chronic kidney disease    H/O STONES  . 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 Incidental finding noted on CT scan  . Endometriosis 03/19/2016  . Epilepsy ([place])   . Migraines   . Seizures ([place]) 2016   LAST SEIZURE 1-1.5 YRS AGO  . Sleep apnea    DOES NOT USE CPAP    Patient Active Problem List   Diagnosis Date Noted  . Right ovarian cyst 01/24/2017  . 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 Left carpal tunnel syndrome 05/14/2016  . Endometriosis 03/19/2016  . Bilateral hand numbness 03/02/2016  . De Quervain's tenosynovitis, bilateral 01/11/2016  . Family history of cancer 10/12/2015  . Pelvic pain in female 10/12/2015    Past Surgical History:  Procedure Laterality Date  . HAND TENDON SURGERY Bilateral 11/2015  . LAPAROSCOPY N/A 03/09/2016   Procedure: LAPAROSCOPY DIAGNOSTIC and D AND C;  Surgeon: Rubie Maid, MD;  Location: [facility] ORS;  Service: Gynecology;  Laterality: N/A;    Allergies Aspartame; Prednisone; Ibuprofen; Latex; Lavender oil; and Penicillins  Social History Social History  Substance Use Topics  .  Smoking status: Former Smoker    Packs/day: 0.50    Years: 1.50    Types: Cigarettes    Quit date: 03/05/2011  . Smokeless tobacco: Never Used  . Alcohol use Yes     Comment: occ    Review of Systems Constitutional: Negative for fever. Eyes: Negative for vision changes ENT:  Negative for congestion, sore throat Cardiovascular: Negative for chest pain. Respiratory: Negative for shortness of breath. Gastrointestinal: Positive for abdominal pain, vomiting and diarrhea Genitourinary: Negative for dysuria. Musculoskeletal: Negative for back pain. Skin: Negative for rash. Neurological: Negative for headaches, focal weakness or numbness.  All systems negative/normal/unremarkable except as stated in the HPI  ____________________________________________   PHYSICAL EXAM:  VITAL SIGNS: ED Triage Vitals  Enc Vitals Group     BP 01/29/17 1059 114/68     Pulse Rate 01/29/17 1059 88     Resp 01/29/17 1059 16     Temp 01/29/17 1059 98.6 F (37 C)     Temp Source 01/29/17 1059 Oral     SpO2 01/29/17 1059 96 %     Weight 01/29/17 1111 138 lb (62.6 kg)     Height 01/29/17 1111 5\' 4"  (1.626 m)     Head Circumference --      Peak Flow --      Pain Score 01/29/17 1058 5     Pain Loc --      Pain Edu? --      Excl. in [place]? --  Constitutional: Alert and oriented. Well appearing and in no distress. Eyes: Conjunctivae are normal. Normal extraocular movements. ENT   Head: Normocephalic and atraumatic.   Nose: No congestion/rhinnorhea.   Mouth/Throat: Mucous membranes are moist.   Neck: No stridor. Cardiovascular: Normal rate, regular rhythm. No murmurs, rubs, or gallops. Respiratory: Normal respiratory effort without tachypnea nor retractions. Breath sounds are clear and equal bilaterally. No wheezes/rales/rhonchi. Gastrointestinal: Nonfocal abdominal tenderness, no rebound or guarding. Normal bowel sounds. Musculoskeletal: Nontender with normal range of motion in  extremities. No lower extremity tenderness nor edema. Neurologic:  Normal speech and language. No gross focal neurologic deficits are appreciated.  Skin:  Skin is warm, dry and intact. No rash noted. Psychiatric: Mood and affect are normal. Speech and behavior are normal.  ____________________________________________  ED COURSE:  Pertinent labs & imaging results that were available during my care of the patient were reviewed by me and considered in my medical decision making (see chart for details). Patient presents for abdominal pain with vomiting and diarrhea, we will assess with labs and imaging as indicated.   Procedures ____________________________________________   LABS (pertinent positives/negatives)  Labs Reviewed  COMPREHENSIVE METABOLIC PANEL - Abnormal; Notable for the following:       Result Value   Glucose, Bld 100 (*)    All other components within normal limits  LIPASE, BLOOD  CBC  URINALYSIS, COMPLETE (UACMP) WITH MICROSCOPIC  POC URINE PREG, ED  ____________________________________________  FINAL ASSESSMENT AND PLAN  Abdominal pain, vomiting  Plan: Patient's labs and imaging were dictated above. Patient had presented for Abdominal pain and vomiting which is multifactorial. She likely has gastroenteritis on top of chronic pelvic pain from ovarian cysts and endometriosis. Patient states the Vicodin she is taking at home is not alleviating her pain any longer. Advise she have close follow-up with her GYN doctor.   Earleen Newport, MD   Note: This note was generated in part or whole with voice recognition software. Voice recognition is usually quite accurate but there are transcription errors that can and very often do occur. I apologize for any typographical errors that were not detected and corrected.     Earleen Newport, MD 01/29/17 [phone]

## 2017-02-05 ENCOUNTER — Encounter: Payer: Medicare Other | Admitting: Obstetrics and Gynecology

## 2017-02-09 ENCOUNTER — Encounter: Payer: Self-pay | Admitting: Obstetrics and Gynecology

## 2017-03-31 HISTORY — PX: LAPAROSCOPIC APPENDECTOMY: SUR753

## 2017-09-05 DIAGNOSIS — F411 Generalized anxiety disorder: Secondary | ICD-10-CM | POA: Insufficient documentation

## 2017-10-14 HISTORY — PX: LAPAROSCOPIC CHOLECYSTECTOMY: SUR755

## 2017-11-10 ENCOUNTER — Encounter: Payer: Self-pay | Admitting: Emergency Medicine

## 2017-11-10 ENCOUNTER — Emergency Department
Admission: EM | Admit: 2017-11-10 | Discharge: 2017-11-10 | Disposition: A | Discharge status: Home / Self Care | Payer: Medicare Other | Attending: Emergency Medicine | Admitting: Emergency Medicine

## 2017-11-10 DIAGNOSIS — M722 Plantar fascial fibromatosis: Secondary | ICD-10-CM | POA: Diagnosis not present

## 2017-11-10 DIAGNOSIS — Z79899 Other long term (current) drug therapy: Secondary | ICD-10-CM | POA: Insufficient documentation

## 2017-11-10 DIAGNOSIS — M79672 Pain in left foot: Secondary | ICD-10-CM | POA: Insufficient documentation

## 2017-11-10 DIAGNOSIS — Z87891 Personal history of nicotine dependence: Secondary | ICD-10-CM | POA: Insufficient documentation

## 2017-11-10 DIAGNOSIS — M79671 Pain in right foot: Secondary | ICD-10-CM | POA: Diagnosis present

## 2017-11-10 DIAGNOSIS — J45909 Unspecified asthma, uncomplicated: Secondary | ICD-10-CM | POA: Insufficient documentation

## 2017-11-10 DIAGNOSIS — Z91040 Latex allergy status: Secondary | ICD-10-CM | POA: Insufficient documentation

## 2017-11-10 MED ORDER — DEXAMETHASONE 0.5 MG PO TABS: 0.5000 mg | ORAL_TABLET | Freq: Two times a day (BID) | ORAL | 0 refills | Status: AC

## 2017-11-10 MED ORDER — ONDANSETRON HCL 4 MG PO TABS: 4.0000 mg | ORAL_TABLET | Freq: Three times a day (TID) | ORAL | 0 refills | Status: AC | PRN

## 2017-11-10 NOTE — ED Triage Notes (Signed)
Pt arrived with complaints of bilateral foot pain. Pt was diagnosed with plantar fascitis and sent to therapy which she has now completed. Pt denies any new complaints but states the pain is too bad to put any weight on.

## 2017-11-10 NOTE — ED Provider Notes (Signed)
[facility] Emergency Department Provider Note  ____________________________________________  Time seen: Approximately 9:15 PM  I have reviewed the triage vital signs and the nursing notes.   HISTORY  Chief Complaint Foot Pain    HPI Grace Norris is a 27 y.o. female presents to the emergency department with bilateral foot pain.  Patient has been previously diagnosed with plantar fasciitis by podiatry.  Patient reports that she has been applying ice at home and has tried arch supports.  Patient reports that podiatry says they can "do nothing for her".  Patient's pain is worsened with ambulation first thing in the morning and relieved with rest.  She currently rates her pain at 10 out of 10 in intensity.  Pain is in the distribution of the plantar fascia.   Past Medical History:  Diagnosis Date  . Anemia   . Asthma    WELL CONTROLLED  . Chronic kidney disease    H/O STONES  . 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 Incidental finding noted on CT scan  . Endometriosis 03/19/2016  . Epilepsy ([place])   . Migraines   . Seizures ([place]) 2016   LAST SEIZURE 1-1.5 YRS AGO  . Sleep apnea    DOES NOT USE CPAP    Patient Active Problem List   Diagnosis Date Noted  . Right ovarian cyst 01/24/2017  . 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 Left carpal tunnel syndrome 05/14/2016  . Endometriosis 03/19/2016  . Bilateral hand numbness 03/02/2016  . De Quervain's tenosynovitis, bilateral 01/11/2016  . Family history of cancer 10/12/2015  . Pelvic pain in female 10/12/2015    Past Surgical History:  Procedure Laterality Date  . HAND TENDON SURGERY Bilateral 11/2015  . LAPAROSCOPY N/A 03/09/2016   Procedure: LAPAROSCOPY DIAGNOSTIC and D AND C;  Surgeon: Rubie Maid, MD;  Location: [facility] ORS;  Service: Gynecology;  Laterality: N/A;    Prior to Admission medications   Medication Sig Start Date  End Date Taking? Authorizing Provider  acetaminophen (TYLENOL 8 HOUR) 650 MG CR tablet Take 1 tablet (650 mg total) by mouth every 8 (eight) hours as needed for pain (mild). 03/09/16   Rubie Maid, MD  albuterol (PROAIR HFA) 108 (90 Base) MCG/ACT inhaler ProAir HFA 90 mcg/actuation aerosol inhaler  INHALE 1 PUFF ORALLY EVERY 6 HOURS AS NEEDED FOR FOR SHORTNESS OF BREATH Ellie Lunch    [provider]  azelastine (OPTIVAR) 0.05 % ophthalmic solution azelastine 0.05 % eye drops  INSTILL 1 DROP INTO BOTH EYES TWICE A DAY    [provider]  baclofen (LIORESAL) 10 MG tablet TAKE 1/2 TO 1 TABLET BY MOUTH EVERY 8 HOURS AS NEEDED FOR MUSCLE SPASM 06/20/16   [provider]  Biotin w/ Vitamins C & E (HAIR/SKIN/NAILS PO) Take by mouth.    [provider]  busPIRone (BUSPAR) 15 MG tablet buspirone 15 mg tablet    [provider]  butalbital-acetaminophen-caffeine (FIORICET, ESGIC) 50-325-40 MG tablet butalbital-acetaminophen-caffeine 50 mg-325 mg-40 mg tablet  TAKE 1 CAPSULE BY MOUTH EVERY 4 HOURS AS NEEDED FOR UP TO 3 DAYS    [provider]  clonazePAM (KLONOPIN) 0.5 MG tablet Take by mouth. 01/16/17   [provider]  dexamethasone (DECADRON) 0.5 MG tablet Take 1 tablet (0.5 mg total) by mouth 2 (two) times daily for 5 days. 11/10/17 11/15/17  Lannie Fields, PA-C  diclofenac (CATAFLAM) 50 MG tablet TAKE 1 TABLET (50 MG TOTAL) BY MOUTH 2 (  TWO) TIMES DAILY AS NEEDED. 10/05/16   [provider]  diclofenac (VOLTAREN) 75 MG EC tablet diclofenac sodium 75 mg tablet,delayed release    [provider]  docusate sodium (COLACE) 100 MG capsule Take 2 capsules (200 mg total) by mouth 2 (two) times daily. 10/19/16   Carrie Mew, MD  eletriptan (RELPAX) 40 MG tablet Relpax 40 mg tablet    [provider]  folic acid (FOLVITE) 1 MG tablet folic acid 1 mg tablet  TAKE 4 TABLETS BY MOUTH EVERY DAY    [provider]   HYDROcodone-acetaminophen (NORCO/VICODIN) 5-325 MG tablet Take 1-2 tablets by mouth every 4 (four) hours as needed. for pain 01/22/17   Rubie Maid, MD  ibuprofen (ADVIL,MOTRIN) 600 MG tablet Take 1 tablet (600 mg total) by mouth every 6 (six) hours as needed. 01/21/17   Rudene Re, MD  ketorolac (TORADOL) 10 MG tablet Take 1 tablet (10 mg total) by mouth every 6 (six) hours as needed. 10/20/16   Rubie Maid, MD  levETIRAcetam (KEPPRA) 750 MG tablet levetiracetam 750 mg tablet    [provider]  Lifitegrast (XIIDRA) 5 % SOLN INSTILL 1 DROP INTO BOTH EYES TWICE A DAY 07/02/16   [provider]  loteprednol (LOTEMAX) 0.2 % SUSP Apply to eye. 10/06/16   [provider]  loteprednol (LOTEMAX) 0.5 % ophthalmic suspension 1 drop as needed.    [provider]  Multiple Vitamin (MULTI-VITAMINS) TABS Take by mouth.    [provider]  norethindrone (AYGESTIN) 5 MG tablet norethindrone acetate 5 mg tablet    [provider]  nortriptyline (PAMELOR) 10 MG capsule Take by mouth. 06/02/16 06/02/17  [provider]  Olopatadine HCl (PATADAY) 0.2 % SOLN Pataday 0.2 % eye drops    [provider]  ondansetron (ZOFRAN) 4 MG tablet Take 1 tablet (4 mg total) by mouth every 8 (eight) hours as needed for up to 5 days for nausea or vomiting. 11/10/17 11/15/17  Lannie Fields, PA-C  pantoprazole (PROTONIX) 40 MG tablet Take 1 tablet (40 mg total) by mouth daily. 09/05/16 09/05/17  Menshew, Dannielle Karvonen, PA-C  polyethylene glycol (MIRALAX) packet Take 17 g by mouth daily. 01/27/17   Rubie Maid, MD  prednisoLONE acetate (PRED FORTE) 1 % ophthalmic suspension prednisolone acetate 1 % eye drops,suspension  1 DROP BOTH EYES 4X/DAY X 7 DAYS, 3X/DAY X 7 DAYS, 2X/DAY X 7 DAYS, 1X/DAY X 7 DAYS    [provider]  promethazine (PHENERGAN) 25 MG tablet Take by mouth. 10/05/16   [provider]  promethazine (PHENERGAN) 25 MG tablet Take 1  tablet (25 mg total) by mouth every 4 (four) hours as needed for nausea or vomiting. 01/29/17   Earleen Newport, MD  propranolol ER (INDERAL LA) 60 MG 24 hr capsule Take by mouth. 10/05/16 10/05/17  [provider]  ranitidine (ZANTAC) 150 MG tablet ranitidine 150 mg tablet  Take 1 tablet twice a day by oral route before meals.    [provider]  rizatriptan (MAXALT) 10 MG tablet rizatriptan 10 mg tablet    [provider]  senna (SENOKOT) 8.6 MG TABS tablet Take 2 tablets (17.2 mg total) by mouth 2 (two) times daily. 10/19/16   Carrie Mew, MD  simethicone (MYLICON) 80 MG chewable tablet Gas Relief 80 mg chewable tablet  CHEW 1 TABLET (80 MG TOTAL) BY MOUTH EVERY 6 (SIX) HOURS AS NEEDED FOR FLATULENCE.    [provider]  traZODone (DESYREL)  50 MG tablet trazodone 50 mg tablet    [provider]  XIIDRA 5 % SOLN INSTILL 1 DROP INTO BOTH EYES TWICE A DAY 07/02/16   [provider]    Allergies Aspartame; Prednisone; Ibuprofen; Latex; Lavender oil; and Penicillins  Family History  Problem Relation Age of Onset  . Scoliosis Brother   . Fibromyalgia Mother   . Cancer Mother 53       ovarian; s/p partial hysterectomy with RSO  . Graves' disease Mother   . Alzheimer's disease Other   . Cancer Maternal Grandmother 30       breast cancer (unilateral lumpectomy)  . Breast cancer Maternal Grandmother     Social History Social History   Tobacco Use  . Smoking status: Former Smoker    Packs/day: 0.50    Years: 1.50    Pack years: 0.75    Types: Cigarettes    Last attempt to quit: 03/05/2011    Years since quitting: 6.6  . Smokeless tobacco: Never Used  Substance Use Topics  . Alcohol use: Yes    Comment: occ  . Drug use: No     Review of Systems  Constitutional: No fever/chills Eyes: No visual changes. No discharge ENT: No upper respiratory complaints. Cardiovascular: no chest pain. Respiratory: no cough. No  SOB. Gastrointestinal: No abdominal pain.  No nausea, no vomiting.  No diarrhea.  No constipation. Genitourinary: Negative for dysuria. No hematuria Musculoskeletal: Patient has bilateral feet pain.  Skin: Negative for rash, abrasions, lacerations, ecchymosis. Neurological: Negative for headaches, focal weakness or numbness.   ____________________________________________   PHYSICAL EXAM:  VITAL SIGNS: ED Triage Vitals  Enc Vitals Group     BP 11/10/17 1853 115/87     Pulse Rate 11/10/17 1853 80     Resp 11/10/17 1853 16     Temp 11/10/17 1853 97.9 F (36.6 C)     Temp Source 11/10/17 1853 Oral     SpO2 11/10/17 1853 99 %     Weight 11/10/17 1854 134 lb 6.4 oz (61 kg)     Height 11/10/17 1854 5\' 4"  (1.626 m)     Head Circumference --      Peak Flow --      Pain Score 11/10/17 1853 10     Pain Loc --      Pain Edu? --      Excl. in [place]? --      Constitutional: Alert and oriented. Well appearing and in no acute distress. Eyes: Conjunctivae are normal. PERRL. EOMI. Head: Atraumatic. Cardiovascular: Normal rate, regular rhythm. Normal S1 and S2.  Good peripheral circulation. Respiratory: Normal respiratory effort without tachypnea or retractions. Lungs CTAB. Good air entry to the bases with no decreased or absent breath sounds. Musculoskeletal: Patient has reproducible pain to palpation along the distribution of the plantar fascia bilaterally. Neurologic:  Normal speech and language. No gross focal neurologic deficits are appreciated.  Skin:  Skin is warm, dry and intact. No rash noted. Psychiatric: Mood and affect are normal. Speech and behavior are normal. Patient exhibits appropriate insight and judgement.   ____________________________________________   LABS (all labs ordered are listed, but only abnormal results are displayed)  Labs Reviewed - No data to  display ____________________________________________  EKG   ____________________________________________  RADIOLOGY   No results found.  ____________________________________________    PROCEDURES  Procedure(s) performed:    Procedures    Medications - No data to display   ____________________________________________   INITIAL IMPRESSION / ASSESSMENT  AND PLAN / ED COURSE  Pertinent labs & imaging results that were available during my care of the patient were reviewed by me and considered in my medical decision making (see chart for details).  Review of the [place] CSRS was performed in accordance of the [place] prior to dispensing any controlled drugs.      Assessment and plan Plantar fasciitis Patient presents to the emergency department with bilateral feet pain.  Patient has a history of plantar fasciitis and physical exam findings are consistent with plantar fasciitis.  Patient was started empirically on a brief course of Decadron and advised to follow-up with podiatry.  Patient is already exhausting most conservative management options.    ____________________________________________  FINAL CLINICAL IMPRESSION(S) / ED DIAGNOSES  Final diagnoses:  Plantar fasciitis      NEW MEDICATIONS STARTED DURING THIS VISIT:  ED Discharge Orders        Ordered    dexamethasone (DECADRON) 0.5 MG tablet  2 times daily     11/10/17 1948    ondansetron (ZOFRAN) 4 MG tablet  Every 8 hours PRN     11/10/17 1948          This chart was dictated using voice recognition software/Dragon. Despite best efforts to proofread, errors can occur which can change the meaning. Any change was purely unintentional.    Karren Cobble 11/10/17 2120    Nena Polio, MD 11/10/17 2241

## 2017-11-16 DIAGNOSIS — G43711 Chronic migraine without aura, intractable, with status migrainosus: Secondary | ICD-10-CM | POA: Insufficient documentation

## 2017-11-30 ENCOUNTER — Ambulatory Visit: Payer: Medicare Other | Admitting: Podiatry

## 2017-12-16 IMAGING — CR DG CHEST 2V
2 series · 2 of 2 positions shown · non-contrast
Comparison: 01/09/2016

CLINICAL DATA: Cough and chills, onset today.

EXAM:
CHEST  2 VIEW

[chest pa]
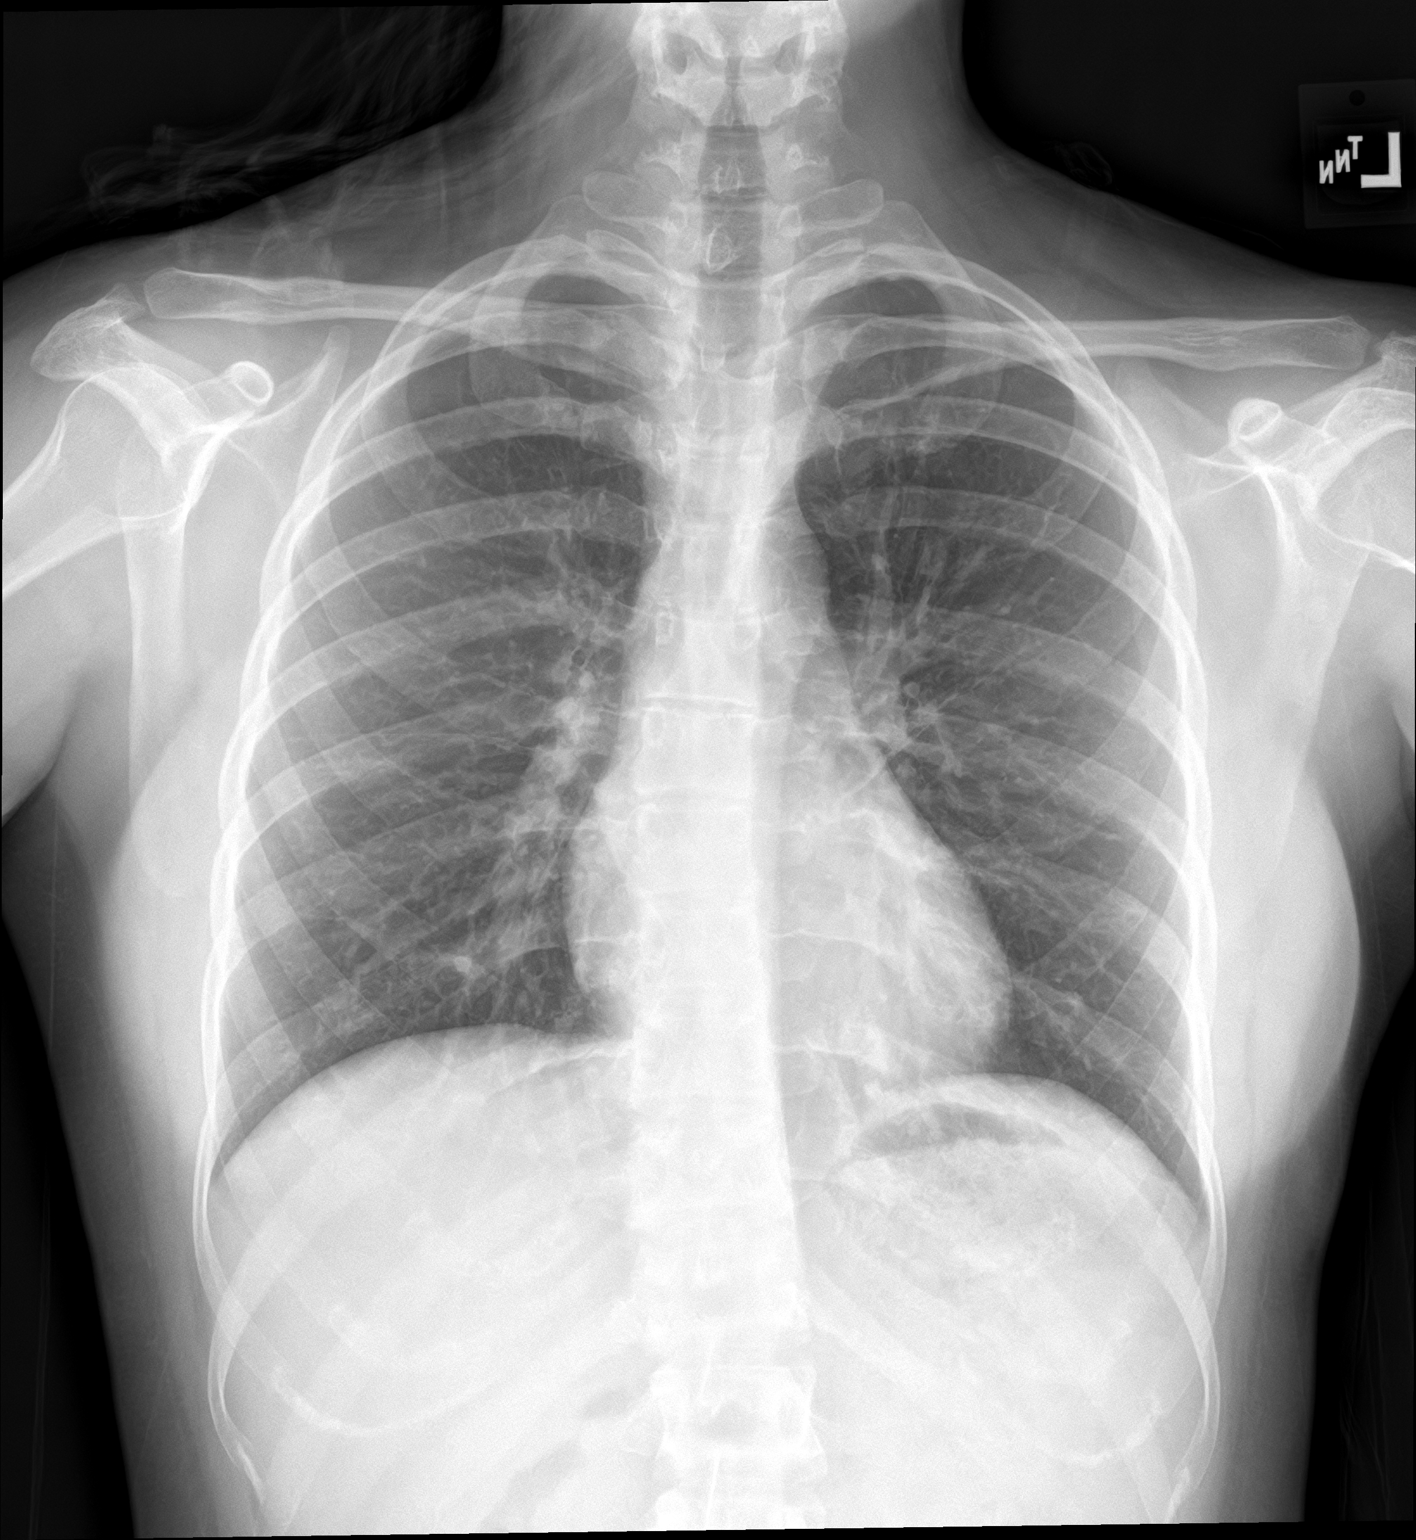

[chest lat]
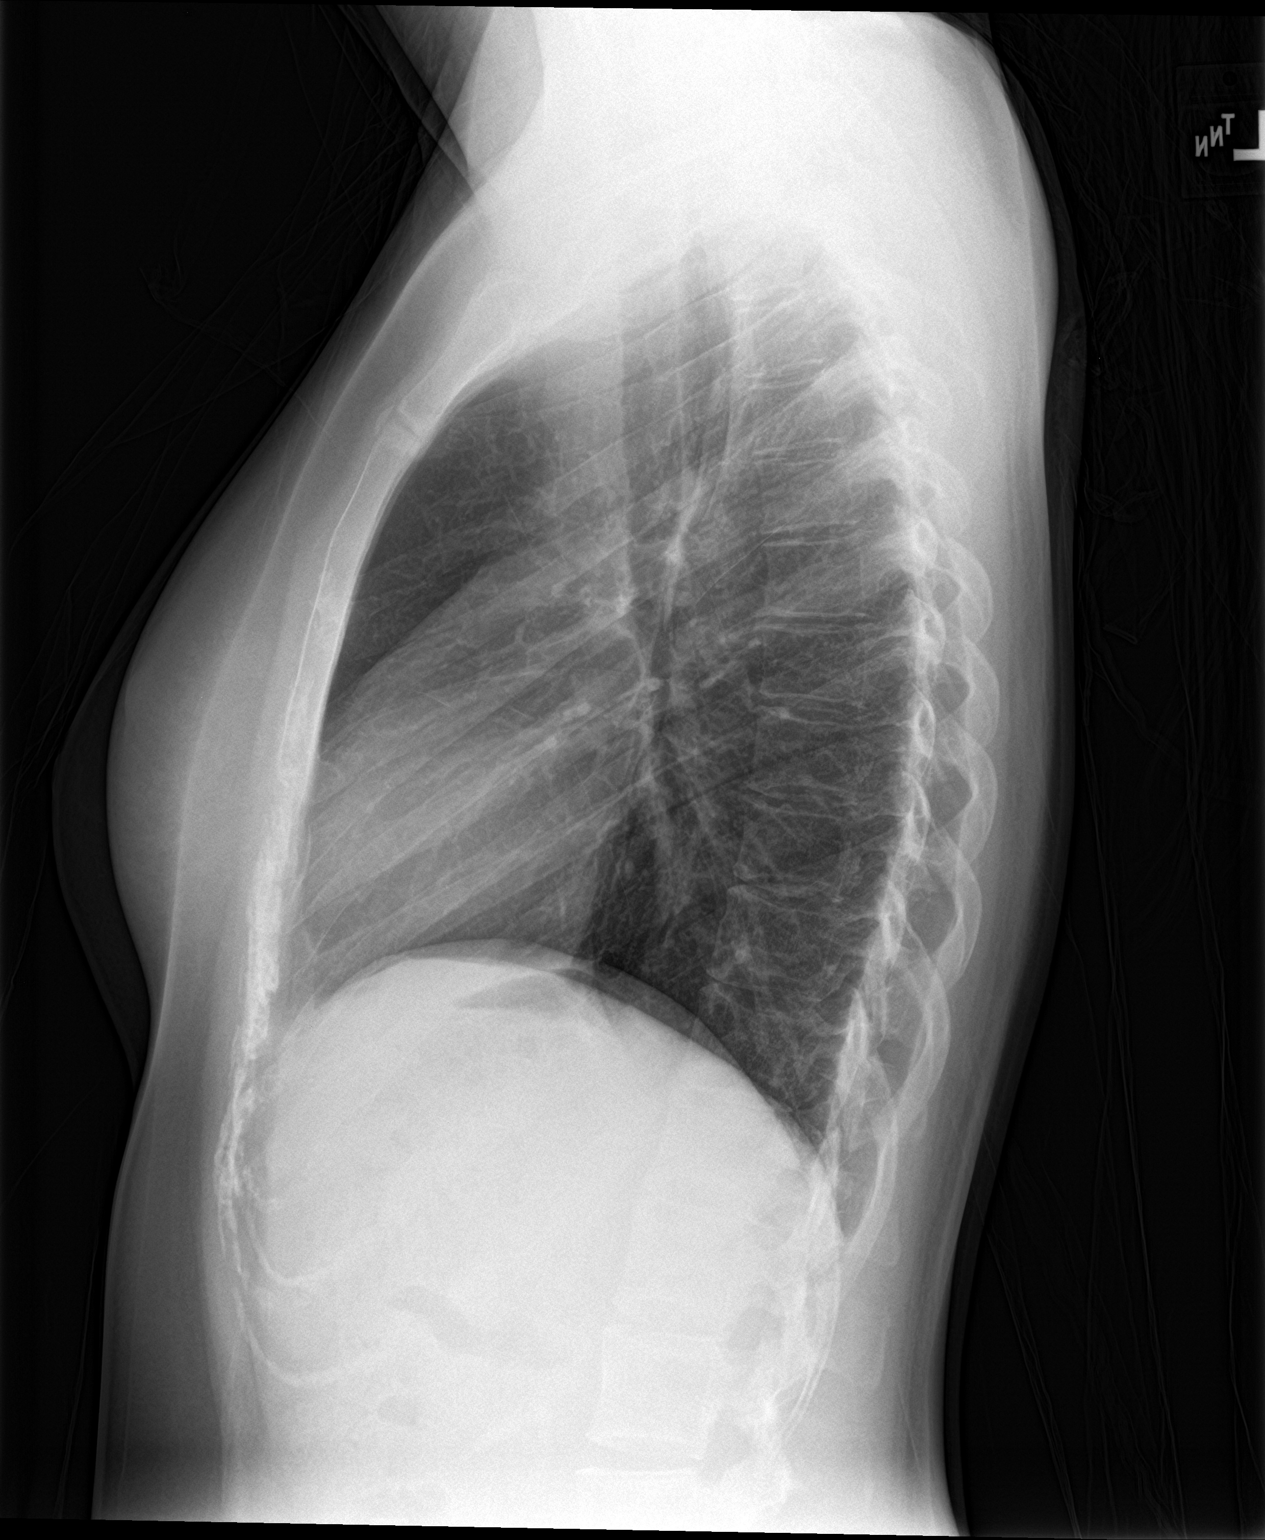

[2 of 2 positions shown; findings below may reference images not displayed]

FINDINGS: The cardiomediastinal contours are normal. The lungs are clear.
Pulmonary vasculature is normal. No consolidation, pleural effusion,
or pneumothorax. No acute osseous abnormalities are seen.
IMPRESSION: No acute pulmonary process.

## 2018-03-22 IMAGING — US US TRANSVAGINAL NON-OB
1 series · 13 of 25 positions shown · non-contrast
Comparison: Prior CT from earlier the same day.

CLINICAL DATA: Initial evaluation for acute right lower quadrant
abdominal pain.

EXAM:
TRANSABDOMINAL AND TRANSVAGINAL ULTRASOUND OF PELVIS
DOPPLER ULTRASOUND OF OVARIES
TECHNIQUE: Both transabdominal and transvaginal ultrasound examinations of the
pelvis were performed. Transabdominal technique was performed for
global imaging of the pelvis including uterus, ovaries, adnexal
regions, and pelvic cul-de-sac.
It was necessary to proceed with endovaginal exam following the
transabdominal exam to visualize the uterus and ovaries. Color and
duplex Doppler ultrasound was utilized to evaluate blood flow to the
ovaries.

[Series 1: us transvaginal non-ob · 0.21mm/px · 13 of 67 slices shown]
[im 1/67]
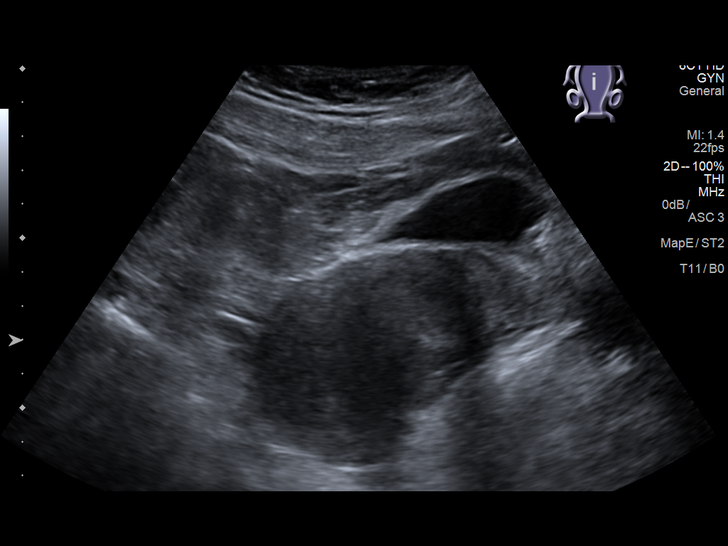
[im 6/67]
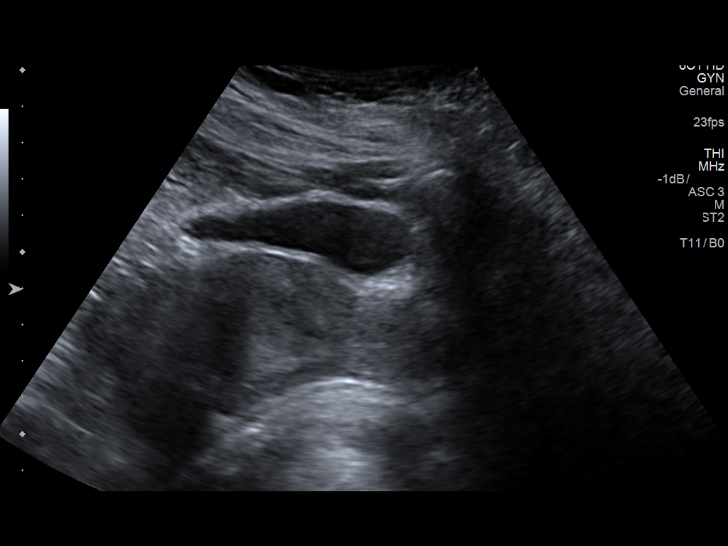
[im 12/67]
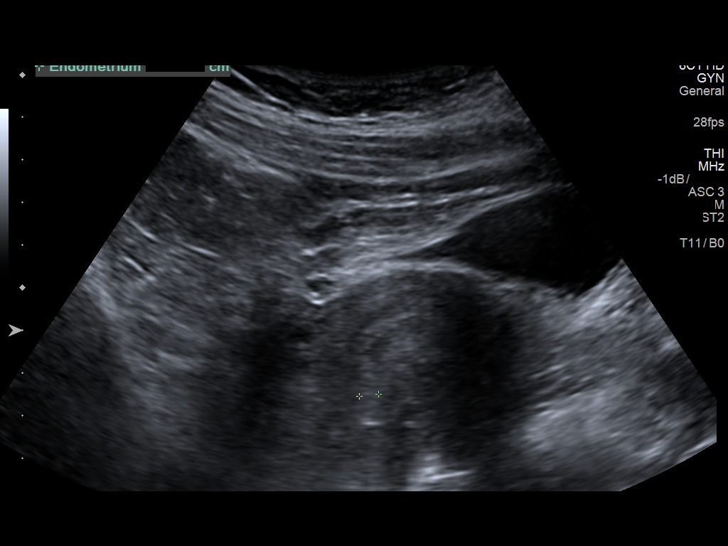
[im 17/67]
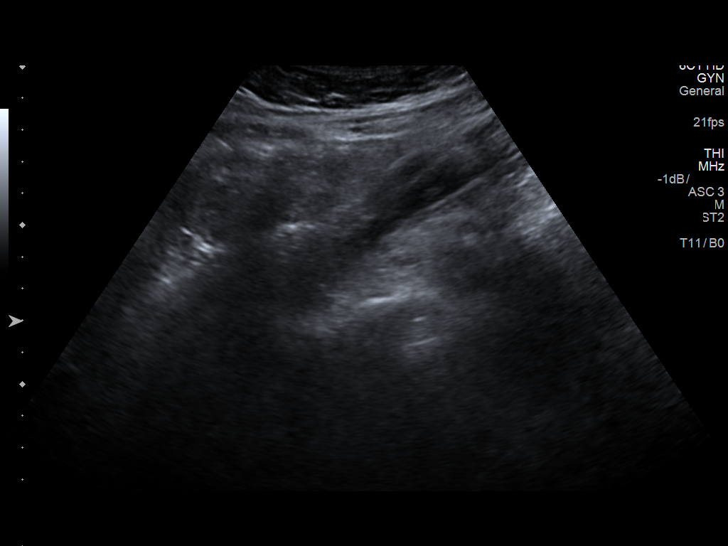
[im 23/67]
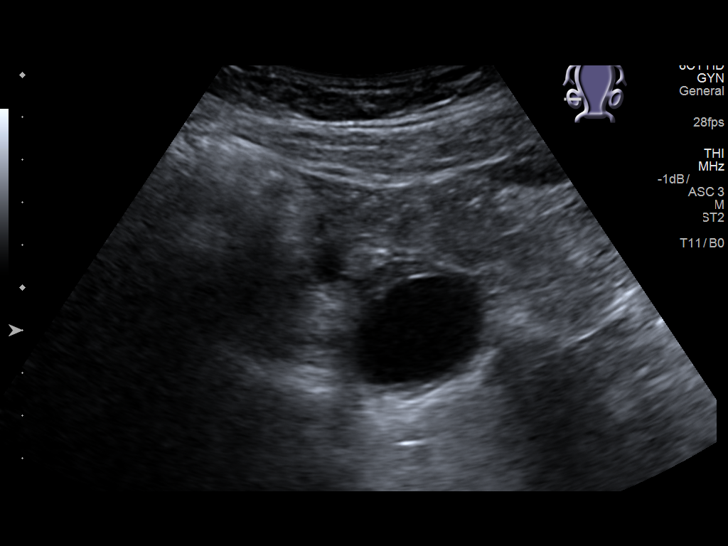
[im 28/67]
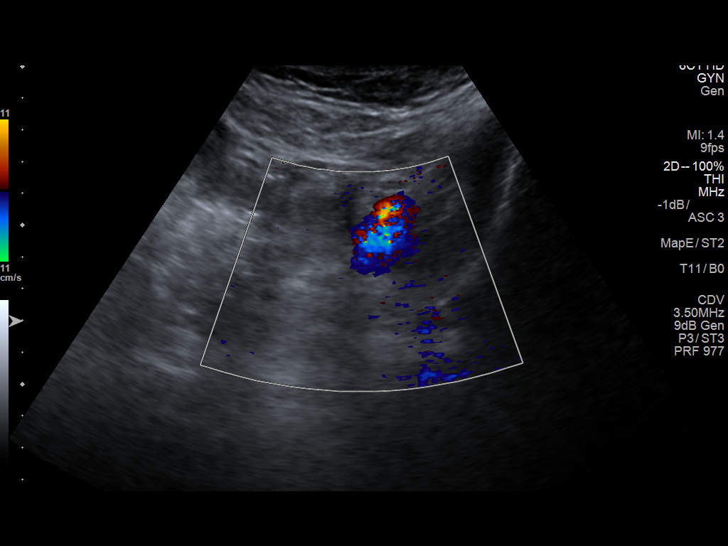
[im 34/67]
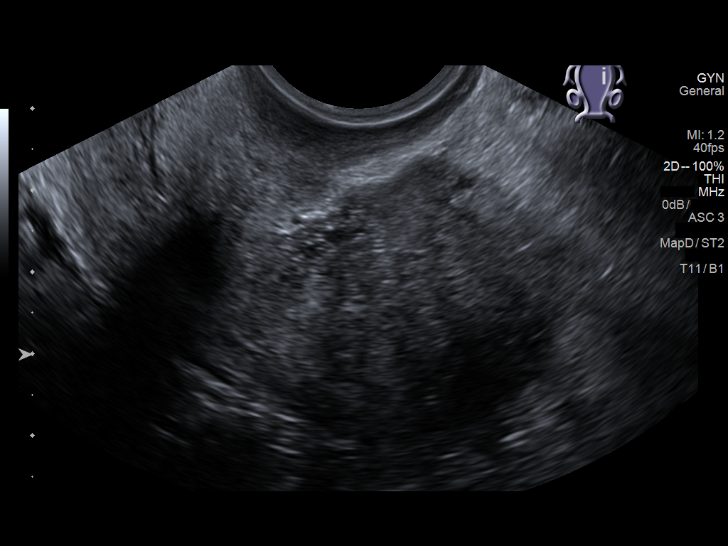
[im 39/67]
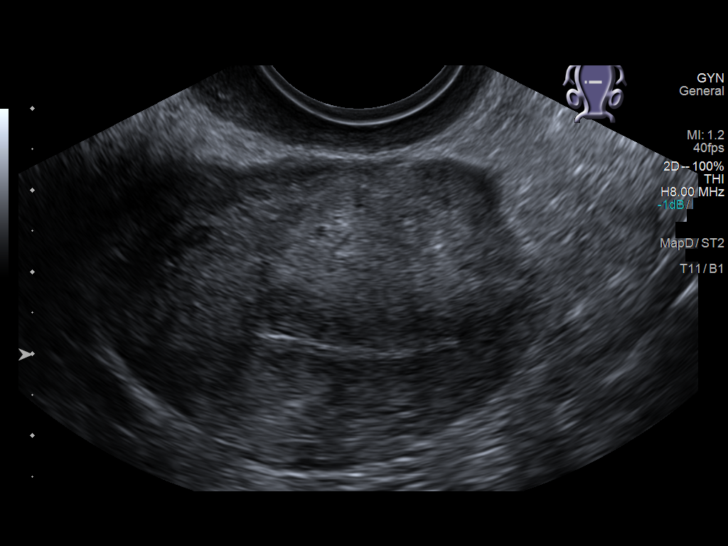
[im 45/67]
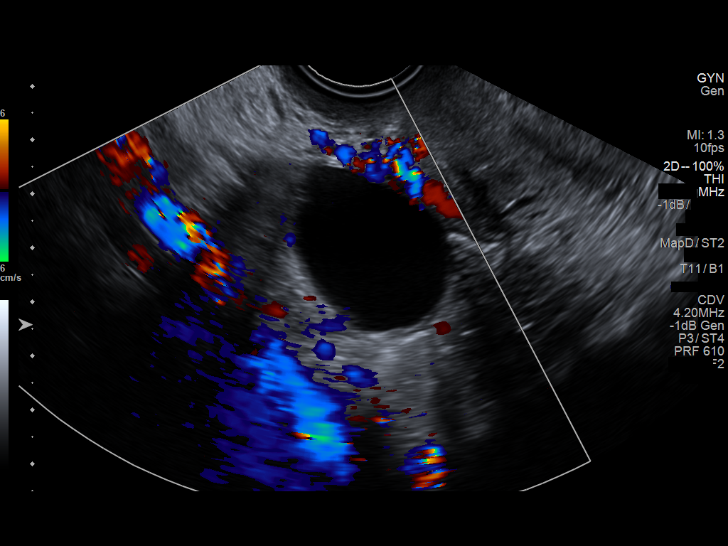
[im 50/67]
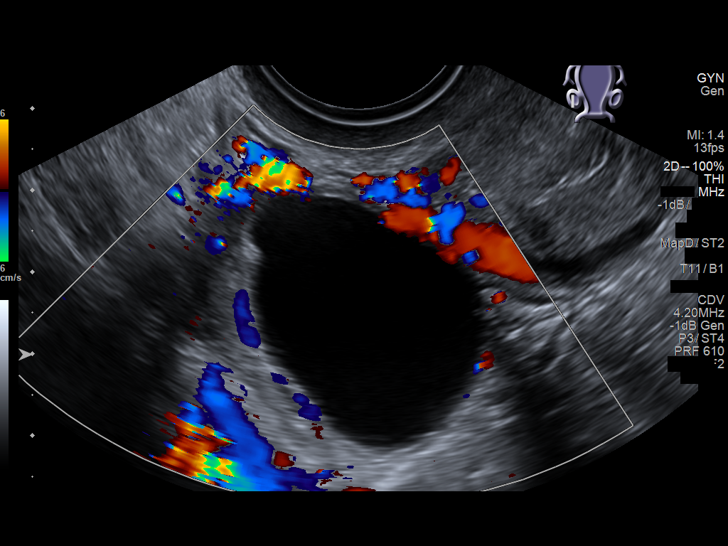
[im 56/67]
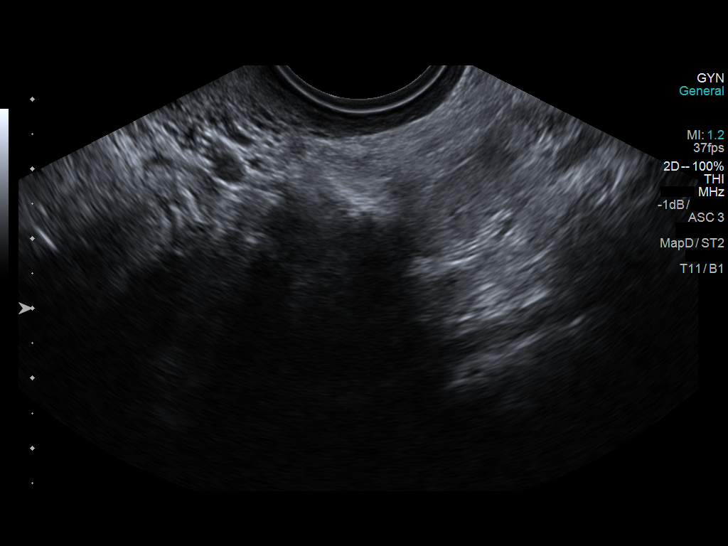
[im 61/67]
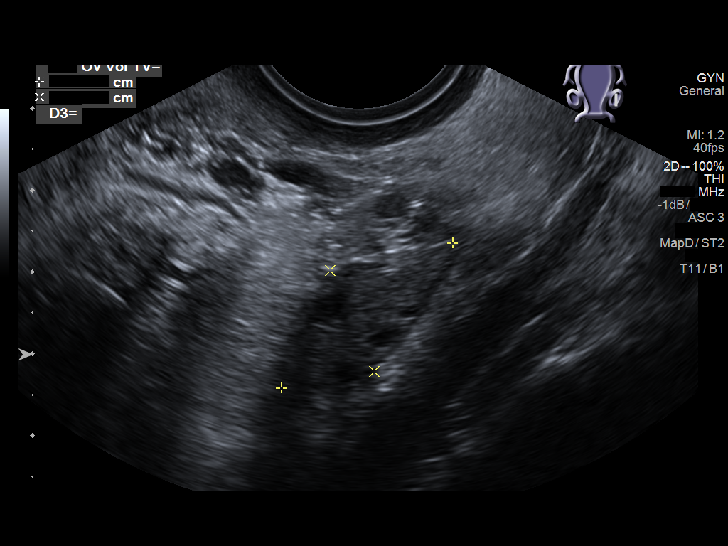
[im 67/67]
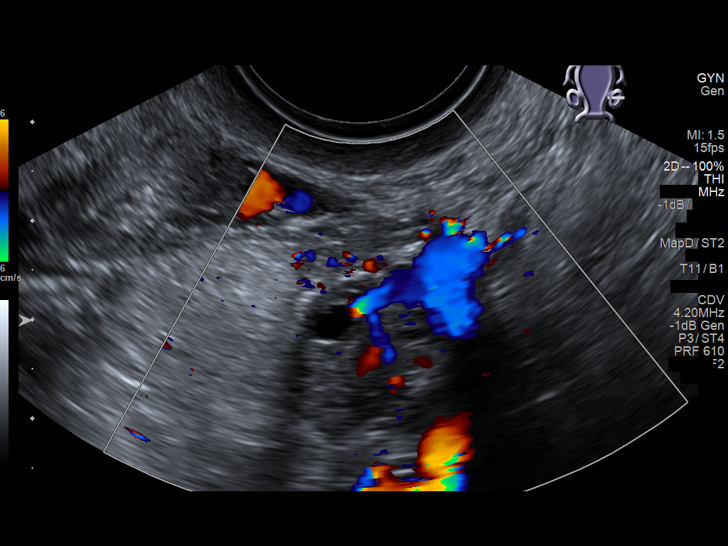

[13 of 25 positions shown; findings below may reference images not displayed]

FINDINGS: Uterus

Measurements: 7.7 x 4.7 x 5.5 cm. No fibroids or other mass
visualized.

Endometrium

Thickness: 4.5 mm.  No focal abnormality visualized.

Right ovary

Measurements: 3.7 x 2.9 x 3.6 cm. 3.2 x 2.4 x 2.9 cm simple anechoic
cyst, most consistent with a normal physiologic cyst. No internal
complexity or concerning features.

Left ovary

Measurements: 2.7 x 1.3 x 1.5 cm. Normal appearance/no adnexal mass.

Pulsed Doppler evaluation of both ovaries demonstrates normal
low-resistance arterial and venous waveforms.

Other findings

No abnormal free fluid.
IMPRESSION: 1. 3.2 x 2.4 x 2.9 cm simple right ovarian cyst, most consistent
with a normal physiologic cyst. No follow-up is recommended for this
lesion. This recommendation follows the consensus statement:
Management of Asymptomatic Ovarian and Other Adnexal Cysts Imaged at
US: Society of Radiologists in Ultrasound Consensus Conference
2. Otherwise normal pelvic ultrasound. No acute abnormality
identified. No evidence for torsion.

## 2018-04-07 ENCOUNTER — Emergency Department
Admission: EM | Admit: 2018-04-07 | Discharge: 2018-04-07 | Disposition: A | Discharge status: Home / Self Care | Payer: Medicare Other | Attending: Emergency Medicine | Admitting: Emergency Medicine

## 2018-04-07 ENCOUNTER — Emergency Department: Payer: Medicare Other

## 2018-04-07 ENCOUNTER — Other Ambulatory Visit: Payer: Self-pay

## 2018-04-07 DIAGNOSIS — Z91040 Latex allergy status: Secondary | ICD-10-CM | POA: Diagnosis not present

## 2018-04-07 DIAGNOSIS — J45909 Unspecified asthma, uncomplicated: Secondary | ICD-10-CM | POA: Diagnosis not present

## 2018-04-07 DIAGNOSIS — Z87891 Personal history of nicotine dependence: Secondary | ICD-10-CM | POA: Insufficient documentation

## 2018-04-07 DIAGNOSIS — Z79899 Other long term (current) drug therapy: Secondary | ICD-10-CM | POA: Insufficient documentation

## 2018-04-07 DIAGNOSIS — R0789 Other chest pain: Secondary | ICD-10-CM | POA: Diagnosis not present

## 2018-04-07 DIAGNOSIS — R079 Chest pain, unspecified: Secondary | ICD-10-CM | POA: Diagnosis present

## 2018-04-07 DIAGNOSIS — N189 Chronic kidney disease, unspecified: Secondary | ICD-10-CM | POA: Insufficient documentation

## 2018-04-07 LAB — FIBRIN DERIVATIVES D-DIMER (ARMC ONLY): FIBRIN DERIVATIVES D-DIMER (ARMC): 324.17 ng{FEU}/mL (ref 0.00–499.00)

## 2018-04-07 LAB — BASIC METABOLIC PANEL
Anion gap: 11 (ref 5–15)
BUN: 14 mg/dL (ref 6–20)
CHLORIDE: 106 mmol/L (ref 98–111)
CO2: 24 mmol/L (ref 22–32)
Calcium: 9.1 mg/dL (ref 8.9–10.3)
Creatinine, Ser: 0.73 mg/dL (ref 0.44–1.00)
Glucose, Bld: 97 mg/dL (ref 70–99)
POTASSIUM: 4 mmol/L (ref 3.5–5.1)
SODIUM: 141 mmol/L (ref 135–145)

## 2018-04-07 LAB — CBC
HEMATOCRIT: 40.5 % (ref 36.0–46.0)
HEMOGLOBIN: 13.7 g/dL (ref 12.0–15.0)
MCH: 28.7 pg (ref 26.0–34.0)
MCHC: 33.8 g/dL (ref 30.0–36.0)
MCV: 84.9 fL (ref 80.0–100.0)
Platelets: 235 10*3/uL (ref 150–400)
RBC: 4.77 MIL/uL (ref 3.87–5.11)
RDW: 12.1 % (ref 11.5–15.5)
WBC: 3.6 10*3/uL — AB (ref 4.0–10.5)
nRBC: 0 % (ref 0.0–0.2)

## 2018-04-07 LAB — TROPONIN I

## 2018-04-07 MED ORDER — DIAZEPAM 2 MG PO TABS
2.0000 mg | ORAL_TABLET | Freq: Once | ORAL | Status: AC
  Administered 2018-04-07: 2 mg via ORAL
  Filled 2018-04-07: qty 1

## 2018-04-07 NOTE — ED Notes (Signed)
FIRST NURSE NOTE:  Pt reports feeling discomfort in chest, states "I feel like I'm dying"  No distress noted on arrival.

## 2018-04-07 NOTE — ED Notes (Signed)
Pt calling ride for discharge states she came by Grace Norris, pt A&OX4, VSS, ambulatory. PT verbalizes d/c understanding and following up with psych and cardiologist/ PT dneies any SI or HI at d/c

## 2018-04-07 NOTE — Discharge Instructions (Addendum)
You are evaluated for chest discomfort, and although no certain cause was found, your exam and evaluation are overall reassuring in the emergency department today.  As we discussed, please follow-up with Dr. Elam Dutch office for discussion of any additional evaluation for the heart.  As we discussed, I also think that anxiety may be contributing to ongoing symptoms, and I would recommend that you also follow-up with your psychiatrist and therapist.  Return to emerge department immediately for any worsening condition including worsening or uncontrolled pain, nausea, dizziness, passing out, weakness, numbness, confusion or altered mental status, or any other symptoms concerning to you.

## 2018-04-07 NOTE — ED Provider Notes (Signed)
[facility] Emergency Department Provider Note ____________________________________________   I have reviewed the triage vital signs and the triage nursing note.  HISTORY  Chief Complaint Chest Pain   Historian Patient  HPI Grace Norris is a 27 y.o. female with a history of seizures, anxiety, endometriosis, headaches, and recently dizziness, and central chest pain.  She is here for evaluation of chest pain.  She states this is been going on for what sounds like multiple weeks at this point.  She has seen a cardiologist, and names Dr. Terance Hart and Dr. Brigitte Pulse with [facility] cardiology.  States that they thought at one point she might have pericarditis and then she was told that she probably does not have peritonitis.  Patient reports that she feels lightheaded while in the room with her.  She had an "attack" of central chest pain while is in the room.  She has frequent headaches.  No recent seizure or confusion or altered mental status.  She requests to be admitted "just for monitoring overnight.  "  She states that she feels scared to sleep at night and stays awake.  She does follow with a psychiatrist, but states that she never really gets to talk that long and just gets prescribed medication.  She states that she does have a therapist/counselor.  Denies coughing or fevers or other respiratory complaints.    Past Medical History:  Diagnosis Date  . Anemia   . Asthma    WELL CONTROLLED  . Chronic kidney disease    H/O STONES  . 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 Incidental finding noted on CT scan  . Endometriosis 03/19/2016  . Epilepsy ([place])   . Migraines   . Seizures ([place]) 2016   LAST SEIZURE 1-1.5 YRS AGO  . Sleep apnea    DOES NOT USE CPAP    Patient Active Problem List   Diagnosis Date Noted  . Right ovarian cyst 01/24/2017  . Localization-related focal epilepsy with complex partial seizures ([place])  10/05/2016  . Left carpal tunnel syndrome 05/14/2016  . Endometriosis 03/19/2016  . Bilateral hand numbness 03/02/2016  . De Quervain's tenosynovitis, bilateral 01/11/2016  . Family history of cancer 10/12/2015  . Pelvic pain in female 10/12/2015    Past Surgical History:  Procedure Laterality Date  . HAND TENDON SURGERY Bilateral 11/2015  . LAPAROSCOPY N/A 03/09/2016   Procedure: LAPAROSCOPY DIAGNOSTIC and D AND C;  Surgeon: Rubie Maid, MD;  Location: [facility] ORS;  Service: Gynecology;  Laterality: N/A;    Prior to Admission medications   Medication Sig Start Date End Date Taking? Authorizing Provider  acetaminophen (TYLENOL 8 HOUR) 650 MG CR tablet Take 1 tablet (650 mg total) by mouth every 8 (eight) hours as needed for pain (mild). 03/09/16   Rubie Maid, MD  albuterol (PROAIR HFA) 108 (90 Base) MCG/ACT inhaler ProAir HFA 90 mcg/actuation aerosol inhaler  INHALE 1 PUFF ORALLY EVERY 6 HOURS AS NEEDED FOR FOR SHORTNESS OF BREATH Ellie Lunch    [provider]  azelastine (OPTIVAR) 0.05 % ophthalmic solution azelastine 0.05 % eye drops  INSTILL 1 DROP INTO BOTH EYES TWICE A DAY    [provider]  baclofen (LIORESAL) 10 MG tablet TAKE 1/2 TO 1 TABLET BY MOUTH EVERY 8 HOURS AS NEEDED FOR MUSCLE SPASM 06/20/16   [provider]  Biotin w/ Vitamins C & E (HAIR/SKIN/NAILS PO) Take by mouth.    [provider]  busPIRone (BUSPAR) 15 MG tablet buspirone 15  mg tablet    [provider]  butalbital-acetaminophen-caffeine (FIORICET, ESGIC) 50-325-40 MG tablet butalbital-acetaminophen-caffeine 50 mg-325 mg-40 mg tablet  TAKE 1 CAPSULE BY MOUTH EVERY 4 HOURS AS NEEDED FOR UP TO 3 DAYS    [provider]  clonazePAM (KLONOPIN) 0.5 MG tablet Take by mouth. 01/16/17   [provider]  diclofenac (CATAFLAM) 50 MG tablet TAKE 1 TABLET (50 MG TOTAL) BY MOUTH 2 (TWO) TIMES DAILY AS NEEDED. 10/05/16   [provider]  diclofenac (VOLTAREN)  75 MG EC tablet diclofenac sodium 75 mg tablet,delayed release    [provider]  docusate sodium (COLACE) 100 MG capsule Take 2 capsules (200 mg total) by mouth 2 (two) times daily. 10/19/16   Carrie Mew, MD  eletriptan (RELPAX) 40 MG tablet Relpax 40 mg tablet    [provider]  folic acid (FOLVITE) 1 MG tablet folic acid 1 mg tablet  TAKE 4 TABLETS BY MOUTH EVERY DAY    [provider]  HYDROcodone-acetaminophen (NORCO/VICODIN) 5-325 MG tablet Take 1-2 tablets by mouth every 4 (four) hours as needed. for pain 01/22/17   Rubie Maid, MD  ibuprofen (ADVIL,MOTRIN) 600 MG tablet Take 1 tablet (600 mg total) by mouth every 6 (six) hours as needed. 01/21/17   Rudene Re, MD  ketorolac (TORADOL) 10 MG tablet Take 1 tablet (10 mg total) by mouth every 6 (six) hours as needed. 10/20/16   Rubie Maid, MD  levETIRAcetam (KEPPRA) 750 MG tablet levetiracetam 750 mg tablet    [provider]  Lifitegrast (XIIDRA) 5 % SOLN INSTILL 1 DROP INTO BOTH EYES TWICE A DAY 07/02/16   [provider]  loteprednol (LOTEMAX) 0.2 % SUSP Apply to eye. 10/06/16   [provider]  loteprednol (LOTEMAX) 0.5 % ophthalmic suspension 1 drop as needed.    [provider]  Multiple Vitamin (MULTI-VITAMINS) TABS Take by mouth.    [provider]  norethindrone (AYGESTIN) 5 MG tablet norethindrone acetate 5 mg tablet    [provider]  nortriptyline (PAMELOR) 10 MG capsule Take by mouth. 06/02/16 06/02/17  [provider]  Olopatadine HCl (PATADAY) 0.2 % SOLN Pataday 0.2 % eye drops    [provider]  pantoprazole (PROTONIX) 40 MG tablet Take 1 tablet (40 mg total) by mouth daily. 09/05/16 09/05/17  Menshew, Dannielle Karvonen, PA-C  polyethylene glycol (MIRALAX) packet Take 17 g by mouth daily. 01/27/17   Rubie Maid, MD  prednisoLONE acetate (PRED FORTE) 1 % ophthalmic suspension prednisolone acetate 1 % eye drops,suspension  1  DROP BOTH EYES 4X/DAY X 7 DAYS, 3X/DAY X 7 DAYS, 2X/DAY X 7 DAYS, 1X/DAY X 7 DAYS    [provider]  promethazine (PHENERGAN) 25 MG tablet Take by mouth. 10/05/16   [provider]  promethazine (PHENERGAN) 25 MG tablet Take 1 tablet (25 mg total) by mouth every 4 (four) hours as needed for nausea or vomiting. 01/29/17   Earleen Newport, MD  propranolol ER (INDERAL LA) 60 MG 24 hr capsule Take by mouth. 10/05/16 10/05/17  [provider]  ranitidine (ZANTAC) 150 MG tablet ranitidine 150 mg tablet  Take 1 tablet twice a day by oral route before meals.    [provider]  rizatriptan (MAXALT) 10 MG tablet rizatriptan 10 mg tablet    [provider]  senna (SENOKOT) 8.6 MG TABS tablet Take 2 tablets (17.2 mg total) by mouth 2 (two) times daily. 10/19/16   Carrie Mew, MD  simethicone (  MYLICON) 80 MG chewable tablet Gas Relief 80 mg chewable tablet  CHEW 1 TABLET (80 MG TOTAL) BY MOUTH EVERY 6 (SIX) HOURS AS NEEDED FOR FLATULENCE.    [provider]  traZODone (DESYREL) 50 MG tablet trazodone 50 mg tablet    [provider]  XIIDRA 5 % SOLN INSTILL 1 DROP INTO BOTH EYES TWICE A DAY 07/02/16   [provider]    Allergies  Allergen Reactions  . Aspartame Other (See Comments)    Other reaction(s): Other (See Comments) Seizures Seizures Other reaction(s): Other (See Comments) Seizures  . Prednisone Nausea And Vomiting    Other reaction(s): Vomiting Other reaction(s): Vomiting  . Ibuprofen Nausea And Vomiting and Rash    Other reaction(s): Nausea And Vomiting  . Latex Rash  . Lavender Oil Rash  . Penicillins Other (See Comments)    Other reaction(s): Other (See Comments) Patient reports seizures Other reaction(s): Other (See Comments) Seizures Seizures Unknown if it caused rash,swelling, shortness of breath or skin necrosis. Patient says she has not had a reaction in past ten years. Has not taken Penicillin since  diagnosed with epilepsy.    Family History  Problem Relation Age of Onset  . Scoliosis Brother   . Fibromyalgia Mother   . Cancer Mother 46       ovarian; s/p partial hysterectomy with RSO  . Graves' disease Mother   . Alzheimer's disease Other   . Cancer Maternal Grandmother 30       breast cancer (unilateral lumpectomy)  . Breast cancer Maternal Grandmother     Social History Social History   Tobacco Use  . Smoking status: Former Smoker    Packs/day: 0.50    Years: 1.50    Pack years: 0.75    Types: Cigarettes    Last attempt to quit: 03/05/2011    Years since quitting: 7.0  . Smokeless tobacco: Never Used  Substance Use Topics  . Alcohol use: Yes    Comment: occ  . Drug use: No    Review of Systems  Constitutional: Negative for fever. Eyes: Negative for visual changes. ENT: Negative for sore throat. Cardiovascular: As if her intermittent central chest pain that comes in "attacks ". Respiratory: Negative for coughing or pleuritic chest pain. Gastrointestinal: Negative for abdominal pain, vomiting and diarrhea. Genitourinary: Negative for dysuria. Musculoskeletal: Negative for back pain. Skin: Negative for rash. Neurological: Has a history of headaches, but not today.  States that she has intermittent lightheadedness and dizziness.  ____________________________________________   PHYSICAL EXAM:  VITAL SIGNS: ED Triage Vitals  Enc Vitals Group     BP 04/07/18 1009 109/65     Pulse Rate 04/07/18 1009 78     Resp 04/07/18 1009 17     Temp 04/07/18 1009 97.7 F (36.5 C)     Temp Source 04/07/18 1009 Oral     SpO2 04/07/18 1009 99 %     Weight 04/07/18 1007 138 lb (62.6 kg)     Height 04/07/18 1007 5\' 4"  (1.626 m)     Head Circumference --      Peak Flow --      Pain Score 04/07/18 1007 10     Pain Loc --      Pain Edu? --      Excl. in [place]? --      Constitutional: Alert and oriented.  Quite anxious. HEENT      Head: Normocephalic and atraumatic.       Eyes: Conjunctivae are normal.  Pupils equal and round.       Ears:         Nose: No congestion/rhinnorhea.      Mouth/Throat: Mucous membranes are moist.      Neck: No stridor. Cardiovascular/Chest: Normal rate, regular rhythm.  No murmurs, rubs, or gallops. Respiratory: Normal respiratory effort without tachypnea nor retractions. Breath sounds are clear and equal bilaterally. No wheezes/rales/rhonchi. Gastrointestinal: Soft. No distention, no guarding, no rebound. Nontender.    Genitourinary/rectal:Deferred Musculoskeletal: Nontender with normal range of motion in all extremities. No joint effusions.  No lower extremity tenderness.  No edema. Neurologic: Facial droop.  Normal speech and language. No gross or focal neurologic deficits are appreciated. Skin:  Skin is warm, dry and intact. No rash noted. Psychiatric: Quite anxious.   ____________________________________________  LABS (pertinent positives/negatives) I, Lisa Roca, MD the attending physician have reviewed the labs noted below.  Labs Reviewed  CBC - Abnormal; Notable for the following components:      Result Value   WBC 3.6 (*)    All other components within normal limits  BASIC METABOLIC PANEL  TROPONIN I  FIBRIN DERIVATIVES D-DIMER ([facility] ONLY)    ____________________________________________    EKG I, Lisa Roca, MD, the attending physician have personally viewed and interpreted all ECGs.  79 bpm.  Normal sinus rhythm.  Narrow QRS.  Normal axis.  Normal ST and T wave.  No evidence of Brugada or Wolff-Parkinson-White. ____________________________________________  RADIOLOGY   Chest x-ray two-view: No active cardia pulmonary disease __________________________________________  PROCEDURES  Procedure(s) performed: None  Procedures  Critical Care performed: None   ____________________________________________  ED COURSE / ASSESSMENT AND PLAN  Pertinent labs & imaging results that were available  during my care of the patient were reviewed by me and considered in my medical decision making (see chart for details).     Patient is quite anxious when I went in to see her.  As we discussed her symptoms of atypical chest discomfort which do not necessarily sound like emergency cardiac etiology, emergency pulmonary etiology, emergency GI etiology, patient started to get slightly more agitated when I suggested that there may not be an indication or benefit to hospitalization which she was looking forward to.  As we discussed a little bit more about all the reassuring findings, patient calm down some.  I did give her Valium to help with anxiety and possible musculoskeletal component.  My impression is more likely anxiety contribution, and I have recommended that she follow-up with her psychiatrist and therapist in addition to keeping the appointment with cardiology after I spoke with on-call cardiologist Dr. Jerelene Redden.  I did add on a d-dimer and I think PE was extremely unlikely.  D-dimer is reassuring in this low risk patient.    CONSULTATIONS: Dr. Jerelene Redden, [facility] cardiology, was able to review patient's ED visit in the [facility] system on Halloween where the patient was diagnosed with musculoskeletal pain.  He reviewed several phone calls in which partner cardiologist had initially prescribed antifungal chores and colchicine for possible pericarditis.  There is a note the patient is recommended to follow-up in the office for an appointment in about a week with Dr. Terance Hart.  He was able to review normal echo in the spring 2019.   Patient / Family / Caregiver informed of clinical course, medical decision-making process, and agree with plan.   I discussed return precautions, follow-up instructions, and discharge instructions with patient and/or family.  Discharge Instructions :You are evaluated for chest discomfort, and  although no certain cause was found, your exam and evaluation are overall  reassuring in the emerge department today.  As we discussed, please follow-up with Dr. Elam Dutch office for discussion of any additional evaluation for the heart.  As we discussed, I also think that anxiety may be contributing to ongoing symptoms, and I would recommend that you also follow-up with your psychiatrist and therapist.  Return to emergency department immediately for any worsening condition including worsening or uncontrolled pain, nausea, dizziness, passing out, weakness, numbness, confusion or altered mental status, or any other symptoms concerning to you.    ___________________________________________   FINAL CLINICAL IMPRESSION(S) / ED DIAGNOSES   Final diagnoses:  Atypical chest pain      ___________________________________________         Note: This dictation was prepared with Dragon dictation. Any transcriptional errors that result from this process are unintentional    Lisa Roca, MD 04/07/18 1404

## 2018-04-07 NOTE — ED Triage Notes (Signed)
Pt c/o having chest pain for the past 2 weeks, states she has been seeing a cardiologist for pericarditis was last see a week ago. States she is not getting any better. Pt is in NAD on arrival

## 2018-04-08 ENCOUNTER — Encounter: Payer: Self-pay | Admitting: Emergency Medicine

## 2018-04-08 ENCOUNTER — Other Ambulatory Visit: Payer: Self-pay

## 2018-04-08 ENCOUNTER — Emergency Department
Admission: EM | Admit: 2018-04-08 | Discharge: 2018-04-08 | Disposition: A | Discharge status: Home / Self Care | Payer: Medicare Other | Attending: Emergency Medicine | Admitting: Emergency Medicine

## 2018-04-08 DIAGNOSIS — R079 Chest pain, unspecified: Secondary | ICD-10-CM

## 2018-04-08 DIAGNOSIS — N189 Chronic kidney disease, unspecified: Secondary | ICD-10-CM | POA: Diagnosis not present

## 2018-04-08 DIAGNOSIS — Z87891 Personal history of nicotine dependence: Secondary | ICD-10-CM | POA: Diagnosis not present

## 2018-04-08 DIAGNOSIS — J45909 Unspecified asthma, uncomplicated: Secondary | ICD-10-CM | POA: Insufficient documentation

## 2018-04-08 LAB — BASIC METABOLIC PANEL
ANION GAP: 8 (ref 5–15)
BUN: 13 mg/dL (ref 6–20)
CALCIUM: 9.3 mg/dL (ref 8.9–10.3)
CO2: 27 mmol/L (ref 22–32)
Chloride: 105 mmol/L (ref 98–111)
Creatinine, Ser: 0.76 mg/dL (ref 0.44–1.00)
Glucose, Bld: 99 mg/dL (ref 70–99)
Potassium: 3.6 mmol/L (ref 3.5–5.1)
Sodium: 140 mmol/L (ref 135–145)

## 2018-04-08 LAB — CBC
HCT: 41.7 % (ref 36.0–46.0)
Hemoglobin: 14 g/dL (ref 12.0–15.0)
MCH: 29.1 pg (ref 26.0–34.0)
MCHC: 33.6 g/dL (ref 30.0–36.0)
MCV: 86.7 fL (ref 80.0–100.0)
NRBC: 0 % (ref 0.0–0.2)
PLATELETS: 227 10*3/uL (ref 150–400)
RBC: 4.81 MIL/uL (ref 3.87–5.11)
RDW: 12.1 % (ref 11.5–15.5)
WBC: 4.8 10*3/uL (ref 4.0–10.5)

## 2018-04-08 LAB — TROPONIN I

## 2018-04-08 NOTE — ED Triage Notes (Signed)
Pt to ED via EMS from home with c/o CP and " heart racing" with dizziness. PT was seen here for same symptoms yesterday and told to follow up with cardiologist and psych/ PT states she called her therapist and talked to her for 1hr 57min with no relief. VSS , NSR for EMS

## 2018-04-08 NOTE — ED Provider Notes (Signed)
[facility] Emergency Department Provider Note       Time seen: ----------------------------------------- 4:13 PM on 04/08/2018 -----------------------------------------   I have reviewed the triage vital signs and the nursing notes.  HISTORY   Chief Complaint Chest Pain    HPI Grace Norris is a 27 y.o. female with a history of anemia, asthma, chronic kidney disease, endometriosis, epilepsy, migraines who presents to the ED for chest pain heart racing with dizziness.  Patient was seen here for symptoms yesterday and was told to follow-up with her cardiologist as well as her therapist.  Patient states she talked to her therapist for over an hour without any improvement.  She denies any recent illness or other complaints.  Past Medical History:  Diagnosis Date  . Anemia   . Asthma    WELL CONTROLLED  . Chronic kidney disease    H/O STONES  . 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 Incidental finding noted on CT scan  . Endometriosis 03/19/2016  . Epilepsy ([place])   . Migraines   . Seizures ([place]) 2016   LAST SEIZURE 1-1.5 YRS AGO  . Sleep apnea    DOES NOT USE CPAP    Patient Active Problem List   Diagnosis Date Noted  . Right ovarian cyst 01/24/2017  . Localization-related focal epilepsy with complex partial seizures ([place]) 10/05/2016  . Left carpal tunnel syndrome 05/14/2016  . Endometriosis 03/19/2016  . Bilateral hand numbness 03/02/2016  . De Quervain's tenosynovitis, bilateral 01/11/2016  . Family history of cancer 10/12/2015  . Pelvic pain in female 10/12/2015    Past Surgical History:  Procedure Laterality Date  . HAND TENDON SURGERY Bilateral 11/2015  . LAPAROSCOPY N/A 03/09/2016   Procedure: LAPAROSCOPY DIAGNOSTIC and D AND C;  Surgeon: Rubie Maid, MD;  Location: [facility] ORS;  Service: Gynecology;  Laterality: N/A;    Allergies Aspartame; Prednisone; Ibuprofen; Latex; Lavender oil; and Penicillins  Social  History Social History   Tobacco Use  . Smoking status: Former Smoker    Packs/day: 0.50    Years: 1.50    Pack years: 0.75    Types: Cigarettes    Last attempt to quit: 03/05/2011    Years since quitting: 7.0  . Smokeless tobacco: Never Used  Substance Use Topics  . Alcohol use: Yes    Comment: occ  . Drug use: No   Review of Systems Constitutional: Negative for fever. Cardiovascular: Positive for chest pain Respiratory: Negative for shortness of breath. Gastrointestinal: Positive for abdominal pain Musculoskeletal: Negative for back pain. Skin: Negative for rash. Neurological: Negative for headaches, focal weakness or numbness.  All systems negative/normal/unremarkable except as stated in the HPI  ____________________________________________   PHYSICAL EXAM:  VITAL SIGNS: ED Triage Vitals  Enc Vitals Group     BP 04/08/18 1536 110/67     Pulse Rate 04/08/18 1536 71     Resp 04/08/18 1536 16     Temp 04/08/18 1536 97.7 F (36.5 C)     Temp Source 04/08/18 1536 Oral     SpO2 04/08/18 1536 98 %     Weight --      Height --      Head Circumference --      Peak Flow --      Pain Score 04/08/18 1528 10     Pain Loc --      Pain Edu? --      Excl. in [place]? --    Constitutional: Alert and oriented. Well  appearing and in no distress. Eyes: Conjunctivae are normal. Normal extraocular movements. ENT   Head: Normocephalic and atraumatic.   Nose: No congestion/rhinnorhea.   Mouth/Throat: Mucous membranes are moist.   Neck: No stridor. Cardiovascular: Normal rate, regular rhythm. No murmurs, rubs, or gallops. Respiratory: Normal respiratory effort without tachypnea nor retractions. Breath sounds are clear and equal bilaterally. No wheezes/rales/rhonchi. Gastrointestinal: Soft and nontender. Normal bowel sounds Musculoskeletal: Nontender with normal range of motion in extremities. No lower extremity tenderness nor edema. Neurologic:  Normal speech and  language. No gross focal neurologic deficits are appreciated.  Skin:  Skin is warm, dry and intact. No rash noted. Psychiatric: Mood and affect are normal. Speech and behavior are normal.  ____________________________________________  EKG: Interpreted by me.  Sinus rhythm the rate of 66 bpm, normal PR interval, normal QRS, normal QT  ____________________________________________  ED COURSE:  As part of my medical decision making, I reviewed the following data within the [place] History obtained from family if available, nursing notes, old chart and ekg, as well as notes from prior ED visits. Patient presented for chest pain with heart racing, we will assess with labs and imaging as indicated at this time.   Procedures ____________________________________________   LABS (pertinent positives/negatives)  Labs Reviewed  BASIC METABOLIC PANEL  CBC  TROPONIN I   ____________________________________________  DIFFERENTIAL DIAGNOSIS   Musculoskeletal pain, anxiety, depression, GERD  FINAL ASSESSMENT AND PLAN  Nonspecific chest pain   Plan: The patient had presented for nonspecific chest pain with her negative work-up yesterday. Patient's labs were unremarkable.  No clear etiology for her pain is been identified.  She is cleared for outpatient follow-up.  Laurence Aly, MD   Note: This note was generated in part or whole with voice recognition software. Voice recognition is usually quite accurate but there are transcription errors that can and very often do occur. I apologize for any typographical errors that were not detected and corrected.     Earleen Newport, MD 04/08/18 [phone]

## 2018-04-08 NOTE — ED Notes (Signed)
First Nurse Note: Pt brought in by [facility] for lightheadedness and chest pain. Pt in NAD.

## 2018-04-14 ENCOUNTER — Emergency Department
Admission: EM | Admit: 2018-04-14 | Discharge: 2018-04-14 | Disposition: A | Discharge status: Home / Self Care | Payer: Medicare Other | Attending: Emergency Medicine | Admitting: Emergency Medicine

## 2018-04-14 ENCOUNTER — Encounter: Payer: Self-pay | Admitting: Emergency Medicine

## 2018-04-14 ENCOUNTER — Other Ambulatory Visit: Payer: Self-pay

## 2018-04-14 ENCOUNTER — Emergency Department: Payer: Medicare Other

## 2018-04-14 DIAGNOSIS — J40 Bronchitis, not specified as acute or chronic: Secondary | ICD-10-CM | POA: Diagnosis not present

## 2018-04-14 DIAGNOSIS — Z87891 Personal history of nicotine dependence: Secondary | ICD-10-CM | POA: Diagnosis not present

## 2018-04-14 DIAGNOSIS — Z79899 Other long term (current) drug therapy: Secondary | ICD-10-CM | POA: Insufficient documentation

## 2018-04-14 DIAGNOSIS — N189 Chronic kidney disease, unspecified: Secondary | ICD-10-CM | POA: Diagnosis not present

## 2018-04-14 DIAGNOSIS — R079 Chest pain, unspecified: Secondary | ICD-10-CM | POA: Diagnosis present

## 2018-04-14 LAB — CBC
HEMATOCRIT: 43.4 % (ref 36.0–46.0)
HEMOGLOBIN: 14.5 g/dL (ref 12.0–15.0)
MCH: 28.5 pg (ref 26.0–34.0)
MCHC: 33.4 g/dL (ref 30.0–36.0)
MCV: 85.3 fL (ref 80.0–100.0)
Platelets: 195 10*3/uL (ref 150–400)
RBC: 5.09 MIL/uL (ref 3.87–5.11)
RDW: 12 % (ref 11.5–15.5)
WBC: 5.6 10*3/uL (ref 4.0–10.5)
nRBC: 0 % (ref 0.0–0.2)

## 2018-04-14 LAB — BASIC METABOLIC PANEL
Anion gap: 9 (ref 5–15)
BUN: 18 mg/dL (ref 6–20)
CHLORIDE: 106 mmol/L (ref 98–111)
CO2: 24 mmol/L (ref 22–32)
Calcium: 9 mg/dL (ref 8.9–10.3)
Creatinine, Ser: 0.73 mg/dL (ref 0.44–1.00)
GFR calc Af Amer: >60 mL/min (ref >60)
GFR calc non Af Amer: >60 mL/min (ref >60)
GLUCOSE: 98 mg/dL (ref 70–99)
Potassium: 4.2 mmol/L (ref 3.5–5.1)
SODIUM: 139 mmol/L (ref 135–145)

## 2018-04-14 LAB — TROPONIN I: Troponin I: <0.03 ng/mL (ref <0.03)

## 2018-04-14 MED ORDER — ALBUTEROL SULFATE HFA 108 (90 BASE) MCG/ACT IN AERS: 2.0000 | INHALATION_SPRAY | Freq: Four times a day (QID) | RESPIRATORY_TRACT | 2 refills | Status: AC | PRN

## 2018-04-14 MED ORDER — AZITHROMYCIN 250 MG PO TABS: ORAL_TABLET | ORAL | 0 refills | Status: DC

## 2018-04-14 MED ORDER — AZITHROMYCIN 500 MG PO TABS
500.0000 mg | ORAL_TABLET | Freq: Once | ORAL | Status: AC
  Administered 2018-04-14: 500 mg via ORAL
  Filled 2018-04-14: qty 1

## 2018-04-14 NOTE — ED Provider Notes (Signed)
[facility] Emergency Department Provider Note  ____________________________________________  Time seen: Approximately 6:51 PM  I have reviewed the triage vital signs and the nursing notes.   HISTORY  Chief Complaint Chest Pain   HPI Grace Norris is a 27 y.o. female with a history of asthma, kidney disease, migraines, and anemia who presents for evaluation of chest pain.  This is patient's fourth visit to a medical provider over the last week for chest pain.  She has seen a cardiologist and to prior emergency visits.  She has had labs and chest x-ray and cardiology consult with no clear etiology for her pain.  She reports that the pain is intermittent, pressure-like, located in the center of her chest.  She is here today because since last night she has had a productive cough, chills, shortness of breath, and congestion and she is afraid that right now her chest pain could be from pneumonia.  She has no fever, no vomiting, no diarrhea, no body aches.  She reports that the pain is mild and pressure-like in the center of her chest.  There is no pain at this time.  She denies history of smoking.  She has had no wheezing. Past Medical History:  Diagnosis Date  . Anemia   . Asthma    WELL CONTROLLED  . Chronic kidney disease    H/O STONES  . 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 Incidental finding noted on CT scan  . Endometriosis 03/19/2016  . Epilepsy ([place])   . Migraines   . Seizures ([place]) 2016   LAST SEIZURE 1-1.5 YRS AGO  . Sleep apnea    DOES NOT USE CPAP    Patient Active Problem List   Diagnosis Date Noted  . Right ovarian cyst 01/24/2017  . 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 Left carpal tunnel syndrome 05/14/2016  . Endometriosis 03/19/2016  . Bilateral hand numbness 03/02/2016  . De Quervain's tenosynovitis, bilateral 01/11/2016  . Family history of cancer 10/12/2015    . Pelvic pain in female 10/12/2015    Past Surgical History:  Procedure Laterality Date  . HAND TENDON SURGERY Bilateral 11/2015  . LAPAROSCOPY N/A 03/09/2016   Procedure: LAPAROSCOPY DIAGNOSTIC and D AND C;  Surgeon: Rubie Maid, MD;  Location: [facility] ORS;  Service: Gynecology;  Laterality: N/A;    Prior to Admission medications   Medication Sig Start Date End Date Taking? Authorizing Provider  acetaminophen (TYLENOL 8 HOUR) 650 MG CR tablet Take 1 tablet (650 mg total) by mouth every 8 (eight) hours as needed for pain (mild). 03/09/16   Rubie Maid, MD  albuterol (PROVENTIL HFA;VENTOLIN HFA) 108 (90 Base) MCG/ACT inhaler Inhale 2 puffs into the lungs every 6 (six) hours as needed for wheezing or shortness of breath. 04/14/18   Rudene Re, MD  azelastine (OPTIVAR) 0.05 % ophthalmic solution azelastine 0.05 % eye drops  INSTILL 1 DROP INTO BOTH EYES TWICE A DAY    [provider]  azithromycin (ZITHROMAX) 250 MG tablet Take 1 a day for 4 days 04/14/18   Alfred Levins, [place], MD  baclofen (LIORESAL) 10 MG tablet TAKE 1/2 TO 1 TABLET BY MOUTH EVERY 8 HOURS AS NEEDED FOR MUSCLE SPASM 06/20/16   [provider]  Biotin w/ Vitamins C & E (HAIR/SKIN/NAILS PO) Take by mouth.    [provider]  busPIRone (BUSPAR) 15 MG tablet buspirone 15 mg tablet    [provider]  butalbital-acetaminophen-caffeine (FIORICET,  ESGIC) 50-325-40 MG tablet butalbital-acetaminophen-caffeine 50 mg-325 mg-40 mg tablet  TAKE 1 CAPSULE BY MOUTH EVERY 4 HOURS AS NEEDED FOR UP TO 3 DAYS    [provider]  clonazePAM (KLONOPIN) 0.5 MG tablet Take by mouth. 01/16/17   [provider]  diclofenac (CATAFLAM) 50 MG tablet TAKE 1 TABLET (50 MG TOTAL) BY MOUTH 2 (TWO) TIMES DAILY AS NEEDED. 10/05/16   [provider]  diclofenac (VOLTAREN) 75 MG EC tablet diclofenac sodium 75 mg tablet,delayed release    [provider]  docusate sodium (COLACE) 100 MG  capsule Take 2 capsules (200 mg total) by mouth 2 (two) times daily. 10/19/16   Carrie Mew, MD  eletriptan (RELPAX) 40 MG tablet Relpax 40 mg tablet    [provider]  folic acid (FOLVITE) 1 MG tablet folic acid 1 mg tablet  TAKE 4 TABLETS BY MOUTH EVERY DAY    [provider]  HYDROcodone-acetaminophen (NORCO/VICODIN) 5-325 MG tablet Take 1-2 tablets by mouth every 4 (four) hours as needed. for pain 01/22/17   Rubie Maid, MD  ibuprofen (ADVIL,MOTRIN) 600 MG tablet Take 1 tablet (600 mg total) by mouth every 6 (six) hours as needed. 01/21/17   Rudene Re, MD  ketorolac (TORADOL) 10 MG tablet Take 1 tablet (10 mg total) by mouth every 6 (six) hours as needed. 10/20/16   Rubie Maid, MD  levETIRAcetam (KEPPRA) 750 MG tablet levetiracetam 750 mg tablet    [provider]  Lifitegrast (XIIDRA) 5 % SOLN INSTILL 1 DROP INTO BOTH EYES TWICE A DAY 07/02/16   [provider]  loteprednol (LOTEMAX) 0.2 % SUSP Apply to eye. 10/06/16   [provider]  loteprednol (LOTEMAX) 0.5 % ophthalmic suspension 1 drop as needed.    [provider]  Multiple Vitamin (MULTI-VITAMINS) TABS Take by mouth.    [provider]  norethindrone (AYGESTIN) 5 MG tablet norethindrone acetate 5 mg tablet    [provider]  nortriptyline (PAMELOR) 10 MG capsule Take by mouth. 06/02/16 06/02/17  [provider]  Olopatadine HCl (PATADAY) 0.2 % SOLN Pataday 0.2 % eye drops    [provider]  pantoprazole (PROTONIX) 40 MG tablet Take 1 tablet (40 mg total) by mouth daily. 09/05/16 09/05/17  Menshew, Dannielle Karvonen, PA-C  polyethylene glycol (MIRALAX) packet Take 17 g by mouth daily. 01/27/17   Rubie Maid, MD  prednisoLONE acetate (PRED FORTE) 1 % ophthalmic suspension prednisolone acetate 1 % eye drops,suspension  1 DROP BOTH EYES 4X/DAY X 7 DAYS, 3X/DAY X 7 DAYS, 2X/DAY X 7 DAYS, 1X/DAY X 7 DAYS    [provider]  promethazine  (PHENERGAN) 25 MG tablet Take by mouth. 10/05/16   [provider]  promethazine (PHENERGAN) 25 MG tablet Take 1 tablet (25 mg total) by mouth every 4 (four) hours as needed for nausea or vomiting. 01/29/17   Earleen Newport, MD  propranolol ER (INDERAL LA) 60 MG 24 hr capsule Take by mouth. 10/05/16 10/05/17  [provider]  ranitidine (ZANTAC) 150 MG tablet ranitidine 150 mg tablet  Take 1 tablet twice a day by oral route before meals.    [provider]  rizatriptan (MAXALT) 10 MG tablet rizatriptan 10 mg tablet    [provider]  senna (SENOKOT) 8.6 MG TABS tablet Take 2 tablets (17.2 mg total) by mouth 2 (two) times daily. 10/19/16   Carrie Mew, MD  simethicone (MYLICON) 80 MG chewable tablet Gas Relief 80 mg chewable tablet  CHEW 1 TABLET (80 MG TOTAL) BY MOUTH EVERY 6 (SIX) HOURS AS NEEDED FOR FLATULENCE.    [provider]  traZODone (DESYREL) 50 MG tablet trazodone 50 mg tablet    [provider]  XIIDRA 5 % SOLN INSTILL 1 DROP INTO BOTH EYES TWICE A DAY 07/02/16   [provider]    Allergies Aspartame; Prednisone; Ibuprofen; Latex; Lavender oil; and Penicillins  Family History  Problem Relation Age of Onset  . Scoliosis Brother   . Fibromyalgia Mother   . Cancer Mother 9       ovarian; s/p partial hysterectomy with RSO  . Graves' disease Mother   . Alzheimer's disease Other   . Cancer Maternal Grandmother 30       breast cancer (unilateral lumpectomy)  . Breast cancer Maternal Grandmother     Social History Social History   Tobacco Use  . Smoking status: Former Smoker    Packs/day: 0.50    Years: 1.50    Pack years: 0.75    Types: Cigarettes    Last attempt to quit: 03/05/2011    Years since quitting: 7.1  . Smokeless tobacco: Never Used  Substance Use Topics  . Alcohol use: Yes    Comment: occ  . Drug use: No    Review of Systems  Constitutional: Negative for fever. + chills Eyes: Negative  for visual changes. ENT: Negative for sore throat. Neck: No neck pain  Cardiovascular: + chest pain. Respiratory: + shortness of breath, cough Gastrointestinal: Negative for abdominal pain, vomiting or diarrhea. Genitourinary: Negative for dysuria. Musculoskeletal: Negative for back pain. Skin: Negative for rash. Neurological: Negative for headaches, weakness or numbness. Psych: No SI or HI  ____________________________________________   PHYSICAL EXAM:  VITAL SIGNS: ED Triage Vitals  Enc Vitals Group     BP 04/14/18 1706 (!) 110/50     Pulse Rate 04/14/18 1706 70     Resp 04/14/18 1706 16     Temp 04/14/18 1706 98.2 F (36.8 C)     Temp Source 04/14/18 1706 Oral     SpO2 04/14/18 1706 100 %     Weight --      Height --      Head Circumference --      Peak Flow --      Pain Score 04/14/18 1705 9     Pain Loc --      Pain Edu? --      Excl. in [place]? --     Constitutional: Alert and oriented. Well appearing and in no apparent distress. HEENT:      Head: Normocephalic and atraumatic.         Eyes: Conjunctivae are normal. Sclera is non-icteric.       Mouth/Throat: Mucous membranes are moist.       Neck: Supple with no signs of meningismus. Cardiovascular: Regular rate and rhythm. No murmurs, gallops, or rubs. 2+ symmetrical distal pulses are present in all extremities. No JVD. Respiratory: Normal respiratory effort. Lungs are clear to auscultation bilaterally. No wheezes, crackles, or rhonchi.  Gastrointestinal: Soft, non tender, and non distended with positive bowel sounds. No rebound or guarding. Musculoskeletal: Nontender with normal range of motion in all extremities. No edema, cyanosis, or erythema of extremities. Neurologic: Normal speech and language. Face is symmetric. Moving all extremities. No gross focal neurologic deficits are appreciated. Skin: Skin is warm, dry and intact. No rash noted. Psychiatric: Mood and affect are normal. Speech and behavior are  normal.  ____________________________________________  LABS (all labs ordered are listed, but only abnormal results are displayed)  Labs Reviewed  BASIC METABOLIC PANEL  CBC  TROPONIN I   ____________________________________________  EKG  ED ECG REPORT I, Rudene Re, the attending physician, personally viewed and interpreted this ECG.  Normal sinus rhythm, rate of 67, normal intervals, normal axis, no ST elevations or depressions.  Normal EKG. ____________________________________________  RADIOLOGY  I have personally reviewed the images performed during this visit and I agree with the Radiologist's read.   Interpretation by Radiologist:  No results found.    ____________________________________________   PROCEDURES  Procedure(s) performed: None Procedures Critical Care performed:  None ____________________________________________   INITIAL IMPRESSION / ASSESSMENT AND PLAN / ED COURSE   27 y.o. female with a history of asthma, kidney disease, migraines, and anemia who presents for evaluation of chest pain, SOB, cough, congestion.  Patient is extremely well-appearing and in no distress, has normal vital signs, lungs are clear to auscultation.  EKG shows no evidence of ischemia or dysrhythmias.  PERC negative.  Chest x-ray with no evidence of pneumothorax or pneumonia.  Patient has been treated for possible pericarditis with NSAIDs and colchicine by her cardiologist for the same pain with no relief.  Her labs are unchanged and normal.  At this time will provide patient with a Z-Pak and albuterol for possible bronchitis. Patient allergic to steroids.  Discussed follow-up with primary care doctor and return precautions.      As part of my medical decision making, I reviewed the following data within the [place] notes reviewed and incorporated, Labs reviewed , EKG interpreted , Old chart reviewed, Radiograph reviewed , Notes from prior ED  visits and [place] Controlled Substance Database    Pertinent labs & imaging results that were available during my care of the patient were reviewed by me and considered in my medical decision making (see chart for details).    ____________________________________________   FINAL CLINICAL IMPRESSION(S) / ED DIAGNOSES  Final diagnoses:  Bronchitis      NEW MEDICATIONS STARTED DURING THIS VISIT:  ED Discharge Orders         Ordered    azithromycin (ZITHROMAX) 250 MG tablet     04/14/18 1857    albuterol (PROVENTIL HFA;VENTOLIN HFA) 108 (90 Base) MCG/ACT inhaler  Every 6 hours PRN     04/14/18 1857           Note:  This document was prepared using Dragon voice recognition software and may include unintentional dictation errors.    Rudene Re, MD 04/14/18 [phone]

## 2018-04-14 NOTE — ED Notes (Signed)
Pt  Awake  And alert and  Oriented   Has been seen multiple times recently for similar symptoms  pt is alert and oriented at this time and is

## 2018-04-14 NOTE — ED Notes (Signed)
Back from  Chest x ray

## 2018-04-14 NOTE — ED Notes (Signed)
Report  Given to [facility]

## 2018-04-14 NOTE — ED Triage Notes (Signed)
PT seen multiple times in ED for same CP, pt states she followed up with a neurologist and cardiologist and was told she may have walking pneumonia. Pt in NAD, VSS

## 2018-07-17 ENCOUNTER — Encounter: Payer: Self-pay | Admitting: Emergency Medicine

## 2018-07-17 ENCOUNTER — Emergency Department: Payer: Medicare Other

## 2018-07-17 ENCOUNTER — Emergency Department
Admission: EM | Admit: 2018-07-17 | Discharge: 2018-07-17 | Discharge status: Against Medical Advice | Payer: Medicare Other | Attending: Emergency Medicine | Admitting: Emergency Medicine

## 2018-07-17 ENCOUNTER — Other Ambulatory Visit: Payer: Self-pay

## 2018-07-17 DIAGNOSIS — J45909 Unspecified asthma, uncomplicated: Secondary | ICD-10-CM | POA: Diagnosis not present

## 2018-07-17 DIAGNOSIS — N189 Chronic kidney disease, unspecified: Secondary | ICD-10-CM | POA: Insufficient documentation

## 2018-07-17 DIAGNOSIS — Z91040 Latex allergy status: Secondary | ICD-10-CM | POA: Insufficient documentation

## 2018-07-17 DIAGNOSIS — R202 Paresthesia of skin: Secondary | ICD-10-CM | POA: Diagnosis not present

## 2018-07-17 DIAGNOSIS — G44229 Chronic tension-type headache, not intractable: Secondary | ICD-10-CM

## 2018-07-17 DIAGNOSIS — Z79899 Other long term (current) drug therapy: Secondary | ICD-10-CM | POA: Insufficient documentation

## 2018-07-17 DIAGNOSIS — G43801 Other migraine, not intractable, with status migrainosus: Secondary | ICD-10-CM | POA: Diagnosis not present

## 2018-07-17 DIAGNOSIS — R200 Anesthesia of skin: Secondary | ICD-10-CM | POA: Diagnosis present

## 2018-07-17 DIAGNOSIS — Z87891 Personal history of nicotine dependence: Secondary | ICD-10-CM | POA: Diagnosis not present

## 2018-07-17 LAB — COMPREHENSIVE METABOLIC PANEL
ALBUMIN: 4 g/dL (ref 3.5–5.0)
ALT: 25 U/L (ref 0–44)
AST: 21 U/L (ref 15–41)
Alkaline Phosphatase: 51 U/L (ref 38–126)
Anion gap: 8 (ref 5–15)
BUN: 14 mg/dL (ref 6–20)
CHLORIDE: 106 mmol/L (ref 98–111)
CO2: 22 mmol/L (ref 22–32)
Calcium: 8.4 mg/dL — ABNORMAL LOW (ref 8.9–10.3)
Creatinine, Ser: 0.51 mg/dL (ref 0.44–1.00)
GFR calc Af Amer: >60 mL/min (ref >60)
Glucose, Bld: 92 mg/dL (ref 70–99)
POTASSIUM: 4 mmol/L (ref 3.5–5.1)
Sodium: 136 mmol/L (ref 135–145)
Total Bilirubin: 0.7 mg/dL (ref 0.3–1.2)
Total Protein: 6.9 g/dL (ref 6.5–8.1)

## 2018-07-17 LAB — DIFFERENTIAL
ABS IMMATURE GRANULOCYTES: 0.02 10*3/uL (ref 0.00–0.07)
Basophils Absolute: 0 10*3/uL (ref 0.0–0.1)
Basophils Relative: 1 %
EOS ABS: 0.1 10*3/uL (ref 0.0–0.5)
Eosinophils Relative: 1 %
IMMATURE GRANULOCYTES: 0 %
Lymphocytes Relative: 23 %
Lymphs Abs: 1.6 10*3/uL (ref 0.7–4.0)
MONOS PCT: 7 %
Monocytes Absolute: 0.4 10*3/uL (ref 0.1–1.0)
Neutro Abs: 4.6 10*3/uL (ref 1.7–7.7)
Neutrophils Relative %: 68 %

## 2018-07-17 LAB — CBC
HCT: 38.5 % (ref 36.0–46.0)
Hemoglobin: 12.8 g/dL (ref 12.0–15.0)
MCH: 28.2 pg (ref 26.0–34.0)
MCHC: 33.2 g/dL (ref 30.0–36.0)
MCV: 84.8 fL (ref 80.0–100.0)
NRBC: 0 % (ref 0.0–0.2)
PLATELETS: 206 10*3/uL (ref 150–400)
RBC: 4.54 MIL/uL (ref 3.87–5.11)
RDW: 12.7 % (ref 11.5–15.5)
WBC: 6.7 10*3/uL (ref 4.0–10.5)

## 2018-07-17 MED ORDER — PROCHLORPERAZINE EDISYLATE 10 MG/2ML IJ SOLN
10.0000 mg | Freq: Once | INTRAMUSCULAR | Status: AC
  Administered 2018-07-17: 10 mg via INTRAVENOUS
  Filled 2018-07-17: qty 2

## 2018-07-17 MED ORDER — DEXAMETHASONE SODIUM PHOSPHATE 10 MG/ML IJ SOLN
10.0000 mg | Freq: Once | INTRAMUSCULAR | Status: AC
  Administered 2018-07-17: 10 mg via INTRAVENOUS
  Filled 2018-07-17: qty 1

## 2018-07-17 MED ORDER — SODIUM CHLORIDE 0.9% FLUSH: 3.0000 mL | Freq: Once | INTRAVENOUS | Status: DC

## 2018-07-17 MED ORDER — MAGNESIUM SULFATE 2 GM/50ML IV SOLN
2.0000 g | Freq: Once | INTRAVENOUS | Status: AC
  Administered 2018-07-17: 2 g via INTRAVENOUS
  Filled 2018-07-17: qty 50

## 2018-07-17 NOTE — ED Notes (Addendum)
Pt insisted on having iv out now. Is getting dressed. Does not want to talk to dr about leaving against medical advise. States her boyfriend is wanting her to go with him to [facility] so when he gets his panic attack meds she can drive him home.

## 2018-07-17 NOTE — ED Provider Notes (Signed)
[facility] Emergency Department Provider Note   ____________________________________________   First MD Initiated Contact with Patient 07/17/18 1218     (approximate)  I have reviewed the triage vital signs and the nursing notes.   HISTORY  Chief Complaint Numbness    HPI Grace Norris is a 28 y.o. female who reports onset of right-sided numbness and weakness 15 minutes prior to arrival here.  Patient reports she is had a migraine for a month.  Patient also has epilepsy.  Patient has no obvious facial droop.  Her arms show no drift.  She says she cannot pick her right leg up off the bed.  Nurse she reports that she ambulated to the bed.  She did this without any difficulty   Past Medical History:  Diagnosis Date  . Anemia   . Asthma    WELL CONTROLLED  . Chronic kidney disease    H/O STONES  . 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 Incidental finding noted on CT scan  . Endometriosis 03/19/2016  . Epilepsy ([place])   . Migraines   . Seizures ([place]) 2016   LAST SEIZURE 1-1.5 YRS AGO  . Sleep apnea    DOES NOT USE CPAP    Patient Active Problem List   Diagnosis Date Noted  . Right ovarian cyst 01/24/2017  . Localization-related focal epilepsy with complex partial seizures ([place]) 10/05/2016  . Left carpal tunnel syndrome 05/14/2016  . Endometriosis 03/19/2016  . Bilateral hand numbness 03/02/2016  . De Quervain's tenosynovitis, bilateral 01/11/2016  . Family history of cancer 10/12/2015  . Pelvic pain in female 10/12/2015    Past Surgical History:  Procedure Laterality Date  . HAND TENDON SURGERY Bilateral 11/2015  . LAPAROSCOPY N/A 03/09/2016   Procedure: LAPAROSCOPY DIAGNOSTIC and D AND C;  Surgeon: Rubie Maid, MD;  Location: [facility] ORS;  Service: Gynecology;  Laterality: N/A;    Prior to Admission medications   Medication Sig Start Date End Date Taking? Authorizing Provider  acetaminophen (TYLENOL 8 HOUR) 650  MG CR tablet Take 1 tablet (650 mg total) by mouth every 8 (eight) hours as needed for pain (mild). 03/09/16   Rubie Maid, MD  albuterol (PROVENTIL HFA;VENTOLIN HFA) 108 (90 Base) MCG/ACT inhaler Inhale 2 puffs into the lungs every 6 (six) hours as needed for wheezing or shortness of breath. 04/14/18   Rudene Re, MD  azelastine (OPTIVAR) 0.05 % ophthalmic solution azelastine 0.05 % eye drops  INSTILL 1 DROP INTO BOTH EYES TWICE A DAY    [provider]  azithromycin (ZITHROMAX) 250 MG tablet Take 1 a day for 4 days 04/14/18   Alfred Levins, [place], MD  baclofen (LIORESAL) 10 MG tablet TAKE 1/2 TO 1 TABLET BY MOUTH EVERY 8 HOURS AS NEEDED FOR MUSCLE SPASM 06/20/16   [provider]  Biotin w/ Vitamins C & E (HAIR/SKIN/NAILS PO) Take by mouth.    [provider]  busPIRone (BUSPAR) 15 MG tablet buspirone 15 mg tablet    [provider]  butalbital-acetaminophen-caffeine (FIORICET, ESGIC) 50-325-40 MG tablet butalbital-acetaminophen-caffeine 50 mg-325 mg-40 mg tablet  TAKE 1 CAPSULE BY MOUTH EVERY 4 HOURS AS NEEDED FOR UP TO 3 DAYS    [provider]  clonazePAM (KLONOPIN) 0.5 MG tablet Take by mouth. 01/16/17   [provider]  diclofenac (CATAFLAM) 50 MG tablet TAKE 1 TABLET (50 MG TOTAL) BY MOUTH 2 (TWO) TIMES DAILY AS NEEDED. 10/05/16   [provider]  diclofenac (VOLTAREN) 75 MG  EC tablet diclofenac sodium 75 mg tablet,delayed release    [provider]  docusate sodium (COLACE) 100 MG capsule Take 2 capsules (200 mg total) by mouth 2 (two) times daily. 10/19/16   Carrie Mew, MD  eletriptan (RELPAX) 40 MG tablet Relpax 40 mg tablet    [provider]  folic acid (FOLVITE) 1 MG tablet folic acid 1 mg tablet  TAKE 4 TABLETS BY MOUTH EVERY DAY    [provider]  HYDROcodone-acetaminophen (NORCO/VICODIN) 5-325 MG tablet Take 1-2 tablets by mouth every 4 (four) hours as needed. for pain 01/22/17    Rubie Maid, MD  ibuprofen (ADVIL,MOTRIN) 600 MG tablet Take 1 tablet (600 mg total) by mouth every 6 (six) hours as needed. 01/21/17   Rudene Re, MD  ketorolac (TORADOL) 10 MG tablet Take 1 tablet (10 mg total) by mouth every 6 (six) hours as needed. 10/20/16   Rubie Maid, MD  levETIRAcetam (KEPPRA) 750 MG tablet levetiracetam 750 mg tablet    [provider]  Lifitegrast (XIIDRA) 5 % SOLN INSTILL 1 DROP INTO BOTH EYES TWICE A DAY 07/02/16   [provider]  loteprednol (LOTEMAX) 0.2 % SUSP Apply to eye. 10/06/16   [provider]  loteprednol (LOTEMAX) 0.5 % ophthalmic suspension 1 drop as needed.    [provider]  Multiple Vitamin (MULTI-VITAMINS) TABS Take by mouth.    [provider]  norethindrone (AYGESTIN) 5 MG tablet norethindrone acetate 5 mg tablet    [provider]  nortriptyline (PAMELOR) 10 MG capsule Take by mouth. 06/02/16 06/02/17  [provider]  Olopatadine HCl (PATADAY) 0.2 % SOLN Pataday 0.2 % eye drops    [provider]  pantoprazole (PROTONIX) 40 MG tablet Take 1 tablet (40 mg total) by mouth daily. 09/05/16 09/05/17  Menshew, Dannielle Karvonen, PA-C  polyethylene glycol (MIRALAX) packet Take 17 g by mouth daily. 01/27/17   Rubie Maid, MD  prednisoLONE acetate (PRED FORTE) 1 % ophthalmic suspension prednisolone acetate 1 % eye drops,suspension  1 DROP BOTH EYES 4X/DAY X 7 DAYS, 3X/DAY X 7 DAYS, 2X/DAY X 7 DAYS, 1X/DAY X 7 DAYS    [provider]  promethazine (PHENERGAN) 25 MG tablet Take by mouth. 10/05/16   [provider]  promethazine (PHENERGAN) 25 MG tablet Take 1 tablet (25 mg total) by mouth every 4 (four) hours as needed for nausea or vomiting. 01/29/17   Earleen Newport, MD  propranolol ER (INDERAL LA) 60 MG 24 hr capsule Take by mouth. 10/05/16 10/05/17  [provider]  ranitidine (ZANTAC) 150 MG tablet ranitidine 150 mg tablet  Take 1 tablet twice a day by oral  route before meals.    [provider]  rizatriptan (MAXALT) 10 MG tablet rizatriptan 10 mg tablet    [provider]  senna (SENOKOT) 8.6 MG TABS tablet Take 2 tablets (17.2 mg total) by mouth 2 (two) times daily. 10/19/16   Carrie Mew, MD  simethicone (MYLICON) 80 MG chewable tablet Gas Relief 80 mg chewable tablet  CHEW 1 TABLET (80 MG TOTAL) BY MOUTH EVERY 6 (SIX) HOURS AS NEEDED FOR FLATULENCE.    [provider]  traZODone (DESYREL) 50 MG tablet trazodone 50 mg tablet    [provider]  XIIDRA 5 % SOLN INSTILL 1 DROP INTO BOTH EYES TWICE A DAY 07/02/16   [provider]    Allergies Aspartame; Prednisone; Ibuprofen; Latex; Lavender oil; and Penicillins  Family History  Problem Relation Age  of Onset  . Scoliosis Brother   . Fibromyalgia Mother   . Cancer Mother 38       ovarian; s/p partial hysterectomy with RSO  . Graves' disease Mother   . Alzheimer's disease Other   . Cancer Maternal Grandmother 30       breast cancer (unilateral lumpectomy)  . Breast cancer Maternal Grandmother     Social History Social History   Tobacco Use  . Smoking status: Former Smoker    Packs/day: 0.50    Years: 1.50    Pack years: 0.75    Types: Cigarettes    Last attempt to quit: 03/05/2011    Years since quitting: 7.3  . Smokeless tobacco: Never Used  Substance Use Topics  . Alcohol use: Yes    Comment: occ  . Drug use: No    Review of Systems  Constitutional: No fever/chills Eyes: No visual changes. ENT: No sore throat. Cardiovascular: Denies chest pain. Respiratory: Denies shortness of breath. Gastrointestinal: No abdominal pain.  No nausea, no vomiting.  No diarrhea.  No constipation. Genitourinary: Negative for dysuria. Musculoskeletal: Negative for back pain. Skin: Negative for rash. Neurological: See HPI  ____________________________________________   PHYSICAL EXAM:  VITAL SIGNS: ED Triage Vitals  Enc Vitals Group       BP      Pulse      Resp      Temp      Temp src      SpO2      Weight      Height      Head Circumference      Peak Flow      Pain Score      Pain Loc      Pain Edu?      Excl. in [place]?     Constitutional: Alert and oriented. Well appearing but looks anxious. Eyes: Conjunctivae are normal. PERRL. EOMI. Head: Atraumatic. Nose: No congestion/rhinnorhea. Mouth/Throat: Mucous membranes are moist.  Oropharynx non-erythematous. Neck: No stridor Cardiovascular: Normal rate, regular rhythm. Grossly normal heart sounds.  Good peripheral circulation. Respiratory: Normal respiratory effort.  No retractions. Lungs CTAB. Gastrointestinal: Soft and nontender. No distention. No abdominal bruits. No CVA tenderness. Musculoskeletal: No lower extremity tenderness nor edema.  No joint effusions. Neurologic:  Normal speech and language.  See HPI for further discussion.  Cranial nerves II through XII are intact.  Visual fields were not checked though again patient seems to be weak in the right hand with her grip but has no drift patient does complain of numbness on the right side of her body.  She says she cannot pick her right leg up off the bed but when I check her strength.  Have her lift her left leg she does push down with her right leg as she is supposed to but when I have her lift her right leg she does not push down to the left at all. Skin:  Skin is warm, dry and intact. No rash noted.   ____________________________________________   LABS (all labs ordered are listed, but only abnormal results are displayed)  Labs Reviewed  COMPREHENSIVE METABOLIC PANEL - Abnormal; Notable for the following components:      Result Value   Calcium 8.4 (*)    All other components within normal limits  CBC  DIFFERENTIAL  APTT  CBG MONITORING, ED   ____________________________________________  EKG   ____________________________________________  RADIOLOGY  ED MD interpretation: CT read as  negative by neurology I reviewed the  film  Official radiology report(s): Ct Head Code Stroke Wo Contrast  Result Date: 07/17/2018 CLINICAL DATA:  Code stroke. Right sided arm numbness beginning 1200 hours. EXAM: CT HEAD WITHOUT CONTRAST TECHNIQUE: Contiguous axial images were obtained from the base of the skull through the vertex without intravenous contrast. COMPARISON:  None. FINDINGS: Brain: Normal appearance without evidence of atrophy, malformation, old or acute infarction, mass lesion, hemorrhage, hydrocephalus or extra-axial collection. Vascular: No abnormal vascular finding. Skull: Normal Sinuses/Orbits: Normal Other: None ASPECTS ([place] Stroke Program Early CT Score) - Ganglionic level infarction (caudate, lentiform nuclei, internal capsule, insula, M1-M3 cortex): 7 - Supraganglionic infarction (M4-M6 cortex): 3 Total score (0-10 with 10 being normal): 10 IMPRESSION: 1. Normal head CT. 2. ASPECTS is 10. 3. These results were called by telephone at the time of interpretation on 07/17/2018 at 12:21 pm to Dr. Rudene Re , who verbally acknowledged these results. Electronically Signed   By: Nelson Chimes M.D.   On: 07/17/2018 12:22    ____________________________________________   PROCEDURES  Procedure(s) performed:   Procedures  Critical Care performed:   ____________________________________________   INITIAL IMPRESSION / ASSESSMENT AND PLAN / ED COURSE  Seen by neurology Dr. Irish Elders.  He if has the same findings I do.  He wants me to treat the migraine she is reporting she has had for month.  We will do so and plan on discharging her as recommended.          ____________________________________________   FINAL CLINICAL IMPRESSION(S) / ED DIAGNOSES  Final diagnoses:  Paresthesia  Other migraine with status migrainosus, not intractable     ED Discharge Orders    None       Note:  This document was prepared using Dragon voice recognition software and may  include unintentional dictation errors.    Nena Polio, MD 07/17/18 1530

## 2018-07-17 NOTE — Progress Notes (Signed)
Neurology:  28 y/o F. With hx of anxiety, migraines. Last known well at 12 PM states she comes in with sudden onset of R sided weakness.   On exam complete le distractible and has full strength. She was able to ambulate without assistance. CTH no acute abnormalities  States has 10/10 headache for the past 1.5 months.   Plan: - Not a stroke but anxiety related.  - for headache 10 decadron and 3g Mg sulfate after which she can be d/c  Leotis Pain

## 2018-07-17 NOTE — Discharge Instructions (Signed)
Symptoms look like they are related to migraine.  Please follow-up with neurology either your own or Dr. Manuella Ghazi or Dr. Melrose Nakayama here in town.  Please return for any further problems.

## 2018-07-17 NOTE — ED Notes (Signed)
Pt states wants to leave now. Does not want to let the magnesium complete or wait for lab results. States her boyfriend is having an anxiety attacks and she needs to go to him.

## 2018-07-17 NOTE — ED Notes (Addendum)
Pt in with stated numbness to rightside. Hx of migraines and anxiety. Pt ambulated to the bed with no ataxia or weakness. With neuro exam states she can't raise her leg and gave no effort to attempt. Neuro exam otherwise unremarkable. Unable to establish iv. Iv team called. Pt requesting I call her boyfriend to tell him whats going on. Gave pt her cell phone so she can speak with him

## 2018-07-17 NOTE — ED Notes (Signed)
FIRST NURSE NOTE: pt presents to ED c/o sudden onset RUE and RLE numbness and weakness starting 15 PTA. Pt states she called her cardiologist who told her to come to ED immediately. Pt also reports migraine headache.

## 2018-07-17 NOTE — Progress Notes (Signed)
CODE STROKE- PHARMACY COMMUNICATION   Time CODE STROKE called/page received:2/16 1239  Time response to CODE STROKE was made (in person or via phone): at 1245  Time Stroke Kit retrieved from [place] (only if needed):n/a  Name of Provider/Nurse contacted:   Past Medical History:  Diagnosis Date  . Anemia   . Asthma    WELL CONTROLLED  . Chronic kidney disease    H/O STONES  . 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 Incidental finding noted on CT scan  . Endometriosis 03/19/2016  . Epilepsy ([place])   . Migraines   . Seizures ([place]) 2016   LAST SEIZURE 1-1.5 YRS AGO  . Sleep apnea    DOES NOT USE CPAP   Prior to Admission medications   Medication Sig Start Date End Date Taking? Authorizing Provider  acetaminophen (TYLENOL 8 HOUR) 650 MG CR tablet Take 1 tablet (650 mg total) by mouth every 8 (eight) hours as needed for pain (mild). 03/09/16   Rubie Maid, MD  albuterol (PROVENTIL HFA;VENTOLIN HFA) 108 (90 Base) MCG/ACT inhaler Inhale 2 puffs into the lungs every 6 (six) hours as needed for wheezing or shortness of breath. 04/14/18   Rudene Re, MD  azelastine (OPTIVAR) 0.05 % ophthalmic solution azelastine 0.05 % eye drops  INSTILL 1 DROP INTO BOTH EYES TWICE A DAY    [provider]  azithromycin (ZITHROMAX) 250 MG tablet Take 1 a day for 4 days 04/14/18   Alfred Levins, [place], MD  baclofen (LIORESAL) 10 MG tablet TAKE 1/2 TO 1 TABLET BY MOUTH EVERY 8 HOURS AS NEEDED FOR MUSCLE SPASM 06/20/16   [provider]  Biotin w/ Vitamins C & E (HAIR/SKIN/NAILS PO) Take by mouth.    [provider]  busPIRone (BUSPAR) 15 MG tablet buspirone 15 mg tablet    [provider]  butalbital-acetaminophen-caffeine (FIORICET, ESGIC) 50-325-40 MG tablet butalbital-acetaminophen-caffeine 50 mg-325 mg-40 mg tablet  TAKE 1 CAPSULE BY MOUTH EVERY 4 HOURS AS NEEDED FOR UP TO 3 DAYS    [provider]  clonazePAM (KLONOPIN) 0.5 MG tablet  Take by mouth. 01/16/17   [provider]  diclofenac (CATAFLAM) 50 MG tablet TAKE 1 TABLET (50 MG TOTAL) BY MOUTH 2 (TWO) TIMES DAILY AS NEEDED. 10/05/16   [provider]  diclofenac (VOLTAREN) 75 MG EC tablet diclofenac sodium 75 mg tablet,delayed release    [provider]  docusate sodium (COLACE) 100 MG capsule Take 2 capsules (200 mg total) by mouth 2 (two) times daily. 10/19/16   Carrie Mew, MD  eletriptan (RELPAX) 40 MG tablet Relpax 40 mg tablet    [provider]  folic acid (FOLVITE) 1 MG tablet folic acid 1 mg tablet  TAKE 4 TABLETS BY MOUTH EVERY DAY    [provider]  HYDROcodone-acetaminophen (NORCO/VICODIN) 5-325 MG tablet Take 1-2 tablets by mouth every 4 (four) hours as needed. for pain 01/22/17   Rubie Maid, MD  ibuprofen (ADVIL,MOTRIN) 600 MG tablet Take 1 tablet (600 mg total) by mouth every 6 (six) hours as needed. 01/21/17   Rudene Re, MD  ketorolac (TORADOL) 10 MG tablet Take 1 tablet (10 mg total) by mouth every 6 (six) hours as needed. 10/20/16   Rubie Maid, MD  levETIRAcetam (KEPPRA) 750 MG tablet levetiracetam 750 mg tablet    [provider]  Lifitegrast (XIIDRA) 5 % SOLN INSTILL 1 DROP INTO BOTH EYES TWICE A DAY 07/02/16   [provider]  loteprednol (LOTEMAX) 0.2 % SUSP Apply to  eye. 10/06/16   [provider]  loteprednol (LOTEMAX) 0.5 % ophthalmic suspension 1 drop as needed.    [provider]  Multiple Vitamin (MULTI-VITAMINS) TABS Take by mouth.    [provider]  norethindrone (AYGESTIN) 5 MG tablet norethindrone acetate 5 mg tablet    [provider]  nortriptyline (PAMELOR) 10 MG capsule Take by mouth. 06/02/16 06/02/17  [provider]  Olopatadine HCl (PATADAY) 0.2 % SOLN Pataday 0.2 % eye drops    [provider]  pantoprazole (PROTONIX) 40 MG tablet Take 1 tablet (40 mg total) by mouth daily. 09/05/16 09/05/17  Menshew, Dannielle Karvonen, PA-C  polyethylene glycol (MIRALAX) packet Take 17 g by mouth daily. 01/27/17   Rubie Maid, MD  prednisoLONE acetate (PRED FORTE) 1 % ophthalmic suspension prednisolone acetate 1 % eye drops,suspension  1 DROP BOTH EYES 4X/DAY X 7 DAYS, 3X/DAY X 7 DAYS, 2X/DAY X 7 DAYS, 1X/DAY X 7 DAYS    [provider]  promethazine (PHENERGAN) 25 MG tablet Take by mouth. 10/05/16   [provider]  promethazine (PHENERGAN) 25 MG tablet Take 1 tablet (25 mg total) by mouth every 4 (four) hours as needed for nausea or vomiting. 01/29/17   Earleen Newport, MD  propranolol ER (INDERAL LA) 60 MG 24 hr capsule Take by mouth. 10/05/16 10/05/17  [provider]  ranitidine (ZANTAC) 150 MG tablet ranitidine 150 mg tablet  Take 1 tablet twice a day by oral route before meals.    [provider]  rizatriptan (MAXALT) 10 MG tablet rizatriptan 10 mg tablet    [provider]  senna (SENOKOT) 8.6 MG TABS tablet Take 2 tablets (17.2 mg total) by mouth 2 (two) times daily. 10/19/16   Carrie Mew, MD  simethicone (MYLICON) 80 MG chewable tablet Gas Relief 80 mg chewable tablet  CHEW 1 TABLET (80 MG TOTAL) BY MOUTH EVERY 6 (SIX) HOURS AS NEEDED FOR FLATULENCE.    [provider]  traZODone (DESYREL) 50 MG tablet trazodone 50 mg tablet    [provider]  XIIDRA 5 % SOLN INSTILL 1 DROP INTO BOTH EYES TWICE A DAY 07/02/16   [provider]    Noralee Space ,PharmD Clinical Pharmacist  07/17/2018  3:39 PM

## 2018-07-17 NOTE — ED Notes (Signed)
Pt signed out ama and ambulated with steady gait to the exit.

## 2018-07-17 NOTE — ED Notes (Signed)
Pt states has to urinate. Ambulated without assistance to the bathroom. Given urine cup but did not give sample.

## 2018-08-22 ENCOUNTER — Telehealth: Payer: Self-pay | Admitting: Obstetrics and Gynecology

## 2018-08-22 NOTE — Telephone Encounter (Signed)
The patient is due in this Thursday at 11 AM, and she is in pain and asking what she can do temporarily until then, please advise, thanks.

## 2018-08-23 ENCOUNTER — Telehealth: Payer: Self-pay

## 2018-08-23 NOTE — Telephone Encounter (Signed)
Spoke with pt today via telephone call. Please see another phone encounter.

## 2018-08-23 NOTE — Telephone Encounter (Signed)
Pt was called to see if she was still going to [facility]. Pt stated that she was not going there anymore after her surgery. Pt stated that the doctor asked her to follow up once a year as needed.

## 2018-08-24 ENCOUNTER — Telehealth: Payer: Self-pay

## 2018-08-24 NOTE — Telephone Encounter (Signed)
Pt was called and went over prescreening for COVID-19. Pt was asked if she had a cough, SOB, difficulty breathing, gastrointerstal issues. Pt stated no to all questions. Pt was also informed that she was not allowed to bring anyone with her to the visit. Pt stated that she understood.

## 2018-08-25 ENCOUNTER — Other Ambulatory Visit: Payer: Self-pay

## 2018-08-25 ENCOUNTER — Ambulatory Visit (INDEPENDENT_AMBULATORY_CARE_PROVIDER_SITE_OTHER): Payer: Medicare Other | Admitting: Obstetrics and Gynecology

## 2018-08-25 ENCOUNTER — Encounter: Payer: Self-pay | Admitting: Obstetrics and Gynecology

## 2018-08-25 VITALS — BP 108/72 | HR 76 | Ht 64.0 in | Wt 157.1 lb

## 2018-08-25 DIAGNOSIS — K219 Gastro-esophageal reflux disease without esophagitis: Secondary | ICD-10-CM | POA: Insufficient documentation

## 2018-08-25 DIAGNOSIS — Z803 Family history of malignant neoplasm of breast: Secondary | ICD-10-CM | POA: Diagnosis not present

## 2018-08-25 DIAGNOSIS — E8940 Asymptomatic postprocedural ovarian failure: Secondary | ICD-10-CM | POA: Diagnosis not present

## 2018-08-25 DIAGNOSIS — Z8742 Personal history of other diseases of the female genital tract: Secondary | ICD-10-CM

## 2018-08-25 DIAGNOSIS — N644 Mastodynia: Secondary | ICD-10-CM | POA: Diagnosis not present

## 2018-08-25 DIAGNOSIS — Z79890 Hormone replacement therapy (postmenopausal): Secondary | ICD-10-CM

## 2018-08-25 DIAGNOSIS — J45909 Unspecified asthma, uncomplicated: Secondary | ICD-10-CM | POA: Insufficient documentation

## 2018-08-25 MED ORDER — MEDROXYPROGESTERONE ACETATE 10 MG PO TABS: 10.0000 mg | ORAL_TABLET | Freq: Every day | ORAL | 6 refills | Status: DC

## 2018-08-25 NOTE — Progress Notes (Signed)
Pt stated that she noticed her both of her breast pain x 3 months. Pt stated that the pain goes all the way down her arms. Pt stated that she was unable to sleep or wear a bra due to the pain. Pt stated that she noticed a lump in both breast.

## 2018-08-25 NOTE — Patient Instructions (Signed)

## 2018-08-25 NOTE — Progress Notes (Signed)
GYNECOLOGY CLINIC PROGRESS NOTE   Subjective:     Grace Norris is an 27 y.o. [id number] female with a h/o endometriosis s/p TLH, and subsequent BSO  due to persistent symptoms ~ 1 year later (performed at [facility]). She presents for evaluation of bilateral breast tenderness x 3 months. Symptoms have been fairly consistent since onset (no worsening in pain).  She also endorses soreness of the nipples bilaterally, and notes that she can sometimes feel "something hard" in her breasts when sitting up (however does not feel when lying down) that is mildy tender.  She expresses concern as she has a significant family history of cancer, including breast. Patient does routinely do self breast exams. Patient denies nipple discharge. Breast cancer risk factors include: family hx on mother's side, nulliparous, menarche before age 12 and family hx of ovarian CA.  She states that she was tested at [facility] ~ 2 years ago before her hysterectomy and was told that she was high risk.   She is surgically menopausal, on HRT.    Past Medical History:  Diagnosis Date  . Anemia   . Asthma    WELL CONTROLLED  . Chronic kidney disease    H/O STONES  . Chronic pain   . Double ureter bilaterally    noted at time of hysterectomy in 2018  . 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 Incidental finding noted on CT scan  . Endometriosis 03/19/2016  . Epilepsy ([place])   . Migraines   . Seizures ([place]) 2016   LAST SEIZURE 1-1.5 YRS AGO  . Sleep apnea    DOES NOT USE CPAP    Family History  Problem Relation Age of Onset  . Scoliosis Brother   . Fibromyalgia Mother   . Cancer Mother 50       ovarian; s/p partial hysterectomy with RSO  . Graves' disease Mother   . Alzheimer's disease Other   . Cancer Maternal Grandmother 30       breast cancer (unilateral lumpectomy)  . Breast cancer Maternal Grandmother   . Endometriosis Sister     Past Surgical History:  Procedure Laterality Date  . HAND  TENDON SURGERY Bilateral 11/2015  . HYSTEROSCOPY    . LAPAROSCOPIC APPENDECTOMY  03/31/2017   performed with removal of ovary, at [facility]  . LAPAROSCOPIC CHOLECYSTECTOMY  10/14/2017   performed at [facility]  . LAPAROSCOPIC ENDOMETRIOSIS FULGURATION     03/2017, and 06/2018  . LAPAROSCOPY N/A 03/09/2016   Procedure: LAPAROSCOPY DIAGNOSTIC and D AND C;  Surgeon: Rubie Maid, MD;  Location: [facility] ORS;  Service: Gynecology;  Laterality: N/A;  . TOTAL LAPAROSCOPIC HYSTERECTOMY WITH BILATERAL SALPINGO OOPHORECTOMY     hysterectomy with unilateral ovary removal on 03/31/2017, removal of remaining ovary in 06/15/2018    The following portions of the patient's history were reviewed and updated as appropriate: allergies, current medications, past family history, past medical history, past social history, past surgical history and problem list.  Review of Systems Pertinent items are noted in HPI. Remainder of review of systems negative.     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  History of endometriosis   Plan:   - Reassured the patient that the finding is most likely benign.   Advised that it was likely due to the fact that she is taking HRT due to surgical menopause on account of menopause(currently on Vivelle-Dot estrogen replacement, but has discontinued her progesterone due to side effects of bloating).   Advised that she would likely notice a change in her symptoms if she resumed use.  Can change from Aygestin to Provera to  decrease side effects, as well as offering for her to start at 1 week per month, and can increase up to daily use as needed until symptoms resolve. Given prescription.  - Patient with family history of cancer, reports being tested and is high risk as well.  Will find report of genetic screening, and if positive, will schedule for patient to have a baseline mammogram once she turns 28 (in a few months) and follow any other recommendations as indicated.    Rubie Maid, MD [facility]

## 2018-10-11 ENCOUNTER — Emergency Department
Admission: EM | Admit: 2018-10-11 | Discharge: 2018-10-11 | Disposition: A | Discharge status: Home / Self Care | Payer: Medicare Other | Attending: Student in an Organized Health Care Education/Training Program | Admitting: Student in an Organized Health Care Education/Training Program

## 2018-10-11 ENCOUNTER — Emergency Department: Payer: Medicare Other

## 2018-10-11 ENCOUNTER — Other Ambulatory Visit: Payer: Self-pay

## 2018-10-11 DIAGNOSIS — J45909 Unspecified asthma, uncomplicated: Secondary | ICD-10-CM | POA: Diagnosis not present

## 2018-10-11 DIAGNOSIS — Z91040 Latex allergy status: Secondary | ICD-10-CM | POA: Insufficient documentation

## 2018-10-11 DIAGNOSIS — R0789 Other chest pain: Secondary | ICD-10-CM | POA: Diagnosis present

## 2018-10-11 DIAGNOSIS — Z79899 Other long term (current) drug therapy: Secondary | ICD-10-CM | POA: Diagnosis not present

## 2018-10-11 DIAGNOSIS — Z87891 Personal history of nicotine dependence: Secondary | ICD-10-CM | POA: Diagnosis not present

## 2018-10-11 LAB — COMPREHENSIVE METABOLIC PANEL
ALT: 22 U/L (ref 0–44)
AST: 20 U/L (ref 15–41)
Albumin: 4.5 g/dL (ref 3.5–5.0)
Alkaline Phosphatase: 49 U/L (ref 38–126)
Anion gap: 9 (ref 5–15)
BUN: 17 mg/dL (ref 6–20)
CO2: 23 mmol/L (ref 22–32)
Calcium: 8.8 mg/dL — ABNORMAL LOW (ref 8.9–10.3)
Chloride: 106 mmol/L (ref 98–111)
Creatinine, Ser: 0.6 mg/dL (ref 0.44–1.00)
GFR calc Af Amer: >60 mL/min (ref >60)
GFR calc non Af Amer: >60 mL/min (ref >60)
Glucose, Bld: 86 mg/dL (ref 70–99)
Potassium: 3.6 mmol/L (ref 3.5–5.1)
Sodium: 138 mmol/L (ref 135–145)
Total Bilirubin: 0.2 mg/dL — ABNORMAL LOW (ref 0.3–1.2)
Total Protein: 7 g/dL (ref 6.5–8.1)

## 2018-10-11 LAB — TROPONIN I: Troponin I: <0.03 ng/mL (ref <0.03)

## 2018-10-11 LAB — CBC WITH DIFFERENTIAL/PLATELET
Abs Immature Granulocytes: 0.02 10*3/uL (ref 0.00–0.07)
Basophils Absolute: 0.1 10*3/uL (ref 0.0–0.1)
Basophils Relative: 1 %
Eosinophils Absolute: 0.1 10*3/uL (ref 0.0–0.5)
Eosinophils Relative: 1 %
HCT: 39.7 % (ref 36.0–46.0)
Hemoglobin: 13.6 g/dL (ref 12.0–15.0)
Immature Granulocytes: 0 %
Lymphocytes Relative: 24 %
Lymphs Abs: 2.2 10*3/uL (ref 0.7–4.0)
MCH: 28.8 pg (ref 26.0–34.0)
MCHC: 34.3 g/dL (ref 30.0–36.0)
MCV: 84.1 fL (ref 80.0–100.0)
Monocytes Absolute: 0.6 10*3/uL (ref 0.1–1.0)
Monocytes Relative: 7 %
Neutro Abs: 6 10*3/uL (ref 1.7–7.7)
Neutrophils Relative %: 67 %
Platelets: 243 10*3/uL (ref 150–400)
RBC: 4.72 MIL/uL (ref 3.87–5.11)
RDW: 12 % (ref 11.5–15.5)
WBC: 9 10*3/uL (ref 4.0–10.5)
nRBC: 0 % (ref 0.0–0.2)

## 2018-10-11 LAB — FIBRIN DERIVATIVES D-DIMER (ARMC ONLY): Fibrin derivatives D-dimer (ARMC): 176.64 ng/mL (FEU) (ref 0.00–499.00)

## 2018-10-11 MED ORDER — LORAZEPAM 0.5 MG PO TABS
0.5000 mg | ORAL_TABLET | Freq: Once | ORAL | Status: AC
  Administered 2018-10-11: 0.5 mg via ORAL
  Filled 2018-10-11: qty 1

## 2018-10-11 NOTE — ED Notes (Signed)
Pt denies taking OTC medication for N/V or CP

## 2018-10-11 NOTE — ED Provider Notes (Signed)
[facility] Emergency Department Provider Note    First MD Initiated Contact with Patient 10/11/18 1817     (approximate)  I have reviewed the triage vital signs and the nursing notes.   HISTORY  Chief Complaint Chest Pain    HPI Grace Norris is a 28 y.o. female below listed past medical history presents the ER for evaluation of chest pain palpitations.  States that she has been having episodes for the past several weeks.  Is being followed by cardiology and is actually currently wearing a Holter monitor.  Does have some midsternal discomfort when she takes a deep breath and is on hormone therapy.  No history of DVT no leg swelling.  Denies any fevers or chills.    Past Medical History:  Diagnosis Date  . Anemia   . Asthma    WELL CONTROLLED  . Chronic kidney disease    H/O STONES  . Chronic pain   . Double ureter bilaterally    noted at time of hysterectomy in 2018  . 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 Incidental finding noted on CT scan  . Endometriosis 03/19/2016  . Epilepsy ([place])   . Migraines   . Seizures ([place]) 2016   LAST SEIZURE 1-1.5 YRS AGO  . Sleep apnea    DOES NOT USE CPAP   Family History  Problem Relation Age of Onset  . Scoliosis Brother   . Fibromyalgia Mother   . Cancer Mother 75       ovarian; s/p partial hysterectomy with RSO  . Graves' disease Mother   . Alzheimer's disease Other   . Cancer Maternal Grandmother 30       breast cancer (unilateral lumpectomy)  . Breast cancer Maternal Grandmother   . Endometriosis Sister    Past Surgical History:  Procedure Laterality Date  . HAND TENDON SURGERY Bilateral 11/2015  . HYSTEROSCOPY    . LAPAROSCOPIC APPENDECTOMY  03/31/2017   performed with removal of ovary, at [facility]  . LAPAROSCOPIC CHOLECYSTECTOMY  10/14/2017   performed at [facility]  . LAPAROSCOPIC ENDOMETRIOSIS FULGURATION     03/2017, and 06/2018  . LAPAROSCOPY N/A 03/09/2016   Procedure:  LAPAROSCOPY DIAGNOSTIC and D AND C;  Surgeon: Rubie Maid, MD;  Location: [facility] ORS;  Service: Gynecology;  Laterality: N/A;  . TOTAL LAPAROSCOPIC HYSTERECTOMY WITH BILATERAL SALPINGO OOPHORECTOMY     hysterectomy with unilateral ovary removal on 03/31/2017, removal of remaining ovary in 06/15/2018   Patient Active Problem List   Diagnosis Date Noted  . Acid reflux 08/25/2018  . Asthma 08/25/2018  . Intractable chronic migraine without aura and with status migrainosus 11/16/2017  . Anxiety, generalized 09/05/2017  . Right ovarian cyst 01/24/2017  . Localization-related focal epilepsy with complex partial seizures ([place]) 10/05/2016  . Left carpal tunnel syndrome 05/14/2016  . Endometriosis 03/19/2016  . Bilateral hand numbness 03/02/2016  . De Quervain's tenosynovitis, bilateral 01/11/2016  . Family history of cancer 10/12/2015  . Pelvic pain in female 10/12/2015      Prior to Admission medications   Medication Sig Start Date End Date Taking? Authorizing Provider  acetaminophen (TYLENOL 8 HOUR) 650 MG CR tablet Take 1 tablet (650 mg total) by mouth every 8 (eight) hours as needed for pain (mild). 03/09/16   Rubie Maid, MD  albuterol (PROVENTIL HFA;VENTOLIN HFA) 108 (90 Base) MCG/ACT inhaler Inhale 2 puffs into the lungs every 6 (six) hours as needed for wheezing or shortness of breath. 04/14/18   Alfred Levins,  [place], MD  azelastine (OPTIVAR) 0.05 % ophthalmic solution azelastine 0.05 % eye drops  INSTILL 1 DROP INTO BOTH EYES TWICE A DAY    [provider]  Biotin w/ Vitamins C & E (HAIR/SKIN/NAILS PO) Take by mouth.    [provider]  butalbital-acetaminophen-caffeine (FIORICET, ESGIC) 50-325-40 MG tablet butalbital-acetaminophen-caffeine 50 mg-325 mg-40 mg tablet  TAKE 1 CAPSULE BY MOUTH EVERY 4 HOURS AS NEEDED FOR UP TO 3 DAYS    [provider]  clonazePAM (KLONOPIN) 0.5 MG tablet Take by mouth. 01/16/17   [provider]  eletriptan (RELPAX) 40  MG tablet Relpax 40 mg tablet    [provider]  estradiol (VIVELLE-DOT) 0.1 MG/24HR patch Place 1 patch onto the skin 2 (two) times a week.    [provider]  ketorolac (TORADOL) 10 MG tablet Take 1 tablet (10 mg total) by mouth every 6 (six) hours as needed. 10/20/16   Rubie Maid, MD  levETIRAcetam (KEPPRA) 750 MG tablet levetiracetam 750 mg tablet    [provider]  medroxyPROGESTERone (PROVERA) 10 MG tablet Take 1 tablet (10 mg total) by mouth daily. Take 1 tablet for 7 days each month. 08/25/18   Rubie Maid, MD  Multiple Vitamin (MULTI-VITAMINS) TABS Take by mouth.    [provider]  nortriptyline (PAMELOR) 10 MG capsule Take by mouth. 06/02/16 06/02/17  [provider]  Olopatadine HCl (PATADAY) 0.2 % SOLN Pataday 0.2 % eye drops    [provider]  pantoprazole (PROTONIX) 40 MG tablet Take 1 tablet (40 mg total) by mouth daily. 09/05/16 09/05/17  Menshew, Dannielle Karvonen, PA-C  polyethylene glycol (MIRALAX) packet Take 17 g by mouth daily. 01/27/17   Rubie Maid, MD  promethazine (PHENERGAN) 25 MG tablet Take 1 tablet (25 mg total) by mouth every 4 (four) hours as needed for nausea or vomiting. 01/29/17   Earleen Newport, MD  propranolol ER (INDERAL LA) 60 MG 24 hr capsule Take by mouth. 10/05/16 10/05/17  [provider]  traZODone (DESYREL) 50 MG tablet trazodone 50 mg tablet    [provider]  XIIDRA 5 % SOLN INSTILL 1 DROP INTO BOTH EYES TWICE A DAY 07/02/16   [provider]    Allergies Aspartame; Penicillins; Latex; Prednisone; Lavender oil; and Oxycodone    Social History Social History   Tobacco Use  . Smoking status: Former Smoker    Packs/day: 0.50    Years: 1.50    Pack years: 0.75    Types: Cigarettes    Last attempt to quit: 03/05/2011    Years since quitting: 7.6  . Smokeless tobacco: Never Used  Substance Use Topics  . Alcohol use: Yes    Comment: occ  . Drug use: No    Review  of Systems Patient denies headaches, rhinorrhea, blurry vision, numbness, shortness of breath, chest pain, edema, cough, abdominal pain, nausea, vomiting, diarrhea, dysuria, fevers, rashes or hallucinations unless otherwise stated above in HPI. ____________________________________________   PHYSICAL EXAM:  VITAL SIGNS: Vitals:   10/11/18 2027 10/11/18 2030  BP:  124/62  Pulse: 68 72  Resp: 18 19  Temp:    SpO2: 97% 97%    Constitutional: Alert and oriented.  Eyes: Conjunctivae are normal.  Head: Atraumatic. Nose: No congestion/rhinnorhea. Mouth/Throat: Mucous membranes are moist.   Neck: No stridor. Painless ROM.  Cardiovascular: Normal rate, regular rhythm. Grossly normal heart sounds.  Good peripheral circulation. Respiratory: Normal respiratory effort.  No retractions. Lungs CTAB. Gastrointestinal: Soft and  nontender. No distention. No abdominal bruits. No CVA tenderness. Genitourinary:  Musculoskeletal: No lower extremity tenderness nor edema.  No joint effusions. Neurologic:  Normal speech and language. No gross focal neurologic deficits are appreciated. No facial droop Skin:  Skin is warm, dry and intact. No rash noted. Psychiatric: Mood and affect are normal. Speech and behavior are normal.  ____________________________________________   LABS (all labs ordered are listed, but only abnormal results are displayed)  Results for orders placed or performed during the hospital encounter of 10/11/18 (from the past 24 hour(s))  CBC with Differential/Platelet     Status: None   Collection Time: 10/11/18  6:52 PM  Result Value Ref Range   WBC 9.0 4.0 - 10.5 K/uL   RBC 4.72 3.87 - 5.11 MIL/uL   Hemoglobin 13.6 12.0 - 15.0 g/dL   HCT 39.7 36.0 - 46.0 %   MCV 84.1 80.0 - 100.0 fL   MCH 28.8 26.0 - 34.0 pg   MCHC 34.3 30.0 - 36.0 g/dL   RDW 12.0 11.5 - 15.5 %   Platelets 243 150 - 400 K/uL   nRBC 0.0 0.0 - 0.2 %   Neutrophils Relative % 67 %   Neutro Abs 6.0 1.7 - 7.7  K/uL   Lymphocytes Relative 24 %   Lymphs Abs 2.2 0.7 - 4.0 K/uL   Monocytes Relative 7 %   Monocytes Absolute 0.6 0.1 - 1.0 K/uL   Eosinophils Relative 1 %   Eosinophils Absolute 0.1 0.0 - 0.5 K/uL   Basophils Relative 1 %   Basophils Absolute 0.1 0.0 - 0.1 K/uL   Immature Granulocytes 0 %   Abs Immature Granulocytes 0.02 0.00 - 0.07 K/uL  Comprehensive metabolic panel     Status: Abnormal   Collection Time: 10/11/18  6:52 PM  Result Value Ref Range   Sodium 138 135 - 145 mmol/L   Potassium 3.6 3.5 - 5.1 mmol/L   Chloride 106 98 - 111 mmol/L   CO2 23 22 - 32 mmol/L   Glucose, Bld 86 70 - 99 mg/dL   BUN 17 6 - 20 mg/dL   Creatinine, Ser 0.60 0.44 - 1.00 mg/dL   Calcium 8.8 (L) 8.9 - 10.3 mg/dL   Total Protein 7.0 6.5 - 8.1 g/dL   Albumin 4.5 3.5 - 5.0 g/dL   AST 20 15 - 41 U/L   ALT 22 0 - 44 U/L   Alkaline Phosphatase 49 38 - 126 U/L   Total Bilirubin 0.2 (L) 0.3 - 1.2 mg/dL   GFR calc non Af Amer >60 >60 mL/min   GFR calc Af Amer >60 >60 mL/min   Anion gap 9 5 - 15  Troponin I - ONCE - STAT     Status: None   Collection Time: 10/11/18  6:52 PM  Result Value Ref Range   Troponin I <0.03 <0.03 ng/mL  Fibrin derivatives D-Dimer ([facility] only)     Status: None   Collection Time: 10/11/18  6:52 PM  Result Value Ref Range   Fibrin derivatives D-dimer ([facility]) 176.64 0.00 - 499.00 ng/mL (FEU)   ____________________________________________  EKG My review and personal interpretation at Time: 18:10   Indication: chest pain  Rate: 80  Rhythm: sinus Axis: normal Other: normal intervals, no wpw or brugada,  ____________________________________________  RADIOLOGY  I personally reviewed all radiographic images ordered to evaluate for the above acute complaints and reviewed radiology reports and findings.  These findings were personally discussed with the patient.  Please see medical  record for radiology report.  ____________________________________________   PROCEDURES   Procedure(s) performed:  Procedures    Critical Care performed: no ____________________________________________   INITIAL IMPRESSION / ASSESSMENT AND PLAN / ED COURSE  Pertinent labs & imaging results that were available during my care of the patient were reviewed by me and considered in my medical decision making (see chart for details).   DDX: ACS, pericarditis, esophagitis, boerhaaves, pe, dissection, pna, bronchitis, costochondritis   Grace Norris is a 28 y.o. who presents to the ED with symptoms as described above.  Symptoms very mild.  Not consistent with dissection, esophagitis, Boerhaave's.  Chest x-ray shows no evidence of pneumothorax or pneumonia.  She is low risk by Wells criteria will order d-dimer to further stratify.  Will check blood work.  Observed in the ER.  No signs of dysrhythmia on telemetry.  Clinical Course as of Oct 11 2115  Tue Oct 11, 2018  2028 Blood work is reassuring.  No evidence of ACS not consistent dissection.  D-dimer is negative.  Not consistent with PE.  Patient admits to significant stressors at home caring for her elderly mother with multiple comorbidities and does feel stressed out.  Do suspect stress contributing to her symptoms.  She stable and appropriate for outpatient follow-up.   [PR]    Clinical Course User Index [PR] Merlyn Lot, MD    The patient was evaluated in Emergency Department today for the symptoms described in the history of present illness. He/she was evaluated in the context of the global COVID-19 pandemic, which necessitated consideration that the patient might be at risk for infection with the SARS-CoV-2 virus that causes COVID-19. Institutional protocols and algorithms that pertain to the evaluation of patients at risk for COVID-19 are in a state of rapid change based on information released by regulatory bodies including the CDC and federal and state organizations. These policies and algorithms were followed during  the patient's care in the ED.  As part of my medical decision making, I reviewed the following data within the [place] notes reviewed and incorporated, Labs reviewed, notes from prior ED visits and [place] Controlled Substance Database   ____________________________________________   FINAL CLINICAL IMPRESSION(S) / ED DIAGNOSES  Final diagnoses:  Atypical chest pain      NEW MEDICATIONS STARTED DURING THIS VISIT:  Discharge Medication List as of 10/11/2018  8:26 PM       Note:  This document was prepared using Dragon voice recognition software and may include unintentional dictation errors.    Merlyn Lot, MD 10/11/18 2118

## 2018-10-11 NOTE — Discharge Instructions (Signed)
Follow up with cardiology

## 2018-10-11 NOTE — ED Triage Notes (Addendum)
Pt was referred to the ED. Pt is currently on a 30 day heart monitor for intermittent chest pain. States she began having chest pain about 30-46min PTA and spoke with the cardiologist that referred her to the ED

## 2018-10-17 ENCOUNTER — Emergency Department: Payer: Medicare Other

## 2018-10-17 ENCOUNTER — Emergency Department
Admission: EM | Admit: 2018-10-17 | Discharge: 2018-10-17 | Disposition: A | Discharge status: Home / Self Care | Payer: Medicare Other | Attending: Emergency Medicine | Admitting: Emergency Medicine

## 2018-10-17 ENCOUNTER — Other Ambulatory Visit: Payer: Self-pay

## 2018-10-17 DIAGNOSIS — Z87891 Personal history of nicotine dependence: Secondary | ICD-10-CM | POA: Diagnosis not present

## 2018-10-17 DIAGNOSIS — J45909 Unspecified asthma, uncomplicated: Secondary | ICD-10-CM | POA: Diagnosis not present

## 2018-10-17 DIAGNOSIS — R079 Chest pain, unspecified: Secondary | ICD-10-CM | POA: Diagnosis not present

## 2018-10-17 DIAGNOSIS — N189 Chronic kidney disease, unspecified: Secondary | ICD-10-CM | POA: Insufficient documentation

## 2018-10-17 DIAGNOSIS — D649 Anemia, unspecified: Secondary | ICD-10-CM | POA: Diagnosis not present

## 2018-10-17 DIAGNOSIS — R000 Tachycardia, unspecified: Secondary | ICD-10-CM | POA: Diagnosis present

## 2018-10-17 LAB — TROPONIN I: Troponin I: <0.03 ng/mL (ref <0.03)

## 2018-10-17 LAB — BASIC METABOLIC PANEL
Anion gap: 7 (ref 5–15)
BUN: 15 mg/dL (ref 6–20)
CO2: 23 mmol/L (ref 22–32)
Calcium: 8.7 mg/dL — ABNORMAL LOW (ref 8.9–10.3)
Chloride: 105 mmol/L (ref 98–111)
Creatinine, Ser: 0.72 mg/dL (ref 0.44–1.00)
GFR calc Af Amer: >60 mL/min (ref >60)
GFR calc non Af Amer: >60 mL/min (ref >60)
Glucose, Bld: 124 mg/dL — ABNORMAL HIGH (ref 70–99)
Potassium: 3.7 mmol/L (ref 3.5–5.1)
Sodium: 135 mmol/L (ref 135–145)

## 2018-10-17 LAB — CBC
HCT: 41.4 % (ref 36.0–46.0)
Hemoglobin: 13.8 g/dL (ref 12.0–15.0)
MCH: 28.4 pg (ref 26.0–34.0)
MCHC: 33.3 g/dL (ref 30.0–36.0)
MCV: 85.2 fL (ref 80.0–100.0)
Platelets: 224 10*3/uL (ref 150–400)
RBC: 4.86 MIL/uL (ref 3.87–5.11)
RDW: 12.1 % (ref 11.5–15.5)
WBC: 5.6 10*3/uL (ref 4.0–10.5)
nRBC: 0 % (ref 0.0–0.2)

## 2018-10-17 MED ORDER — ONDANSETRON 4 MG PO TBDP
4.0000 mg | ORAL_TABLET | Freq: Once | ORAL | Status: AC
  Administered 2018-10-17: 4 mg via ORAL
  Filled 2018-10-17: qty 1

## 2018-10-17 MED ORDER — LORAZEPAM 1 MG PO TABS
1.0000 mg | ORAL_TABLET | Freq: Once | ORAL | Status: AC
  Administered 2018-10-17: 1 mg via ORAL
  Filled 2018-10-17: qty 1

## 2018-10-17 MED ORDER — LORAZEPAM 1 MG PO TABS: 1.0000 mg | ORAL_TABLET | Freq: Two times a day (BID) | ORAL | 0 refills | Status: DC

## 2018-10-17 MED ORDER — SODIUM CHLORIDE 0.9% FLUSH: 3.0000 mL | Freq: Once | INTRAVENOUS | Status: DC

## 2018-10-17 NOTE — ED Triage Notes (Signed)
Pt was seen here 5/12, pt arrive with a 30 day cardiac monitor in  Place. Pt c/o increased CP and left arm numbness today. Pt is in NAD on arrival,. Respirations WNL. Skin is warm and dry

## 2018-10-17 NOTE — ED Provider Notes (Signed)
[facility] Emergency Department Provider Note       Time seen: ----------------------------------------- 11:49 AM on 10/17/2018 -----------------------------------------   I have reviewed the triage vital signs and the nursing notes.  HISTORY   Chief Complaint Chest Pain    HPI Grace Norris is a 28 y.o. female with a history of anemia, asthma, chronic kidney disease, chronic pain, endometriosis, migraines who presents to the ED for tachycardia and chest pain with left arm numbness today.  She arrives in no acute distress with normal vital signs.  Currently she is wearing an event monitor as directed by her cardiologist.  Past Medical History:  Diagnosis Date  . Anemia   . Asthma    WELL CONTROLLED  . Chronic kidney disease    H/O STONES  . Chronic pain   . Double ureter bilaterally    noted at time of hysterectomy in 2018  . 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 Incidental finding noted on CT scan  . Endometriosis 03/19/2016  . Epilepsy ([place])   . Migraines   . Seizures ([place]) 2016   LAST SEIZURE 1-1.5 YRS AGO  . Sleep apnea    DOES NOT USE CPAP    Patient Active Problem List   Diagnosis Date Noted  . Acid reflux 08/25/2018  . Asthma 08/25/2018  . Intractable chronic migraine without aura and with status migrainosus 11/16/2017  . Anxiety, generalized 09/05/2017  . Right ovarian cyst 01/24/2017  . Localization-related focal epilepsy with complex partial seizures ([place]) 10/05/2016  . Left carpal tunnel syndrome 05/14/2016  . Endometriosis 03/19/2016  . Bilateral hand numbness 03/02/2016  . De Quervain's tenosynovitis, bilateral 01/11/2016  . Family history of cancer 10/12/2015  . Pelvic pain in female 10/12/2015    Past Surgical History:  Procedure Laterality Date  . HAND TENDON SURGERY Bilateral 11/2015  . HYSTEROSCOPY    . LAPAROSCOPIC APPENDECTOMY  03/31/2017   performed with removal of ovary, at [facility]  .  LAPAROSCOPIC CHOLECYSTECTOMY  10/14/2017   performed at [facility]  . LAPAROSCOPIC ENDOMETRIOSIS FULGURATION     03/2017, and 06/2018  . LAPAROSCOPY N/A 03/09/2016   Procedure: LAPAROSCOPY DIAGNOSTIC and D AND C;  Surgeon: Rubie Maid, MD;  Location: [facility] ORS;  Service: Gynecology;  Laterality: N/A;  . TOTAL LAPAROSCOPIC HYSTERECTOMY WITH BILATERAL SALPINGO OOPHORECTOMY     hysterectomy with unilateral ovary removal on 03/31/2017, removal of remaining ovary in 06/15/2018    Allergies Aspartame; Penicillins; Latex; Prednisone; Lavender oil; and Oxycodone  Social History Social History   Tobacco Use  . Smoking status: Former Smoker    Packs/day: 0.50    Years: 1.50    Pack years: 0.75    Types: Cigarettes    Last attempt to quit: 03/05/2011    Years since quitting: 7.6  . Smokeless tobacco: Never Used  Substance Use Topics  . Alcohol use: Yes    Comment: occ  . Drug use: No   Review of Systems Constitutional: Negative for fever. Cardiovascular: Positive for chest pain, tachycardia Respiratory: Negative for shortness of breath. Gastrointestinal: Negative for abdominal pain, vomiting and diarrhea. Genitourinary: Negative for dysuria. Musculoskeletal: Negative for back pain. Skin: Negative for rash. Neurological: Positive for left arm numbness  All systems negative/normal/unremarkable except as stated in the HPI  ____________________________________________   PHYSICAL EXAM:  VITAL SIGNS: ED Triage Vitals [10/17/18 1012]  Enc Vitals Group     BP 113/61     Pulse Rate 87     Resp 16  Temp 97.9 F (36.6 C)     Temp Source Oral     SpO2 100 %     Weight 150 lb (68 kg)     Height 5\' 4"  (1.626 m)     Head Circumference      Peak Flow      Pain Score 10     Pain Loc      Pain Edu?      Excl. in [place]?    Constitutional: Alert and oriented. Well appearing and in no distress. Eyes: Conjunctivae are normal. Normal extraocular movements. ENT      Head: Normocephalic and  atraumatic.      Nose: No congestion/rhinnorhea.      Mouth/Throat: Mucous membranes are moist.      Neck: No stridor. Cardiovascular: Normal rate, regular rhythm. No murmurs, rubs, or gallops. Respiratory: Normal respiratory effort without tachypnea nor retractions. Breath sounds are clear and equal bilaterally. No wheezes/rales/rhonchi. Gastrointestinal: Soft and nontender. Normal bowel sounds Musculoskeletal: Nontender with normal range of motion in extremities. No lower extremity tenderness nor edema. Neurologic:  Normal speech and language. No gross focal neurologic deficits are appreciated.  Skin:  Skin is warm, dry and intact. No rash noted. Psychiatric: Mood and affect are normal. Speech and behavior are normal.  ____________________________________________  EKG: Interpreted by me.  Sinus rhythm with rate of 82 bpm, normal PR interval, normal QRS, normal QT  ____________________________________________  ED COURSE:  As part of my medical decision making, I reviewed the following data within the [place] History obtained from family if available, nursing notes, old chart and ekg, as well as notes from prior ED visits. Patient presented for tachycardia and chest pain, we will assess with labs and imaging as indicated at this time.   Procedures  DON GIARRUSSO was evaluated in Emergency Department on 10/17/2018 for the symptoms described in the history of present illness. She was evaluated in the context of the global COVID-19 pandemic, which necessitated consideration that the patient might be at risk for infection with the SARS-CoV-2 virus that causes COVID-19. Institutional protocols and algorithms that pertain to the evaluation of patients at risk for COVID-19 are in a state of rapid change based on information released by regulatory bodies including the CDC and federal and state organizations. These policies and algorithms were followed during the patient's care  in the ED.  ____________________________________________   LABS (pertinent positives/negatives)  Labs Reviewed  BASIC METABOLIC PANEL - Abnormal; Notable for the following components:      Result Value   Glucose, Bld 124 (*)    Calcium 8.7 (*)    All other components within normal limits  CBC  TROPONIN I  POC URINE PREG, ED   ___________________________________________   DIFFERENTIAL DIAGNOSIS   Anxiety, arrhythmia, SVT, hyperthyroidism  FINAL ASSESSMENT AND PLAN  Chest pain, tachycardia   Plan: The patient had presented for chest pain and tachycardia. Patient's labs are reassuring.  We will try Ativan to see if this helps her symptoms, unclear etiology at this time.  She is cleared for outpatient follow-up.   Laurence Aly, MD    Note: This note was generated in part or whole with voice recognition software. Voice recognition is usually quite accurate but there are transcription errors that can and very often do occur. I apologize for any typographical errors that were not detected and corrected.     Earleen Newport, MD 10/17/18 [phone]

## 2018-11-10 ENCOUNTER — Telehealth: Payer: Self-pay

## 2018-11-10 NOTE — Telephone Encounter (Signed)
Pt prescreened no symptoms pt has face mask but unsure if she can find it.    Coronavirus (COVID-19) Are you at risk?  Are you at risk for the Coronavirus (COVID-19)?  To be considered HIGH RISK for Coronavirus (COVID-19), you have to meet the following criteria:  . Traveled to [name], [place], [place], [place] or [place]; or in the [place] to [place], [place], [place], or [place]; and have fever, cough, and shortness of breath within the last 2 weeks of travel OR . Been in close contact with a person diagnosed with COVID-19 within the last 2 weeks and have fever, cough, and shortness of breath . IF YOU DO NOT MEET THESE CRITERIA, YOU ARE CONSIDERED LOW RISK FOR COVID-19.  What to do if you are HIGH RISK for COVID-19?  Marland Kitchen If you are having a medical emergency, call 911. . Seek medical care right away. Before you go to a doctor's office, urgent care or emergency department, call ahead and tell them about your recent travel, contact with someone diagnosed with COVID-19, and your symptoms. You should receive instructions from your physician's office regarding next steps of care.  . When you arrive at healthcare provider, tell the healthcare staff immediately you have returned from visiting [name], [place], [place], [place] or [place]; or traveled in the [place] to [place], [place], [place], or [place]; in the last two weeks or you have been in close contact with a person diagnosed with COVID-19 in the last 2 weeks.   . Tell the health care staff about your symptoms: fever, cough and shortness of breath. . After you have been seen by a medical provider, you will be either: o Tested for (COVID-19) and discharged home on quarantine except to seek medical care if symptoms worsen, and asked to  - Stay home and avoid contact with others until you get your results (4-5 days)  - Avoid travel on public transportation if possible (such as bus, train, or airplane) or o Sent to the  Emergency Department by EMS for evaluation, COVID-19 testing, and possible admission depending on your condition and test results.  What to do if you are LOW RISK for COVID-19?  Reduce your risk of any infection by using the same precautions used for avoiding the common cold or flu:  Marland Kitchen Wash your hands often with soap and warm water for at least 20 seconds.  If soap and water are not readily available, use an alcohol-based hand sanitizer with at least 60% alcohol.  . If coughing or sneezing, cover your mouth and nose by coughing or sneezing into the elbow areas of your shirt or coat, into a tissue or into your sleeve (not your hands). . Avoid shaking hands with others and consider head nods or verbal greetings only. . Avoid touching your eyes, nose, or mouth with unwashed hands.  . Avoid close contact with people who are sick. . Avoid places or events with large numbers of people in one location, like concerts or sporting events. . Carefully consider travel plans you have or are making. . If you are planning any travel outside or inside the [place], visit the CDC's Travelers' Health webpage for the latest health notices. . If you have some symptoms but not all symptoms, continue to monitor at home and seek medical attention if your symptoms worsen. . If you are having a medical emergency, call 911.   [place] /  e-Visit: [email or web address]         [facility]: [facility]: [phone]                   [facility]: [phone]

## 2018-11-11 ENCOUNTER — Encounter: Payer: Self-pay | Admitting: Obstetrics and Gynecology

## 2018-11-11 ENCOUNTER — Other Ambulatory Visit: Payer: Self-pay

## 2018-11-11 ENCOUNTER — Ambulatory Visit (INDEPENDENT_AMBULATORY_CARE_PROVIDER_SITE_OTHER): Payer: Medicare Other | Admitting: Obstetrics and Gynecology

## 2018-11-11 VITALS — BP 104/68 | HR 62 | Ht 64.0 in | Wt 159.5 lb

## 2018-11-11 DIAGNOSIS — R21 Rash and other nonspecific skin eruption: Secondary | ICD-10-CM

## 2018-11-11 DIAGNOSIS — Z8742 Personal history of other diseases of the female genital tract: Secondary | ICD-10-CM | POA: Diagnosis not present

## 2018-11-11 DIAGNOSIS — R339 Retention of urine, unspecified: Secondary | ICD-10-CM

## 2018-11-11 DIAGNOSIS — R109 Unspecified abdominal pain: Secondary | ICD-10-CM | POA: Diagnosis not present

## 2018-11-11 LAB — POCT URINALYSIS DIPSTICK
Bilirubin, UA: NEGATIVE
Blood, UA: NEGATIVE
Glucose, UA: NEGATIVE
Ketones, UA: NEGATIVE
Leukocytes, UA: NEGATIVE
Nitrite, UA: NEGATIVE
Protein, UA: NEGATIVE
Spec Grav, UA: 1.01 (ref 1.010–1.025)
Urobilinogen, UA: 0.2 E.U./dL
pH, UA: 8 (ref 5.0–8.0)

## 2018-11-11 MED ORDER — NITROFURANTOIN MONOHYD MACRO 100 MG PO CAPS: 100.0000 mg | ORAL_CAPSULE | Freq: Two times a day (BID) | ORAL | 1 refills | Status: DC

## 2018-11-11 MED ORDER — HYDROCORTISONE 0.5 % EX CREA: 1.0000 "application " | TOPICAL_CREAM | Freq: Two times a day (BID) | CUTANEOUS | 0 refills | Status: DC

## 2018-11-11 MED ORDER — VALACYCLOVIR HCL 1 G PO TABS: 1000.0000 mg | ORAL_TABLET | Freq: Three times a day (TID) | ORAL | 0 refills | Status: DC

## 2018-11-11 NOTE — Progress Notes (Addendum)
GYNECOLOGY PROGRESS NOTE  Subjective:    Patient ID: Grace Norris, female    DOB: [birth year], 28 y.o.   MRN: [id number]  HPI  Patient is a 28 y.o. G59P0010 female with a h/o hysterectoy (TLH) with BSO for chronic pelvic pain, endometriosis who presents for complaints of left-sided pain that radiates from her back to her abdomen. Pain has been ongoing x 3 days.  Patient notes crampy sensation. Also notes that she hasn't been able to void for almost 24 hrs. Has been drinking water and Gatorade.  Denied any hematuria or dysuria.   Also noting rash on abdomen that began yesterday. Began itching. Reports possible exposure to a child with chicken pox ~ 2 weeks ago. Patient thinks she had chicken pox as a child but cannot remember. Wonders if it could be shingles.  Has been trying to use OTC remedies with no relief.   The following portions of the patient's history were reviewed and updated as appropriate: allergies, current medications, past family history, past medical history, past social history, past surgical history and problem list.  Review of Systems Pertinent items noted in HPI and remainder of comprehensive ROS otherwise negative.   Objective:   Blood pressure 104/68, pulse 62, height 5\' 4"  (1.626 m), weight 159 lb 8 oz (72.3 kg), last menstrual period 12/26/2016. General appearance: alert and no distress Abdomen: normal findings: bowel sounds normal and no masses palpable and abnormal findings:  mild tenderness in lower abdomen.  Pelvic: external genitalia normal, rectovaginal septum normal. Internal pelvic exam not performed.  Back: symmetric, no curvature. ROM normal. Mild left CVA tenderness present.  Neurologic: Alert and oriented X 3, normal strength and tone. Normal symmetric reflexes. Normal coordination and gait   Labs:  Results for orders placed or performed in visit on 11/11/18  POCT Urinalysis Dipstick  Result Value Ref Range   Color, UA yellow    Clarity, UA  cloudy with white material    Glucose, UA Negative Negative   Bilirubin, UA neg    Ketones, UA neg    Spec Grav, UA 1.010 1.010 - 1.025   Blood, UA neg    pH, UA 8.0 5.0 - 8.0   Protein, UA Negative Negative   Urobilinogen, UA 0.2 0.2 or 1.0 E.U./dL   Nitrite, UA neg    Leukocytes, UA Negative Negative   Appearance yellow    Odor       Assessment:   Urinary retention  Flank pain  History of endometriosis Abdominal rash  Plan:   1. Urinary retention - patient reports inability to void for ~ 24 hrs, despite fluid intake. Performed straight catheterization today, with noted debris and tea coloring of urine.  Will send for culture.  2. Patient with flank pain, urinary retention. Denies prior h/o kidney stones. Notes pain is ~ 4-5/10 when it occurs. Symptoms consistent with UTI despite recent UA. Will still treat for suspected UTI. Advised on continuing to increase hydration, can use Azo for symptoms. Prescription given for Macrobid.  3. H/o endometriosis, s/p hysterectomy with BSO. If pain does not subside with current treatment, may need to consider possible endometrioisis bladder implants. Patient currently on HRT, may need to discontinue estrogen component as it could be causing possible flare.  4. Abdominal rash - possibly due to adhesive (has been using tape to keep her pain patches in place, vs possible shingles). Will give prescription for Valtrex and Hydrocortisone cream. To return if symptoms persist or do not improve.  Rubie Maid, MD [facility]

## 2018-11-11 NOTE — Patient Instructions (Signed)
Acute Urinary Retention, Female    Acute urinary retention means that you cannot pee (urinate) at all, or that you pee too little and your bladder is not emptied completely. If it is not treated, it can lead to kidney damage or other serious problems.  Follow these instructions at home:   Take over-the-counter and prescription medicines only as told by your doctor. Ask your doctor what medicines you should stay away from. Do not take any medicine unless your doctor says it is okay to do so.   If you were sent home with a tube that drains pee from the bladder (catheter), take care of it as told by your doctor.   Drink enough fluid to keep your pee clear or pale yellow.   If you were given an antibiotic, take it as told by your doctor. Do not stop taking the antibiotic even if you start to feel better.   Do not use any products that contain nicotine or tobacco, such as cigarettes and e-cigarettes. If you need help quitting, ask your doctor.   Watch for changes in your symptoms. Tell your doctor about them.   If told, keep track of any changes in your blood pressure at home. Tell your doctor about them.   Keep all follow-up visits as told by your doctor. This is important.  Contact a doctor if:   You have spasms or you leak pee when you have spasms.  Get help right away if:   You have chills or a fever.   You have blood in your pee.   You have a tube that drains the bladder and:  ? The tube stops draining pee.  ? The tube falls out.  Summary   Acute urinary retention means that you cannot pee at all, or that you pee too little and your bladder is not emptied completely. If it is not treated, it can result in kidney damage or other serious problems.   If you were sent home with a tube that drains pee from the bladder, take care of it as told by your doctor.   Pay attention to any changes in your symptoms. Tell your doctor about them.  This information is not intended to replace advice given to you by your  health care provider. Make sure you discuss any questions you have with your health care provider.  Document Released: 11/04/2007 Document Revised: 06/19/2016 Document Reviewed: 06/19/2016  Elsevier Interactive Patient Education  2019 [employer]

## 2018-11-11 NOTE — Progress Notes (Signed)
Pt is present today due to having lower left abdominal pain x 3 days.

## 2018-11-13 LAB — URINE CULTURE: Organism ID, Bacteria: NO GROWTH

## 2018-11-20 ENCOUNTER — Other Ambulatory Visit: Payer: Self-pay | Admitting: Obstetrics and Gynecology

## 2018-11-29 ENCOUNTER — Encounter: Payer: Medicare Other | Admitting: Obstetrics and Gynecology

## 2019-02-14 ENCOUNTER — Encounter: Payer: Self-pay | Admitting: Emergency Medicine

## 2019-02-14 ENCOUNTER — Other Ambulatory Visit: Payer: Self-pay

## 2019-02-14 ENCOUNTER — Emergency Department
Admission: EM | Admit: 2019-02-14 | Discharge: 2019-02-14 | Discharge status: Against Medical Advice | Payer: Medicare Other | Attending: Emergency Medicine | Admitting: Emergency Medicine

## 2019-02-14 DIAGNOSIS — R51 Headache: Secondary | ICD-10-CM | POA: Diagnosis not present

## 2019-02-14 DIAGNOSIS — Z5321 Procedure and treatment not carried out due to patient leaving prior to being seen by health care provider: Secondary | ICD-10-CM | POA: Insufficient documentation

## 2019-02-14 DIAGNOSIS — R200 Anesthesia of skin: Secondary | ICD-10-CM | POA: Diagnosis present

## 2019-02-14 LAB — COMPREHENSIVE METABOLIC PANEL
ALT: 42 U/L (ref 0–44)
AST: 25 U/L (ref 15–41)
Albumin: 4.7 g/dL (ref 3.5–5.0)
Alkaline Phosphatase: 63 U/L (ref 38–126)
Anion gap: 9 (ref 5–15)
BUN: 12 mg/dL (ref 6–20)
CO2: 25 mmol/L (ref 22–32)
Calcium: 9.4 mg/dL (ref 8.9–10.3)
Chloride: 104 mmol/L (ref 98–111)
Creatinine, Ser: 0.6 mg/dL (ref 0.44–1.00)
GFR calc Af Amer: >60 mL/min (ref >60)
GFR calc non Af Amer: >60 mL/min (ref >60)
Glucose, Bld: 101 mg/dL — ABNORMAL HIGH (ref 70–99)
Potassium: 4 mmol/L (ref 3.5–5.1)
Sodium: 138 mmol/L (ref 135–145)
Total Bilirubin: 0.7 mg/dL (ref 0.3–1.2)
Total Protein: 7.7 g/dL (ref 6.5–8.1)

## 2019-02-14 LAB — URINALYSIS, ROUTINE W REFLEX MICROSCOPIC
Bilirubin Urine: NEGATIVE
Glucose, UA: NEGATIVE mg/dL
Hgb urine dipstick: NEGATIVE
Ketones, ur: NEGATIVE mg/dL
Leukocytes,Ua: NEGATIVE
Nitrite: NEGATIVE
Protein, ur: NEGATIVE mg/dL
Specific Gravity, Urine: 1.005 (ref 1.005–1.030)
pH: 8 (ref 5.0–8.0)

## 2019-02-14 LAB — CBC
HCT: 43.2 % (ref 36.0–46.0)
Hemoglobin: 14.6 g/dL (ref 12.0–15.0)
MCH: 29 pg (ref 26.0–34.0)
MCHC: 33.8 g/dL (ref 30.0–36.0)
MCV: 85.9 fL (ref 80.0–100.0)
Platelets: 238 10*3/uL (ref 150–400)
RBC: 5.03 MIL/uL (ref 3.87–5.11)
RDW: 12.2 % (ref 11.5–15.5)
WBC: 5.6 10*3/uL (ref 4.0–10.5)
nRBC: 0 % (ref 0.0–0.2)

## 2019-02-14 NOTE — ED Triage Notes (Signed)
Pt here with c/o "numbness from head to toe" that came on suddenly today while she was peeing, felt it start with her head and move all the way down. Hx of epilepsy, migraine disorder, insomnia and sleep apnea. States she is taking thera flu for the flu, has not been flu tested, has been Covid tested 3 times with negative results. C/o headache in triage.

## 2019-02-17 ENCOUNTER — Encounter: Payer: Medicare Other | Admitting: Obstetrics and Gynecology

## 2019-04-23 ENCOUNTER — Encounter: Payer: Self-pay | Admitting: Emergency Medicine

## 2019-04-23 ENCOUNTER — Other Ambulatory Visit: Payer: Self-pay

## 2019-04-23 ENCOUNTER — Emergency Department: Payer: Medicare Other

## 2019-04-23 ENCOUNTER — Emergency Department
Admission: EM | Admit: 2019-04-23 | Discharge: 2019-04-23 | Disposition: A | Discharge status: Home / Self Care | Payer: Medicare Other | Attending: Emergency Medicine | Admitting: Emergency Medicine

## 2019-04-23 DIAGNOSIS — Z87891 Personal history of nicotine dependence: Secondary | ICD-10-CM | POA: Diagnosis not present

## 2019-04-23 DIAGNOSIS — U071 COVID-19: Secondary | ICD-10-CM | POA: Diagnosis not present

## 2019-04-23 DIAGNOSIS — Z79899 Other long term (current) drug therapy: Secondary | ICD-10-CM | POA: Diagnosis not present

## 2019-04-23 DIAGNOSIS — Z91040 Latex allergy status: Secondary | ICD-10-CM | POA: Diagnosis not present

## 2019-04-23 DIAGNOSIS — R0602 Shortness of breath: Secondary | ICD-10-CM | POA: Diagnosis present

## 2019-04-23 DIAGNOSIS — J45909 Unspecified asthma, uncomplicated: Secondary | ICD-10-CM | POA: Diagnosis not present

## 2019-04-23 MED ORDER — DEXAMETHASONE SODIUM PHOSPHATE 10 MG/ML IJ SOLN
10.0000 mg | Freq: Once | INTRAMUSCULAR | Status: AC
  Administered 2019-04-23: 10 mg via INTRAMUSCULAR
  Filled 2019-04-23: qty 1

## 2019-04-23 MED ORDER — PROMETHAZINE-CODEINE 6.25-10 MG/5ML PO SYRP: 5.0000 mL | ORAL_SOLUTION | Freq: Four times a day (QID) | ORAL | 0 refills | Status: DC | PRN

## 2019-04-23 NOTE — ED Notes (Signed)
Pt verbalized understanding of discharge instructions. NAD at this time. 

## 2019-04-23 NOTE — ED Provider Notes (Signed)
[facility] Emergency Department Provider Note  ____________________________________________  Time seen: Approximately 7:26 PM  I have reviewed the triage vital signs and the nursing notes.   HISTORY  Chief Complaint Shortness of Breath   HPI Grace Norris is a 28 y.o. female who presents to emergency department for treatment and evaluation of shortness of breath with nausea, vomiting, and diarrhea. She is COVID-19 positive per her report. Symptoms worse today. She is using albuterol for shortness of breath with little relief.    Past Medical History:  Diagnosis Date  . Anemia   . Asthma    WELL CONTROLLED  . Chronic kidney disease    H/O STONES  . Chronic pain   . Double ureter bilaterally    noted at time of hysterectomy in 2018  . 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 Incidental finding noted on CT scan  . Endometriosis 03/19/2016  . Epilepsy ([place])   . Migraines   . Seizures ([place]) 2016   LAST SEIZURE 1-1.5 YRS AGO  . Sleep apnea    DOES NOT USE CPAP    Patient Active Problem List   Diagnosis Date Noted  . Acid reflux 08/25/2018  . Asthma 08/25/2018  . Intractable chronic migraine without aura and with status migrainosus 11/16/2017  . Anxiety, generalized 09/05/2017  . Right ovarian cyst 01/24/2017  . 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 Left carpal tunnel syndrome 05/14/2016  . Endometriosis 03/19/2016  . Bilateral hand numbness 03/02/2016  . De Quervain's tenosynovitis, bilateral 01/11/2016  . Family history of cancer 10/12/2015  . Pelvic pain in female 10/12/2015    Past Surgical History:  Procedure Laterality Date  . HAND TENDON SURGERY Bilateral 11/2015  . HYSTEROSCOPY    . LAPAROSCOPIC APPENDECTOMY  03/31/2017   performed with removal of ovary, at [facility]  . LAPAROSCOPIC CHOLECYSTECTOMY  10/14/2017   performed at [facility]  . LAPAROSCOPIC ENDOMETRIOSIS  FULGURATION     03/2017, and 06/2018  . LAPAROSCOPY N/A 03/09/2016   Procedure: LAPAROSCOPY DIAGNOSTIC and D AND C;  Surgeon: Rubie Maid, MD;  Location: [facility] ORS;  Service: Gynecology;  Laterality: N/A;  . TOTAL LAPAROSCOPIC HYSTERECTOMY WITH BILATERAL SALPINGO OOPHORECTOMY     hysterectomy with unilateral ovary removal on 03/31/2017, removal of remaining ovary in 06/15/2018    Prior to Admission medications   Medication Sig Start Date End Date Taking? Authorizing Provider  acetaminophen (TYLENOL 8 HOUR) 650 MG CR tablet Take 1 tablet (650 mg total) by mouth every 8 (eight) hours as needed for pain (mild). 03/09/16   Rubie Maid, MD  albuterol (PROVENTIL HFA;VENTOLIN HFA) 108 (90 Base) MCG/ACT inhaler Inhale 2 puffs into the lungs every 6 (six) hours as needed for wheezing or shortness of breath. 04/14/18   Rudene Re, MD  azelastine (OPTIVAR) 0.05 % ophthalmic solution azelastine 0.05 % eye drops  INSTILL 1 DROP INTO BOTH EYES TWICE A DAY    [provider]  Biotin w/ Vitamins C & E (HAIR/SKIN/NAILS PO) Take by mouth.    [provider]  butalbital-acetaminophen-caffeine (FIORICET, ESGIC) 50-325-40 MG tablet butalbital-acetaminophen-caffeine 50 mg-325 mg-40 mg tablet  TAKE 1 CAPSULE BY MOUTH EVERY 4 HOURS AS NEEDED FOR UP TO 3 DAYS    [provider]  clonazePAM (KLONOPIN) 0.5 MG tablet Take by mouth. 01/16/17   [provider]  estradiol (VIVELLE-DOT) 0.1 MG/24HR patch Place 1 patch onto the skin 2 (two) times a week.    [provider]  hydrocortisone cream 0.5 % Apply 1 application topically 2 (two) times daily. 11/11/18   Rubie Maid, MD  levETIRAcetam (KEPPRA) 750 MG tablet levetiracetam 750 mg tablet    [provider]  LORazepam (ATIVAN) 1 MG tablet Take 1 tablet (1 mg total) by mouth 2 (two) times daily. 10/17/18 10/17/19  Earleen Newport, MD  medroxyPROGESTERone (PROVERA) 10 MG tablet Take 1 tablet (10 mg total) by mouth  daily. Take 1 tablet for 7 days each month. 08/25/18   Rubie Maid, MD  Multiple Vitamin (MULTI-VITAMINS) TABS Take by mouth.    [provider]  nitrofurantoin, macrocrystal-monohydrate, (MACROBID) 100 MG capsule Take 1 capsule (100 mg total) by mouth 2 (two) times daily. 11/11/18   Rubie Maid, MD  nortriptyline (PAMELOR) 10 MG capsule Take by mouth. 06/02/16 11/12/18  [provider]  polyethylene glycol (MIRALAX) packet Take 17 g by mouth daily. 01/27/17   Rubie Maid, MD  promethazine-codeine [facility] WITH CODEINE) 6.25-10 MG/5ML syrup Take 5 mLs by mouth every 6 (six) hours as needed for cough. 04/23/19   Tangelia Sanson, Johnette Abraham B, FNP  traZODone (DESYREL) 50 MG tablet 100 mg.     [provider]  XIIDRA 5 % SOLN INSTILL 1 DROP INTO BOTH EYES TWICE A DAY 07/02/16   [provider]  promethazine (PHENERGAN) 25 MG tablet Take 1 tablet (25 mg total) by mouth every 4 (four) hours as needed for nausea or vomiting. 01/29/17 04/23/19  Earleen Newport, MD    Allergies Aspartame, Penicillins, Latex, Prednisone, Lavender oil, and Oxycodone  Family History  Problem Relation Age of Onset  . Scoliosis Brother   . Fibromyalgia Mother   . Cancer Mother 33       ovarian; s/p partial hysterectomy with RSO  . Graves' disease Mother   . Alzheimer's disease Other   . Cancer Maternal Grandmother 30       breast cancer (unilateral lumpectomy)  . Breast cancer Maternal Grandmother   . Endometriosis Sister     Social History Social History   Tobacco Use  . Smoking status: Former Smoker    Packs/day: 0.50    Years: 1.50    Pack years: 0.75    Types: Cigarettes    Quit date: 03/05/2011    Years since quitting: 8.1  . Smokeless tobacco: Never Used  Substance Use Topics  . Alcohol use: Yes    Comment: occ  . Drug use: No    Review of Systems Constitutional: Positive for fever/chills. Decreased appetite. ENT: Positive for sore throat. Cardiovascular: Denies  chest pain. Respiratory: Positive for shortness of breath. Positive  for cough. Negative for  wheezing.  Gastrointestinal: Positive for nausea,  positiver for vomiting.  + diarrhea.  Musculoskeletal: + for body aches Skin: Negative for rash. Neurological: Negative for headaches ____________________________________________   PHYSICAL EXAM:  VITAL SIGNS: ED Triage Vitals [04/23/19 1238]  Enc Vitals Group     BP 122/71     Pulse Rate 88     Resp 16     Temp 98.1 F (36.7 C)     Temp Source Oral     SpO2 99 %     Weight 155 lb (70.3 kg)     Height 5\' 4"  (1.626 m)     Head Circumference      Peak Flow      Pain Score 4     Pain Loc      Pain Edu?      Excl.  in [place]?     Constitutional: Alert and oriented. Overall well appearing and in no acute distress. Eyes: Conjunctivae are normal. Ears: Bilateral TM normal Nose: No sinus congestion noted; no rhinnorhea. Mouth/Throat: Mucous membranes are moist.  Oropharynx mildly erythematous. Tonsils normal. Uvula midline. Neck: No stridor.  Lymphatic: No cervical lymphadenopathy. Cardiovascular: Normal rate, regular rhythm. Good peripheral circulation. Respiratory: Respirations are even and unlabored.  No retractions. Breath sounds clear. Gastrointestinal: Soft and nontender.  Musculoskeletal: FROM x 4 extremities.  Neurologic:  Normal speech and language. Skin:  Skin is warm, dry and intact. No rash noted. Psychiatric: Mood and affect are normal. Speech and behavior are normal.  ____________________________________________   LABS (all labs ordered are listed, but only abnormal results are displayed)  Labs Reviewed - No data to display ____________________________________________  EKG  Not indicated. ____________________________________________  RADIOLOGY  Chest x-ray is negative for acute abnormality per radiology. ____________________________________________   PROCEDURES  Procedure(s) performed: None  Critical Care  performed: No ____________________________________________   INITIAL IMPRESSION / ASSESSMENT AND PLAN / ED COURSE  28 y.o. female presents to the emergency department for treatment and evaluation of symptoms described in HPI.  Exam and chest x-ray are reassuring. She is to follow up with her PCP if not improving over the week. She states that she has a follow up COVID test next week in hopes that she can have her bladder procedure.   She was given Decadron IM while here and will receive a prescription for Robitussin AC.   She is to return to the ER for symptoms that change or worsen if unable to schedule an appointment.   Medications  dexamethasone (DECADRON) injection 10 mg (10 mg Intramuscular Given 04/23/19 1455)    ED Discharge Orders         Ordered    promethazine-codeine (PHENERGAN WITH CODEINE) 6.25-10 MG/5ML syrup  Every 6 hours PRN     04/23/19 1450           Pertinent labs & imaging results that were available during my care of the patient were reviewed by me and considered in my medical decision making (see chart for details).    If controlled substance prescribed during this visit, 12 month history viewed on the [facility] prior to issuing an initial prescription for Schedule II or III opiod. ____________________________________________   FINAL CLINICAL IMPRESSION(S) / ED DIAGNOSES  Final diagnoses:  COVID-19  SOB (shortness of breath)    Note:  This document was prepared using Dragon voice recognition software and may include unintentional dictation errors.    Victorino Dike, FNP 04/23/19 1949    Duffy Bruce, MD 04/24/19 [phone]

## 2019-04-23 NOTE — Discharge Instructions (Signed)
Please keep your scheduled follow-up appointments.  If your symptoms change or worsen and you are unable to see primary care, return to the emergency department.

## 2019-04-23 NOTE — ED Triage Notes (Signed)
Pt is here EMS from home with c/o SOB, N/V/D, states she was tested positive for covid when she tested pre procedure due to having endometriosis of the bladder. Unable to local those results states she had it at [facility], nothing noted under [facility] or [facility] noted.

## 2019-05-22 ENCOUNTER — Ambulatory Visit
Admission: EM | Admit: 2019-05-22 | Discharge: 2019-05-22 | Disposition: A | Discharge status: Home / Self Care | Payer: Medicare Other | Attending: Family Medicine | Admitting: Family Medicine

## 2019-05-22 ENCOUNTER — Encounter: Payer: Self-pay | Admitting: Emergency Medicine

## 2019-05-22 ENCOUNTER — Other Ambulatory Visit: Payer: Self-pay

## 2019-05-22 ENCOUNTER — Ambulatory Visit: Payer: Medicare Other

## 2019-05-22 DIAGNOSIS — S93402A Sprain of unspecified ligament of left ankle, initial encounter: Secondary | ICD-10-CM | POA: Diagnosis present

## 2019-05-22 DIAGNOSIS — W000XXA Fall on same level due to ice and snow, initial encounter: Secondary | ICD-10-CM

## 2019-05-22 MED ORDER — MELOXICAM 15 MG PO TABS: 15.0000 mg | ORAL_TABLET | Freq: Every day | ORAL | 0 refills | Status: DC | PRN

## 2019-05-22 NOTE — ED Provider Notes (Signed)
[facility]    CSN: [id number] Arrival date & time: 05/22/19  1658   History   Chief Complaint Chief Complaint  Patient presents with  . Ankle Pain    left   HPI  28 year old female presents with ankle pain and foot pain.  Patient reports that she suffered a fall on a slippery surface/icy surface yesterday.  Patient reports that she suffered an inversion injury of the left ankle.  She reports left ankle and foot pain.  Pain is severe, 10/10 in severity.  Worse with activity.  She is currently wearing a boot.  She states that it has not helped her pain.  She states that it is too big.  No other interventions tried.  Exacerbated by activity and weightbearing.  No relieving factors.  No other complaints.  PMH, Surgical Hx, Family Hx, Social History reviewed and updated as below.  Past Medical History:  Diagnosis Date  . Anemia   . Asthma    WELL CONTROLLED  . Chronic kidney disease    H/O STONES  . Chronic pain   . Double ureter bilaterally    noted at time of hysterectomy in 2018  . 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 Incidental finding noted on CT scan  . Endometriosis 03/19/2016  . Epilepsy ([place])   . Migraines   . Seizures ([place]) 2016   LAST SEIZURE 1-1.5 YRS AGO  . Sleep apnea    DOES NOT USE CPAP    Patient Active Problem List   Diagnosis Date Noted  . Acid reflux 08/25/2018  . Asthma 08/25/2018  . Intractable chronic migraine without aura and with status migrainosus 11/16/2017  . Anxiety, generalized 09/05/2017  . Right ovarian cyst 01/24/2017  . Localization-related focal epilepsy with complex partial seizures ([place]) 10/05/2016  . Left carpal tunnel syndrome 05/14/2016  . Endometriosis 03/19/2016  . Bilateral hand numbness 03/02/2016  . De Quervain's tenosynovitis, bilateral 01/11/2016  . Family history of cancer 10/12/2015  . Pelvic pain in female 10/12/2015    Past Surgical History:  Procedure Laterality Date  .  HAND TENDON SURGERY Bilateral 11/2015  . HYSTEROSCOPY    . LAPAROSCOPIC APPENDECTOMY  03/31/2017   performed with removal of ovary, at [facility]  . LAPAROSCOPIC CHOLECYSTECTOMY  10/14/2017   performed at [facility]  . LAPAROSCOPIC ENDOMETRIOSIS FULGURATION     03/2017, and 06/2018  . LAPAROSCOPY N/A 03/09/2016   Procedure: LAPAROSCOPY DIAGNOSTIC and D AND C;  Surgeon: Rubie Maid, MD;  Location: [facility] ORS;  Service: Gynecology;  Laterality: N/A;  . TOTAL LAPAROSCOPIC HYSTERECTOMY WITH BILATERAL SALPINGO OOPHORECTOMY     hysterectomy with unilateral ovary removal on 03/31/2017, removal of remaining ovary in 06/15/2018    OB History    Gravida  1   Para  0   Term  0   Preterm  0   AB  1   Living  0     SAB  1   TAB  0   Ectopic  0   Multiple  0   Live Births               Home Medications    Prior to Admission medications   Medication Sig Start Date End Date Taking? Authorizing Provider  acetaminophen (TYLENOL 8 HOUR) 650 MG CR tablet Take 1 tablet (650 mg total) by mouth every 8 (eight) hours as needed for pain (mild). 03/09/16  Yes Rubie Maid, MD  albuterol (PROVENTIL HFA;VENTOLIN HFA) 108 (90 Base) MCG/ACT inhaler  Inhale 2 puffs into the lungs every 6 (six) hours as needed for wheezing or shortness of breath. 04/14/18  Yes Alfred Levins, [place], MD  azelastine (OPTIVAR) 0.05 % ophthalmic solution azelastine 0.05 % eye drops  INSTILL 1 DROP INTO BOTH EYES TWICE A DAY   Yes [provider]  Biotin w/ Vitamins C & E (HAIR/SKIN/NAILS PO) Take by mouth.   Yes [provider]  estradiol (VIVELLE-DOT) 0.1 MG/24HR patch Place 1 patch onto the skin 2 (two) times a week.   Yes [provider]  FLUoxetine (PROZAC) 10 MG capsule Take by mouth. 05/17/19 05/16/20 Yes [provider]  hydrocortisone cream 0.5 % Apply 1 application topically 2 (two) times daily. 11/11/18  Yes Rubie Maid, MD  levETIRAcetam (KEPPRA) 750 MG tablet levetiracetam 750 mg tablet    Yes [provider]  linaclotide (LINZESS) 145 MCG CAPS capsule Take by mouth. 05/10/19  Yes [provider]  Multiple Vitamin (MULTI-VITAMINS) TABS Take by mouth.   Yes [provider]  nortriptyline (PAMELOR) 10 MG capsule Take by mouth. 06/02/16 05/22/19 Yes [provider]  meloxicam (MOBIC) 15 MG tablet Take 1 tablet (15 mg total) by mouth daily as needed for pain. 05/22/19   Coral Spikes, DO  zonisamide (ZONEGRAN) 50 MG capsule  02/11/19   [provider]  clonazePAM (KLONOPIN) 0.5 MG tablet Take by mouth. 01/16/17 05/22/19  [provider]  medroxyPROGESTERone (PROVERA) 10 MG tablet Take 1 tablet (10 mg total) by mouth daily. Take 1 tablet for 7 days each month. 08/25/18 05/22/19  Rubie Maid, MD  promethazine (PHENERGAN) 25 MG tablet Take 1 tablet (25 mg total) by mouth every 4 (four) hours as needed for nausea or vomiting. 01/29/17 04/23/19  Earleen Newport, MD  traZODone (DESYREL) 50 MG tablet 100 mg.   05/22/19  [provider]    Family History Family History  Problem Relation Age of Onset  . Scoliosis Brother   . Fibromyalgia Mother   . Cancer Mother 28       ovarian; s/p partial hysterectomy with RSO  . Graves' disease Mother   . Alzheimer's disease Other   . Cancer Maternal Grandmother 30       breast cancer (unilateral lumpectomy)  . Breast cancer Maternal Grandmother   . Endometriosis Sister     Social History Social History   Tobacco Use  . Smoking status: Former Smoker    Packs/day: 0.50    Years: 1.50    Pack years: 0.75    Types: Cigarettes    Quit date: 03/05/2011    Years since quitting: 8.2  . Smokeless tobacco: Never Used  Substance Use Topics  . Alcohol use: Yes    Comment: occ  . Drug use: No     Allergies   Aspartame, Penicillins, Latex, Prednisone, Lavender oil, and Oxycodone   Review of Systems Review of Systems  Constitutional: Negative.   Musculoskeletal:       Left  ankle and foot pain.   Physical Exam Triage Vital Signs ED Triage Vitals  Enc Vitals Group     BP 05/22/19 1716 112/80     Pulse Rate 05/22/19 1716 98     Resp 05/22/19 1716 18     Temp 05/22/19 1716 97.8 F (36.6 C)     Temp Source 05/22/19 1716 Oral     SpO2 05/22/19 1716 100 %     Weight 05/22/19 1710 157 lb (71.2 kg)     Height 05/22/19  1710 5\' 4"  (1.626 m)     Head Circumference --      Peak Flow --      Pain Score 05/22/19 1710 10     Pain Loc --      Pain Edu? --      Excl. in [place]? --    Updated Vital Signs BP 112/80 (BP Location: Left Arm)   Pulse 98   Temp 97.8 F (36.6 C) (Oral)   Resp 18   Ht 5\' 4"  (1.626 m)   Wt 71.2 kg   LMP 12/26/2016 (Exact Date)   SpO2 100%   BMI 26.95 kg/m   Visual Acuity Right Eye Distance:   Left Eye Distance:   Bilateral Distance:    Right Eye Near:   Left Eye Near:    Bilateral Near:     Physical Exam Vitals and nursing note reviewed.  Constitutional:      General: She is not in acute distress.    Appearance: Normal appearance. She is not ill-appearing.  HENT:     Head: Normocephalic and atraumatic.  Eyes:     General:        Right eye: No discharge.        Left eye: No discharge.     Conjunctiva/sclera: Conjunctivae normal.  Cardiovascular:     Rate and Rhythm: Normal rate and regular rhythm.     Heart sounds: No murmur.  Pulmonary:     Effort: Pulmonary effort is normal.     Breath sounds: Normal breath sounds. No wheezing, rhonchi or rales.  Musculoskeletal:     Comments: Left foot and ankle -patient with tenderness at the base of the fifth metatarsal.  Patient also with exquisite tenderness over the medial lateral malleolus.  No appreciable bruising or swelling.  Neurological:     Mental Status: She is alert.  Psychiatric:        Mood and Affect: Mood normal.        Behavior: Behavior normal.    UC Treatments / Results  Labs (all labs ordered are listed, but only abnormal results are displayed) Labs  Reviewed - No data to display  EKG   Radiology DG Ankle Complete Left  Result Date: 05/22/2019 CLINICAL DATA:  Left foot and ankle pain following a fall 2 days ago. History of plantar fasciitis. EXAM: LEFT ANKLE COMPLETE - 3+ VIEW COMPARISON:  Left foot radiographs obtained at the same time. FINDINGS: There is no evidence of fracture, dislocation, or joint effusion. There is no evidence of arthropathy or other focal bone abnormality. Soft tissues are unremarkable. IMPRESSION: Normal examination. Electronically Signed   By: Claudie Revering M.D.   On: 05/22/2019 17:56   DG Foot Complete Left  Result Date: 05/22/2019 CLINICAL DATA:  Left foot and ankle pain following a fall 2 days ago. EXAM: LEFT FOOT - COMPLETE 3+ VIEW COMPARISON:  None. FINDINGS: There is no evidence of fracture or dislocation. There is no evidence of arthropathy or other focal bone abnormality. Soft tissues are unremarkable. IMPRESSION: Normal examination. Electronically Signed   By: Claudie Revering M.D.   On: 05/22/2019 17:58    Procedures Procedures (including critical care time)  Medications Ordered in UC Medications - No data to display  Initial Impression / Assessment and Plan / UC Course  I have reviewed the triage vital signs and the nursing notes.  Pertinent labs & imaging results that were available during my care of the patient were reviewed by me and considered in  my medical decision making (see chart for details).    28 year old female presents with right ankle sprain.  X-ray negative.  Placed in cam walker for comfort.  Meloxicam as directed.  Final Clinical Impressions(s) / UC Diagnoses   Final diagnoses:  Sprain of left ankle, unspecified ligament, initial encounter     Discharge Instructions     Rest, ice, elevation.  Medication as needed.  Take care  Dr. Lacinda Axon    ED Prescriptions    Medication Sig Dispense Auth. Provider   meloxicam (MOBIC) 15 MG tablet Take 1 tablet (15 mg total) by mouth  daily as needed for pain. 30 tablet Coral Spikes, DO     PDMP not reviewed this encounter.   Coral Spikes, [place] 05/22/19 [phone]

## 2019-05-22 NOTE — Discharge Instructions (Signed)
Rest, ice, elevation.  Medication as needed.  Take care  Dr. Lacinda Axon

## 2019-05-22 NOTE — ED Triage Notes (Signed)
Pt c/o left ankle pain. She states that she fell on icy service at her home about 2 days ago. She is wearing a boot today.

## 2019-06-07 ENCOUNTER — Encounter: Payer: Medicare Other | Admitting: Obstetrics and Gynecology

## 2019-07-13 ENCOUNTER — Emergency Department
Admission: EM | Admit: 2019-07-13 | Discharge: 2019-07-13 | Disposition: A | Discharge status: Home / Self Care | Payer: Medicare Other | Attending: Emergency Medicine | Admitting: Emergency Medicine

## 2019-07-13 ENCOUNTER — Other Ambulatory Visit: Payer: Self-pay

## 2019-07-13 ENCOUNTER — Encounter: Payer: Self-pay | Admitting: Emergency Medicine

## 2019-07-13 DIAGNOSIS — Z5321 Procedure and treatment not carried out due to patient leaving prior to being seen by health care provider: Secondary | ICD-10-CM | POA: Insufficient documentation

## 2019-07-13 DIAGNOSIS — R55 Syncope and collapse: Secondary | ICD-10-CM | POA: Diagnosis not present

## 2019-07-13 LAB — URINALYSIS, COMPLETE (UACMP) WITH MICROSCOPIC
Bacteria, UA: NONE SEEN
Bilirubin Urine: NEGATIVE
Glucose, UA: NEGATIVE mg/dL
Hgb urine dipstick: NEGATIVE
Ketones, ur: NEGATIVE mg/dL
Leukocytes,Ua: NEGATIVE
Nitrite: NEGATIVE
Protein, ur: NEGATIVE mg/dL
Specific Gravity, Urine: 1.026 (ref 1.005–1.030)
pH: 6 (ref 5.0–8.0)

## 2019-07-13 LAB — CBC
HCT: 43.4 % (ref 36.0–46.0)
Hemoglobin: 14.2 g/dL (ref 12.0–15.0)
MCH: 28.4 pg (ref 26.0–34.0)
MCHC: 32.7 g/dL (ref 30.0–36.0)
MCV: 86.8 fL (ref 80.0–100.0)
Platelets: 182 10*3/uL (ref 150–400)
RBC: 5 MIL/uL (ref 3.87–5.11)
RDW: 12.5 % (ref 11.5–15.5)
WBC: 8.8 10*3/uL (ref 4.0–10.5)
nRBC: 0 % (ref 0.0–0.2)

## 2019-07-13 LAB — BASIC METABOLIC PANEL
Anion gap: 10 (ref 5–15)
BUN: 30 mg/dL — ABNORMAL HIGH (ref 6–20)
CO2: 24 mmol/L (ref 22–32)
Calcium: 9.6 mg/dL (ref 8.9–10.3)
Chloride: 105 mmol/L (ref 98–111)
Creatinine, Ser: 0.46 mg/dL (ref 0.44–1.00)
GFR calc Af Amer: >60 mL/min (ref >60)
GFR calc non Af Amer: >60 mL/min (ref >60)
Glucose, Bld: 100 mg/dL — ABNORMAL HIGH (ref 70–99)
Potassium: 4.1 mmol/L (ref 3.5–5.1)
Sodium: 139 mmol/L (ref 135–145)

## 2019-07-13 NOTE — ED Triage Notes (Signed)
Patient presents to the ED via EMS from home.  Patient reports having 10 syncopal episodes since Monday.  Patient states the first one was while she was having a tele-visit with her neurologist and neurologist sent an RN to patient's home to check on her because she was unconscious for longer than 1 minute.  Patient states she has had 2 syncopal episodes today.  Patient states she cannot stand for more than 2 minutes.

## 2019-09-12 ENCOUNTER — Encounter: Payer: Medicare Other | Admitting: Obstetrics and Gynecology

## 2021-06-15 ENCOUNTER — Emergency Department
Admission: EM | Admit: 2021-06-15 | Discharge: 2021-06-15 | Disposition: A | Discharge status: Home / Self Care | Payer: Medicare Other | Attending: Emergency Medicine | Admitting: Emergency Medicine

## 2021-06-15 DIAGNOSIS — Z5321 Procedure and treatment not carried out due to patient leaving prior to being seen by health care provider: Secondary | ICD-10-CM | POA: Diagnosis not present

## 2021-06-15 DIAGNOSIS — N939 Abnormal uterine and vaginal bleeding, unspecified: Secondary | ICD-10-CM | POA: Insufficient documentation

## 2021-06-15 NOTE — ED Triage Notes (Signed)
Pt stated she was leaving if not going to a room. Explained the process of checking in and beginning care, pt states she will wait on OB

## 2021-06-19 NOTE — H&P (Signed)
Grace Norris is a 31 y.o. female here for L/S possible excision of endometriosis  pt with chronic pelvic pain  She has a long history of pelvic pain starting with menarche  Pain is central pelvic. She  was under the care of Dr Marcelline Mates who performed L/s in 2017 with bx proven endometriosis of the left uterosacral area. She was tried on continuous OCP, vaginal ring , depo Lupron all of which either she didn't tolerate or didn't help . She then underwent a TLH, excision of endometriosis( uterosacral and in posterior cul-de-sac) and appendectomy 03/2017  at [facility] which again didn't   Ultimately help her pain . 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    ALL records from surgery reviewed as well as pathology report   Pt continues to have central pelvic pain and in unable to engage in sexual intercourse due to pain . Sex drive has diminished and orgasm ability decrease . She has been to pelvic floor PT 1 yr ago which has not helped . 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    Past Medical History:  has a past medical history of Allergy, Anemia, Anxiety (self), Asthma without status asthmaticus, unspecified, Coronary artery disease, Endometriosis, GERD (gastroesophageal reflux disease) (mother), History of EMG/NCV [facility] 03/25/2016 (03/25/2016), Hormone deficiency (03/31/2016), Intractable chronic migraine without aura and without status migrainosus (10/05/2016), Kidney stone, Legally blind, Migraine, Miscarriage, Neuro-degenerative disorders (CMS-HCC), PONV (postoperative nausea and vomiting), Right carpal tunnel syndrome (03/31/2016), Scoliosis, Seizures (CMS-HCC), and Syncope and collapse.  Past Surgical History:  has a past surgical history  that includes incision extensor tendon sheath wrist for radial styloid (Right, 11/06/2015); incision extensor tendon sheath wrist for radial styloid (Left, 12/04/2015); neuroplasty &/or transposition median nerve at carpal tunnel (Right, 05/01/2016); Endoscopic Carpal Tunnel Release (Right); neuroplasty &/or transposition median nerve at carpal tunnel (Left, 06/03/2016); Laparoscopic cholecystectomy (10/14/2017); Cholecystectomy; Breast surgery; incision extensor tendon sheath wrist for radial styloid (Left, 01/14/2018); Bilateral Oopherectomy (Bilateral, 06/2018); injection trigger point muscle (N/A, 12/27/2020); Hysterectomy (03/2017); and Appendectomy (03/2017). Family History: family history includes Alcohol abuse in her father; Alzheimer's disease in her maternal grandmother and another family member; Brain cancer in her father; COPD in her maternal grandmother; Dementia in her maternal grandmother; Fibromyalgia in her mother; High blood pressure (Hypertension) in her mother; Migraines in her brother and father; Scoliosis in her brother; Stroke in her maternal grandmother; Substance Abuse in her father; Thyroid disease in her mother. Social History:  reports that she quit smoking about 9 years ago. Her smoking use included cigarettes. She started smoking about 11 years ago. She has a 2.00 pack-year smoking history. She has never used smokeless tobacco. She reports that she does not currently use alcohol. She reports that she does not use drugs. OB/GYN History:          OB History     Gravida  1   Para      Term      Preterm      AB  1   Living         SAB      IAB      Ectopic      Molar  Multiple      Live Births                Allergies: is allergic to aspartame, meloxicam, penicillins, clarithromycin, latex, prednisone, diphenhydramine, eggshell membrane, gabapentin, gluten protein, lactose, lavender, propranolol, topamax [topiramate], and ibuprofen. Medications:    Current Outpatient Medications:    acetaminophen (TYLENOL) 500 mg capsule, Take 1 capsule (500 mg total) by mouth as needed, Disp: , Rfl:    albuterol 90 mcg/actuation inhaler, Inhale 2 inhalations into the lungs every 6 (six) hours as needed for Wheezing, Disp: 6.7 g, Rfl: 0   dicyclomine (BENTYL) 20 mg tablet, Take 1 tablet (20 mg total) by mouth 4 (four) times daily as needed, Disp: , Rfl:    eptinezumab-jjmr (VYEPTI IV), Inject into the vein every 3 (three) months, Disp: , Rfl:    hydrocortisone (ANUSOL-HC) 25 mg suppository, Place 1 suppository (25 mg total) rectally 2 (two) times daily, Disp: , Rfl:    lamoTRIgine (LAMICTAL) 150 MG tablet, Take 1 tablet (150 mg total) by mouth 2 (two) times daily, Disp: 60 tablet, Rfl: 5   LINZESS 290 mcg capsule, Take 1 capsule (290 mcg total) by mouth once daily, Disp: , Rfl:    LORazepam (ATIVAN) 0.5 MG tablet, Take 1 tablet (0.5 mg total) by mouth 2 (two) times daily as needed for Anxiety for up to 10 days, Disp: 20 tablet, Rfl: 0   omeprazole (PRILOSEC) 20 MG DR capsule, Take 1 capsule (20 mg total) by mouth once daily, Disp: , Rfl:    prazosin (MINIPRESS) 2 MG capsule, Take 1 capsule (2 mg total) by mouth at bedtime, Disp: 30 capsule, Rfl: 1   prazosin (MINIPRESS) 2 MG capsule, Take 1 capsule (2 mg total) by mouth at bedtime, Disp: 90 capsule, Rfl: 1   promethazine (PHENERGAN) 25 MG tablet, , Disp: , Rfl:    QUEtiapine (SEROQUEL) 100 MG tablet, Take 1-2 at HS for severe migraine, Disp: 180 tablet, Rfl: 0   rimegepant (NURTEC ODT) 75 mg disintegrating tablet, Take 1 tablet (75 mg total) by mouth as directed, Disp: 8 tablet, Rfl: 5   VENTOLIN HFA 90 mcg/actuation inhaler, INHALE 2 PUFFS BY MOUTH EVERY 6 HOURS AS NEEDED FOR WHEEZING, Disp: 18 each, Rfl: 1   vortioxetine (TRINTELLIX) 5 mg tablet, Take 1 tablet (5 mg total) by mouth once daily, Disp: 30 tablet, Rfl: 1   baclofen (LIORESAL) 20 MG tablet, Take 1 tablet (20 mg total) by mouth 3 (three) times  daily as needed Pelvic floor muscle spasm (Patient not taking: Reported on 05/29/2021), Disp: , Rfl:    brompheniramine-pseudoephed-DM (BROMFED DM) 2-30-10 mg/5 mL syrup, Take 10 mLs by mouth every 6 (six) hours as needed (Patient not taking: Reported on 05/29/2021), Disp: 118 mL, Rfl: 0   estradioL (CLIMARA) 0.05 mg/24 hr patch, Place 1 patch onto the skin once a week, Disp: 12 patch, Rfl: 3   fluticasone propionate (FLONASE) 50 mcg/actuation nasal spray, fluticasone propionate 50 mcg/actuation nasal spray,suspension  SPRAY 2 SPRAYS INTO EACH NOSTRIL EVERY DAY, Disp: , Rfl:    ketorolac (TORADOL) 10 mg tablet, Take 1 tablet (10 mg total) by mouth every 8 (eight) hours as needed for Pain (LIMIT USE TO 3 DAYS PER WEEK) (Patient not taking: Reported on 06/13/2021), Disp: 20 tablet, Rfl: 1   metoclopramide (REGLAN) 10 MG tablet, Take 1 tablet (10 mg total) by mouth 4 (four) times daily as needed (nausea) (Patient not taking: Reported on 05/29/2021), Disp: 20 tablet, Rfl: 0  REYVOW 100 mg Tab, Take 100 mg by mouth once daily as needed (Patient not taking: Reported on 03/31/2021), Disp: 8 tablet, Rfl: 5   Review of Systems: General:                      No fatigue or weight loss,+decrease libido Eyes:                           No vision changes Ears:                            No hearing difficulty Respiratory:                No cough or shortness of breath Pulmonary:                  No asthma or shortness of breath Cardiovascular:           No chest pain, palpitations, dyspnea on exertion Gastrointestinal:          No abdominal bloating, chronic diarrhea, constipations, masses, pain or hematochezia Genitourinary:             No hematuria, dysuria, abnormal vaginal discharge,+ pelvic pain, Menometrorrhagia, + dyspareunia  Lymphatic:                   No swollen lymph nodes Musculoskeletal:         No muscle weakness Neurologic:                  No extremity weakness, syncope, seizure  disorder Psychiatric:                  No history of depression, delusions or suicidal/homicidal ideation      Exam:       Vitals:    06/13/21 1556  BP: 121/85  Pulse: 92      Body mass index is 27.46 kg/m.   WDWN white/ female in NAD   Lungs: CTA  CV : RRR without murmur   Breast: exam done in sitting and lying position : No dimpling or retraction, no dominant mass, no spontaneous discharge, no axillary adenopathy Right breast 10 oclock  5 cm from nipple . Mild TTP no definitive mass / cyst . Neck:  no thyromegaly Abdomen: soft , no mass, normal active bowel sounds,  non-tender, no rebound tenderness Pelvic: tanner stage 5 ,  External genitalia: vulva /labia no lesions Urethra: no prolapse Vagina: normal physiologic d/c, cuff TTP  Cervix: absent   Uterus: absent Adnexa: mild bilateral TTP    Rectovaginal:    Impression:    The primary encounter diagnosis was Chronic pelvic pain in female. 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   Chronic pelvic pain may be resulting persistent endometriosis despite BSO and decreased estrogen levels , Possible pelvic adhesions. Possible myofascial pain of the pelvic floor .      Plan:    I told her clearly I may not be able to help her with her pain , but after a thorough discussion I offered a repeat l/s to look for endometriosi and excise this . Possible LOA as well. If little is accomplished after surgery another eval by pelvic floor PT . Marland Kitchen She is willing to undergo surgery again    risks of the procedure explained .  See [place] notes       Caroline Sauger, MD            Electronically signed by Harrie Cazarez, Burman Blacksmith, MD

## 2021-06-25 ENCOUNTER — Other Ambulatory Visit
Admission: RE | Admit: 2021-06-25 | Discharge: 2021-06-25 | Disposition: A | Discharge status: Home / Self Care | Payer: Medicare Other | Source: Ambulatory Visit | Attending: Obstetrics and Gynecology | Admitting: Obstetrics and Gynecology

## 2021-06-25 ENCOUNTER — Other Ambulatory Visit: Payer: Self-pay

## 2021-06-25 HISTORY — DX: Other specified abnormal findings of blood chemistry: R79.89

## 2021-06-25 HISTORY — DX: Other complications of anesthesia, initial encounter: T88.59XA

## 2021-06-25 HISTORY — DX: Unspecified osteoarthritis, unspecified site: M19.90

## 2021-06-25 HISTORY — DX: Personal history of urinary calculi: Z87.442

## 2021-06-25 HISTORY — DX: Depression, unspecified: F32.A

## 2021-06-25 HISTORY — DX: Anxiety disorder, unspecified: F41.9

## 2021-06-25 HISTORY — DX: Irritable bowel syndrome, unspecified: K58.9

## 2021-06-25 HISTORY — DX: Other specified postprocedural states: Z98.890

## 2021-06-25 HISTORY — DX: Gastro-esophageal reflux disease without esophagitis: K21.9

## 2021-06-25 NOTE — Patient Instructions (Addendum)
Your procedure is scheduled on: 06/30/21 - Monday Report to the Registration Desk on the 1st floor of the [facility]. To find out your arrival time, please call [phone] between [place] on: 06/27/21  Report to [facility] for labs on 06/26/21 at 12:00.  REMEMBER: Instructions that are not followed completely may result in serious medical risk, up to and including death; or upon the discretion of your surgeon and anesthesiologist your surgery may need to be rescheduled.  Do not eat food after midnight the night before surgery.  No gum chewing, lozengers or hard candies.  You may however, drink CLEAR liquids up to 2 hours before you are scheduled to arrive for your surgery. Do not drink anything within 2 hours of your scheduled arrival time.  Clear liquids include: - water  - apple juice without pulp - gatorade (not RED, PURPLE, OR BLUE) - black coffee or tea (Do NOT add milk or creamers to the coffee or tea) Do NOT drink anything that is not on this list.  In addition, your doctor has ordered for you to drink the provided  Ensure Pre-Surgery Clear Carbohydrate Drink  Drinking this carbohydrate drink up to two hours before surgery helps to reduce insulin resistance and improve patient outcomes. Please complete drinking 2 hours prior to scheduled arrival time.  TAKE THESE MEDICATIONS THE MORNING OF SURGERY WITH A SIP OF WATER:  - lamoTRIgine (LAMICTAL) - linaclotide (LINZESS)  - omeprazole (PRILOSEC) 20 MG capsule, (take one the night before and one on the morning of surgery - helps to prevent nausea after surgery.) - LORazepam (ATIVAN)  if needed.  Use albuterol (PROVENTIL)  on the day of surgery and bring to the hospital.  One week prior to surgery: Stop Anti-inflammatories (NSAIDS) such as Advil, Aleve, Ibuprofen, Motrin, Naproxen, Naprosyn and Aspirin based products such as Excedrin, Goodys Powder, BC Powder.  Stop ANY OVER THE COUNTER supplements until after  surgery.  You may  take Tylenol if needed for pain up until the day of surgery.  No Alcohol for 24 hours before or after surgery.  No Smoking including e-cigarettes for 24 hours prior to surgery.  No chewable tobacco products for at least 6 hours prior to surgery.  No nicotine patches on the day of surgery.  Do not use any "recreational" drugs for at least a week prior to your surgery.  Please be advised that the combination of cocaine and anesthesia may have negative outcomes, up to and including death. If you test positive for cocaine, your surgery will be cancelled.  On the morning of surgery brush your teeth with toothpaste and water, you may rinse your mouth with mouthwash if you wish. Do not swallow any toothpaste or mouthwash.  Use CHG Soap or wipes as directed on instruction sheet.  Do not wear jewelry, make-up, hairpins, clips or nail polish.  Do not wear lotions, powders, or perfumes.   Do not shave body from the neck down 48 hours prior to surgery just in case you cut yourself which could leave a site for infection.  Also, freshly shaved skin may become irritated if using the CHG soap.  Contact lenses, hearing aids and dentures may not be worn into surgery.  Do not bring valuables to the hospital. [facility] is not responsible for any missing/lost belongings or valuables.   Notify your doctor if there is any change in your medical condition (cold, fever, infection).  Wear comfortable clothing (specific to your surgery type)  to the hospital.  After surgery, you can help prevent lung complications by doing breathing exercises.  Take deep breaths and cough every 1-2 hours. Your doctor may order a device called an Incentive Spirometer to help you take deep breaths. When coughing or sneezing, hold a pillow firmly against your incision with both hands. This is called splinting. Doing this helps protect your incision. It also decreases belly discomfort.  If you are being  admitted to the hospital overnight, leave your suitcase in the car. After surgery it may be brought to your room.  If you are being discharged the day of surgery, you will not be allowed to drive home. You will need a responsible adult (18 years or older) to drive you home and stay with you that night.   If you are taking public transportation, you will need to have a responsible adult (18 years or older) with you. Please confirm with your physician that it is acceptable to use public transportation.   Please call the [facility] at [phone] if you have any questions about these instructions.  Surgery Visitation Policy:  Patients undergoing a surgery or procedure may have one family member or support person with them as long as that person is not COVID-19 positive or experiencing its symptoms.  That person may remain in the waiting area during the procedure and may rotate out with other people.  Inpatient Visitation:    Visiting hours are 7 a.m. to 8 p.m. Up to two visitors ages 16+ are allowed at one time in a patient room. The visitors may rotate out with other people during the day. Visitors must check out when they leave, or other visitors will not be allowed. One designated support person may remain overnight. The visitor must pass COVID-19 screenings, use hand sanitizer when entering and exiting the patients room and wear a mask at all times, including in the patients room. Patients must also wear a mask when staff or their visitor are in the room. Masking is required regardless of vaccination status.

## 2021-06-26 ENCOUNTER — Encounter: Payer: Self-pay | Admitting: Urgent Care

## 2021-06-26 ENCOUNTER — Other Ambulatory Visit
Admission: RE | Admit: 2021-06-26 | Discharge: 2021-06-26 | Disposition: A | Discharge status: Home / Self Care | Payer: Medicare Other | Source: Ambulatory Visit | Attending: Obstetrics and Gynecology | Admitting: Obstetrics and Gynecology

## 2021-06-26 DIAGNOSIS — Z01812 Encounter for preprocedural laboratory examination: Secondary | ICD-10-CM | POA: Insufficient documentation

## 2021-06-26 DIAGNOSIS — Z01818 Encounter for other preprocedural examination: Secondary | ICD-10-CM

## 2021-06-26 LAB — BASIC METABOLIC PANEL
Anion gap: 9 (ref 5–15)
BUN: 19 mg/dL (ref 6–20)
CO2: 27 mmol/L (ref 22–32)
Calcium: 9.2 mg/dL (ref 8.9–10.3)
Chloride: 105 mmol/L (ref 98–111)
Creatinine, Ser: 0.75 mg/dL (ref 0.44–1.00)
GFR, Estimated: >60 mL/min (ref >60)
Glucose, Bld: 122 mg/dL — ABNORMAL HIGH (ref 70–99)
Potassium: 3.7 mmol/L (ref 3.5–5.1)
Sodium: 141 mmol/L (ref 135–145)

## 2021-06-26 LAB — CBC
HCT: 42.8 % (ref 36.0–46.0)
Hemoglobin: 14.2 g/dL (ref 12.0–15.0)
MCH: 28.1 pg (ref 26.0–34.0)
MCHC: 33.2 g/dL (ref 30.0–36.0)
MCV: 84.6 fL (ref 80.0–100.0)
Platelets: 283 10*3/uL (ref 150–400)
RBC: 5.06 MIL/uL (ref 3.87–5.11)
RDW: 12.8 % (ref 11.5–15.5)
WBC: 7.5 10*3/uL (ref 4.0–10.5)
nRBC: 0 % (ref 0.0–0.2)

## 2021-06-26 LAB — TYPE AND SCREEN
ABO/RH(D): A POS
Antibody Screen: NEGATIVE

## 2021-06-30 ENCOUNTER — Ambulatory Visit: Payer: Medicare Other | Admitting: Certified Registered"

## 2021-06-30 ENCOUNTER — Encounter
Admission: RE | Disposition: A | Discharge status: Home / Self Care | Payer: Self-pay | Source: Home / Self Care | Attending: Obstetrics and Gynecology

## 2021-06-30 ENCOUNTER — Ambulatory Visit
Admission: RE | Admit: 2021-06-30 | Discharge: 2021-06-30 | Disposition: A | Discharge status: Home / Self Care | Payer: Medicare Other | Attending: Obstetrics and Gynecology | Admitting: Obstetrics and Gynecology

## 2021-06-30 ENCOUNTER — Other Ambulatory Visit: Payer: Self-pay

## 2021-06-30 ENCOUNTER — Encounter: Payer: Self-pay | Admitting: Obstetrics and Gynecology

## 2021-06-30 DIAGNOSIS — N809 Endometriosis, unspecified: Secondary | ICD-10-CM | POA: Insufficient documentation

## 2021-06-30 DIAGNOSIS — M3119 Other thrombotic microangiopathy: Secondary | ICD-10-CM | POA: Insufficient documentation

## 2021-06-30 DIAGNOSIS — J45909 Unspecified asthma, uncomplicated: Secondary | ICD-10-CM | POA: Insufficient documentation

## 2021-06-30 DIAGNOSIS — I10 Essential (primary) hypertension: Secondary | ICD-10-CM | POA: Diagnosis not present

## 2021-06-30 DIAGNOSIS — Z90722 Acquired absence of ovaries, bilateral: Secondary | ICD-10-CM | POA: Diagnosis not present

## 2021-06-30 DIAGNOSIS — N941 Unspecified dyspareunia: Secondary | ICD-10-CM | POA: Diagnosis not present

## 2021-06-30 DIAGNOSIS — Z01818 Encounter for other preprocedural examination: Secondary | ICD-10-CM

## 2021-06-30 DIAGNOSIS — Z87891 Personal history of nicotine dependence: Secondary | ICD-10-CM | POA: Insufficient documentation

## 2021-06-30 DIAGNOSIS — N644 Mastodynia: Secondary | ICD-10-CM | POA: Insufficient documentation

## 2021-06-30 DIAGNOSIS — Q625 Duplication of ureter: Secondary | ICD-10-CM | POA: Insufficient documentation

## 2021-06-30 DIAGNOSIS — M1990 Unspecified osteoarthritis, unspecified site: Secondary | ICD-10-CM | POA: Diagnosis not present

## 2021-06-30 DIAGNOSIS — D649 Anemia, unspecified: Secondary | ICD-10-CM | POA: Diagnosis not present

## 2021-06-30 DIAGNOSIS — Z9089 Acquired absence of other organs: Secondary | ICD-10-CM | POA: Insufficient documentation

## 2021-06-30 DIAGNOSIS — F5222 Female sexual arousal disorder: Secondary | ICD-10-CM | POA: Diagnosis not present

## 2021-06-30 DIAGNOSIS — F419 Anxiety disorder, unspecified: Secondary | ICD-10-CM | POA: Insufficient documentation

## 2021-06-30 DIAGNOSIS — G8929 Other chronic pain: Secondary | ICD-10-CM | POA: Insufficient documentation

## 2021-06-30 DIAGNOSIS — F32A Depression, unspecified: Secondary | ICD-10-CM | POA: Diagnosis not present

## 2021-06-30 DIAGNOSIS — D759 Disease of blood and blood-forming organs, unspecified: Secondary | ICD-10-CM | POA: Insufficient documentation

## 2021-06-30 DIAGNOSIS — N736 Female pelvic peritoneal adhesions (postinfective): Secondary | ICD-10-CM | POA: Insufficient documentation

## 2021-06-30 DIAGNOSIS — I499 Cardiac arrhythmia, unspecified: Secondary | ICD-10-CM | POA: Insufficient documentation

## 2021-06-30 DIAGNOSIS — Z90710 Acquired absence of both cervix and uterus: Secondary | ICD-10-CM | POA: Diagnosis not present

## 2021-06-30 HISTORY — PX: LAPAROSCOPY: SHX197

## 2021-06-30 SURGERY — LAPAROSCOPY, DIAGNOSTIC
Anesthesia: General | Site: Abdomen

## 2021-06-30 MED ORDER — FENTANYL CITRATE (PF) 100 MCG/2ML IJ SOLN
INTRAMUSCULAR | Status: AC
  Administered 2021-06-30: 25 ug via INTRAVENOUS
  Filled 2021-06-30: qty 2

## 2021-06-30 MED ORDER — SUGAMMADEX SODIUM 200 MG/2ML IV SOLN
INTRAVENOUS | Status: DC | PRN
  Administered 2021-06-30: 200 mg via INTRAVENOUS

## 2021-06-30 MED ORDER — APREPITANT 40 MG PO CAPS
40.0000 mg | ORAL_CAPSULE | Freq: Once | ORAL | Status: AC
Start: 2021-06-30 — End: 2021-06-30

## 2021-06-30 MED ORDER — FENTANYL CITRATE (PF) 100 MCG/2ML IJ SOLN
INTRAMUSCULAR | Status: DC | PRN
  Administered 2021-06-30 (×2): 50 ug via INTRAVENOUS

## 2021-06-30 MED ORDER — MIDAZOLAM HCL 2 MG/2ML IJ SOLN
INTRAMUSCULAR | Status: DC | PRN
  Administered 2021-06-30: 2 mg via INTRAVENOUS

## 2021-06-30 MED ORDER — DEXAMETHASONE SODIUM PHOSPHATE 10 MG/ML IJ SOLN
INTRAMUSCULAR | Status: DC | PRN
  Administered 2021-06-30: 10 mg via INTRAVENOUS

## 2021-06-30 MED ORDER — LIDOCAINE HCL (CARDIAC) PF 100 MG/5ML IV SOSY
PREFILLED_SYRINGE | INTRAVENOUS | Status: DC | PRN
  Administered 2021-06-30: 100 mg via INTRAVENOUS

## 2021-06-30 MED ORDER — KETOROLAC TROMETHAMINE 30 MG/ML IJ SOLN
INTRAMUSCULAR | Status: AC
  Filled 2021-06-30: qty 1

## 2021-06-30 MED ORDER — APREPITANT 40 MG PO CAPS
ORAL_CAPSULE | ORAL | Status: AC
  Administered 2021-06-30: 40 mg via ORAL
  Filled 2021-06-30: qty 1

## 2021-06-30 MED ORDER — LACTATED RINGERS IV SOLN: INTRAVENOUS | Status: DC

## 2021-06-30 MED ORDER — DEXAMETHASONE SODIUM PHOSPHATE 10 MG/ML IJ SOLN
INTRAMUSCULAR | Status: AC
  Filled 2021-06-30: qty 1

## 2021-06-30 MED ORDER — LIDOCAINE HCL (PF) 2 % IJ SOLN
INTRAMUSCULAR | Status: AC
  Filled 2021-06-30: qty 5

## 2021-06-30 MED ORDER — ACETAMINOPHEN 500 MG PO TABS: 1000.0000 mg | ORAL_TABLET | ORAL | Status: AC

## 2021-06-30 MED ORDER — ORAL CARE MOUTH RINSE: 15.0000 mL | Freq: Once | OROMUCOSAL | Status: AC

## 2021-06-30 MED ORDER — GABAPENTIN 300 MG PO CAPS
ORAL_CAPSULE | ORAL | Status: AC
  Filled 2021-06-30: qty 1

## 2021-06-30 MED ORDER — GABAPENTIN 300 MG PO CAPS: 300.0000 mg | ORAL_CAPSULE | ORAL | Status: DC

## 2021-06-30 MED ORDER — ONDANSETRON HCL 4 MG/2ML IJ SOLN: 4.0000 mg | Freq: Once | INTRAMUSCULAR | Status: DC | PRN

## 2021-06-30 MED ORDER — ONDANSETRON HCL 4 MG/2ML IJ SOLN
INTRAMUSCULAR | Status: AC
  Filled 2021-06-30: qty 2

## 2021-06-30 MED ORDER — SODIUM CHLORIDE 0.9 % IV SOLN: INTRAVENOUS | Status: DC | PRN

## 2021-06-30 MED ORDER — PROPOFOL 10 MG/ML IV BOLUS
INTRAVENOUS | Status: DC | PRN
  Administered 2021-06-30: 200 mg via INTRAVENOUS

## 2021-06-30 MED ORDER — FENTANYL CITRATE (PF) 100 MCG/2ML IJ SOLN
25.0000 ug | INTRAMUSCULAR | Status: AC | PRN
  Administered 2021-06-30 (×6): 25 ug via INTRAVENOUS

## 2021-06-30 MED ORDER — ROCURONIUM BROMIDE 100 MG/10ML IV SOLN
INTRAVENOUS | Status: DC | PRN
  Administered 2021-06-30: 50 mg via INTRAVENOUS

## 2021-06-30 MED ORDER — SILVER NITRATE-POT NITRATE 75-25 % EX MISC
CUTANEOUS | Status: AC
  Filled 2021-06-30: qty 10

## 2021-06-30 MED ORDER — ROCURONIUM BROMIDE 10 MG/ML (PF) SYRINGE
PREFILLED_SYRINGE | INTRAVENOUS | Status: AC
  Filled 2021-06-30: qty 10

## 2021-06-30 MED ORDER — KETOROLAC TROMETHAMINE 30 MG/ML IJ SOLN
INTRAMUSCULAR | Status: DC | PRN
Start: 2021-06-30 — End: 2021-06-30
  Administered 2021-06-30: 30 mg via INTRAVENOUS

## 2021-06-30 MED ORDER — CHLORHEXIDINE GLUCONATE 0.12 % MT SOLN
OROMUCOSAL | Status: AC
  Administered 2021-06-30: 15 mL via OROMUCOSAL
  Filled 2021-06-30: qty 15

## 2021-06-30 MED ORDER — PROPOFOL 10 MG/ML IV BOLUS
INTRAVENOUS | Status: AC
  Filled 2021-06-30: qty 20

## 2021-06-30 MED ORDER — BUPIVACAINE HCL 0.5 % IJ SOLN
INTRAMUSCULAR | Status: DC | PRN
  Administered 2021-06-30: 11 mL

## 2021-06-30 MED ORDER — CHLORHEXIDINE GLUCONATE 0.12 % MT SOLN: 15.0000 mL | Freq: Once | OROMUCOSAL | Status: AC

## 2021-06-30 MED ORDER — ONDANSETRON HCL 4 MG/2ML IJ SOLN
INTRAMUSCULAR | Status: DC | PRN
  Administered 2021-06-30: 4 mg via INTRAVENOUS

## 2021-06-30 MED ORDER — FENTANYL CITRATE (PF) 100 MCG/2ML IJ SOLN
INTRAMUSCULAR | Status: AC
  Filled 2021-06-30: qty 2

## 2021-06-30 MED ORDER — MIDAZOLAM HCL 2 MG/2ML IJ SOLN
INTRAMUSCULAR | Status: AC
  Filled 2021-06-30: qty 2

## 2021-06-30 MED ORDER — BUPIVACAINE HCL (PF) 0.5 % IJ SOLN
INTRAMUSCULAR | Status: AC
  Filled 2021-06-30: qty 30

## 2021-06-30 MED ORDER — ACETAMINOPHEN 500 MG PO TABS
ORAL_TABLET | ORAL | Status: AC
  Administered 2021-06-30: 1000 mg via ORAL
  Filled 2021-06-30: qty 2

## 2021-06-30 MED ORDER — POVIDONE-IODINE 10 % EX SWAB: 2.0000 "application " | Freq: Once | CUTANEOUS | Status: DC

## 2021-06-30 SURGICAL SUPPLY — 50 items
APL PRP STRL LF DISP 70% ISPRP (MISCELLANEOUS) ×1
APL SRG 38 LTWT LNG FL B (MISCELLANEOUS)
APPLICATOR ARISTA FLEXITIP XL (MISCELLANEOUS) IMPLANT
BLADE SURG SZ11 CARB STEEL (BLADE) ×2 IMPLANT
CATH FOLEY 2WAY  5CC 16FR (CATHETERS) ×1
CATH FOLEY 2WAY 5CC 16FR (CATHETERS) ×1
CATH ROBINSON RED A/P 16FR (CATHETERS) ×2 IMPLANT
CATH URTH 16FR FL 2W BLN LF (CATHETERS) ×1 IMPLANT
CHLORAPREP W/TINT 26 (MISCELLANEOUS) ×2 IMPLANT
CUP MEDICINE 2OZ PLAST GRAD ST (MISCELLANEOUS) ×1 IMPLANT
DEFOGGER SCOPE WARMER CLEARIFY (MISCELLANEOUS) ×2 IMPLANT
DRSG TEGADERM 2-3/8X2-3/4 SM (GAUZE/BANDAGES/DRESSINGS) ×6 IMPLANT
GAUZE 4X4 16PLY ~~LOC~~+RFID DBL (SPONGE) ×2 IMPLANT
GLOVE SURG SYN 8.0 (GLOVE) ×2 IMPLANT
GLOVE SURG SYN 8.0 PF PI (GLOVE) ×1 IMPLANT
GOWN STRL REUS W/ TWL LRG LVL3 (GOWN DISPOSABLE) ×1 IMPLANT
GOWN STRL REUS W/ TWL XL LVL3 (GOWN DISPOSABLE) ×1 IMPLANT
GOWN STRL REUS W/TWL LRG LVL3 (GOWN DISPOSABLE) ×2
GOWN STRL REUS W/TWL XL LVL3 (GOWN DISPOSABLE) ×2
GRASPER SUT TROCAR 14GX15 (MISCELLANEOUS) ×4 IMPLANT
HEMOSTAT ARISTA ABSORB 1G (HEMOSTASIS) IMPLANT
HEMOSTAT ARISTA ABSORB 3G PWDR (HEMOSTASIS) IMPLANT
IRRIGATION STRYKERFLOW (MISCELLANEOUS) ×1 IMPLANT
IRRIGATOR STRYKERFLOW (MISCELLANEOUS) ×2
IV NS 1000ML (IV SOLUTION) ×2
IV NS 1000ML BAXH (IV SOLUTION) ×1 IMPLANT
KIT PINK PAD W/HEAD ARE REST (MISCELLANEOUS) ×2
KIT PINK PAD W/HEAD ARM REST (MISCELLANEOUS) ×1 IMPLANT
KIT TURNOVER CYSTO (KITS) ×2 IMPLANT
LABEL OR SOLS (LABEL) ×2 IMPLANT
MANIFOLD NEPTUNE II (INSTRUMENTS) ×2 IMPLANT
NS IRRIG 500ML POUR BTL (IV SOLUTION) ×2 IMPLANT
PACK GYN LAPAROSCOPIC (MISCELLANEOUS) ×2 IMPLANT
PAD OB MATERNITY 4.3X12.25 (PERSONAL CARE ITEMS) ×2 IMPLANT
PAD PREP 24X41 OB/GYN DISP (PERSONAL CARE ITEMS) ×2 IMPLANT
SCISSORS METZENBAUM CVD 33 (INSTRUMENTS) ×1 IMPLANT
SCRUB EXIDINE 4% CHG 4OZ (MISCELLANEOUS) ×2 IMPLANT
SET TUBE SMOKE EVAC HIGH FLOW (TUBING) ×2 IMPLANT
SHEARS HARMONIC ACE PLUS 36CM (ENDOMECHANICALS) ×1 IMPLANT
SLEEVE ENDOPATH XCEL 5M (ENDOMECHANICALS) ×6 IMPLANT
SPONGE GAUZE 2X2 8PLY STRL LF (GAUZE/BANDAGES/DRESSINGS) ×4 IMPLANT
STRIP CLOSURE SKIN 1/2X4 (GAUZE/BANDAGES/DRESSINGS) ×2 IMPLANT
SUT VIC AB 0 CT1 36 (SUTURE) ×1 IMPLANT
SUT VIC AB 2-0 UR6 27 (SUTURE) ×1 IMPLANT
SUT VIC AB 4-0 SH 27 (SUTURE) ×4
SUT VIC AB 4-0 SH 27XANBCTRL (SUTURE) ×2 IMPLANT
SWABSTK COMLB BENZOIN TINCTURE (MISCELLANEOUS) ×2 IMPLANT
TROCAR ENDO BLADELESS 11MM (ENDOMECHANICALS) ×2 IMPLANT
TROCAR XCEL NON-BLD 5MMX100MML (ENDOMECHANICALS) ×2 IMPLANT
WATER STERILE IRR 500ML POUR (IV SOLUTION) ×2 IMPLANT

## 2021-06-30 NOTE — Anesthesia Preprocedure Evaluation (Signed)
Anesthesia Evaluation  Patient identified by MRN, date of birth, ID band Patient awake    Reviewed: Allergy & Precautions, NPO status , Patient's Chart, lab work & pertinent test results  History of Anesthesia Complications (+) PONV  Airway Mallampati: II  TM Distance: >3 FB Neck ROM: full    Dental  (+) Teeth Intact   Pulmonary neg pulmonary ROS, asthma , former smoker,    Pulmonary exam normal breath sounds clear to auscultation       Cardiovascular Exercise Tolerance: Good hypertension, negative cardio ROS Normal cardiovascular exam+ dysrhythmias  Rhythm:Regular Rate:Normal     Neuro/Psych  Headaches, Anxiety Depression Legally blind... negative neurological ROS  negative psych ROS   GI/Hepatic negative GI ROS, Neg liver ROS,   Endo/Other  negative endocrine ROS  Renal/GU   negative genitourinary   Musculoskeletal  (+) Arthritis ,   Abdominal Normal abdominal exam  (+)   Peds negative pediatric ROS (+)  Hematology negative hematology ROS (+) Blood dyscrasia, anemia ,   Anesthesia Other Findings Past Medical History: No date: Anemia No date: Anxiety No date: Arthritis     Comment:  RA No date: Asthma     Comment:  WELL CONTROLLED No date: Chronic kidney disease     Comment:  H/O STONES No date: Chronic pain No date: Complication of anesthesia No date: Depression 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 No date: Migraines No date: PONV (postoperative nausea and vomiting) 2016: Seizures ([place])     Comment:  LAST SEIZURE 1-1.5 YRS  AGO No date: Sleep apnea     Comment:  DOES NOT USE CPAP  Past Surgical History: No date: APPENDECTOMY No date: COLONOSCOPY WITH ESOPHAGOGASTRODUODENOSCOPY (EGD) 11/2015: HAND TENDON SURGERY; Bilateral No date: HYSTEROSCOPY 03/31/2017: LAPAROSCOPIC APPENDECTOMY     Comment:  performed with removal of ovary, at [facility] 10/14/2017: LAPAROSCOPIC CHOLECYSTECTOMY     Comment:  performed at [facility] No date: LAPAROSCOPIC ENDOMETRIOSIS FULGURATION     Comment:  03/2017, and 06/2018 03/09/2016: LAPAROSCOPY; N/A     Comment:  Procedure: LAPAROSCOPY DIAGNOSTIC and D AND C;  Surgeon:              Rubie Maid, MD;  Location: [facility] ORS;  Service:               Gynecology;  Laterality: N/A; No date: TOTAL LAPAROSCOPIC HYSTERECTOMY WITH BILATERAL SALPINGO  OOPHORECTOMY     Comment:  hysterectomy with unilateral ovary removal on               03/31/2017, removal of remaining ovary in 06/15/2018  BMI    Body Mass Index: 26.26 kg/m      Reproductive/Obstetrics negative OB ROS                             Anesthesia Physical Anesthesia Plan  ASA: 2  Anesthesia Plan: General   Post-op Pain Management:    Induction: Intravenous  PONV Risk Score and Plan: 2 and Ondansetron and Dexamethasone  Airway Management Planned: Oral ETT  Additional Equipment:   Intra-op Plan:   Post-operative Plan: Extubation  in OR  Informed Consent: I have reviewed the patients History and Physical, chart, labs and discussed the procedure including the risks, benefits and alternatives for the proposed anesthesia with the patient or authorized representative who has indicated his/her understanding and acceptance.     Dental Advisory Given  Plan Discussed with: CRNA and Surgeon  Anesthesia Plan Comments:         Anesthesia Quick Evaluation

## 2021-06-30 NOTE — Progress Notes (Signed)
Pt ready for operative L/S . Labs reviewed . All questions answered . Proceed

## 2021-06-30 NOTE — Transfer of Care (Signed)
Immediate Anesthesia Transfer of Care Note  Patient: Grace Norris  Procedure(s) Performed: LAPAROSCOPY DIAGNOSTIC, POSSIBLE ENDOMETRIOSIS EXCISION  Patient Location: PACU  Anesthesia Type:General  Level of Consciousness: drowsy  Airway & Oxygen Therapy: Patient Spontanous Breathing  Post-op Assessment: Report given to RN and Post -op Vital signs reviewed and stable  Post vital signs: stable     Last Vitals:  Vitals Value Taken Time  BP 115/98 06/30/21 1234  Temp 36.4 C 06/30/21 1234  Pulse 99 06/30/21 1242  Resp 10 06/30/21 1242  SpO2 99 % 06/30/21 1242  Vitals shown include unvalidated device data.  Last Pain:  Vitals:   06/30/21 1234  TempSrc:   PainSc: 0-No pain         Complications: No notable events documented.

## 2021-06-30 NOTE — Anesthesia Procedure Notes (Addendum)
Procedure Name: Intubation Date/Time: 06/30/2021 10:37 AM Performed by: Cammie Sickle, CRNA Pre-anesthesia Checklist: Patient identified, Emergency Drugs available, Suction available and Patient being monitored Patient Re-evaluated:Patient Re-evaluated prior to induction Oxygen Delivery Method: Circle system utilized Preoxygenation: Pre-oxygenation with 100% oxygen Induction Type: IV induction Ventilation: Mask ventilation without difficulty Laryngoscope Size: McGraph and 3 Grade View: Grade I Tube type: Oral Tube size: 7.0 mm Number of attempts: 1 Airway Equipment and Method: Stylet Placement Confirmation: positive ETCO2, breath sounds checked- equal and bilateral, ETT inserted through vocal cords under direct vision and CO2 detector Secured at: 21 cm Tube secured with: Tape Dental Injury: Teeth and Oropharynx as per pre-operative assessment

## 2021-06-30 NOTE — Brief Op Note (Signed)
06/30/2021  12:21 PM  PATIENT:  Grace Norris  31 y.o. female  PRE-OPERATIVE DIAGNOSIS:  chronic pelvic pain, dyspareunia, endometriosis  POST-OPERATIVE DIAGNOSIS:  chronic pelvic pain, dyspareunia, endometriosis  PROCEDURE:  laparoscopic excision of endometriosis, scar tissue and pelvic adhesions   SURGEON:  Surgeon(s) and Role:    * Ralpheal Zappone, Gwen Her, MD - Primary    * Benjaman Kindler, MD - Assisting  PHYSICIAN ASSISTANT: Pa student M. Oconnell  ASSISTANTS: none   ANESTHESIA:   spinal  EBL:  10 mL , IOF 1100  UO 100  BLOOD ADMINISTERED:none  DRAINS: none   LOCAL MEDICATIONS USED:  MARCAINE     SPECIMEN:  Source of Specimen:  excision of endometriosis  and scar tissue   DISPOSITION OF SPECIMEN:  PATHOLOGY  COUNTS:  YES  TOURNIQUET:  * No tourniquets in log *  DICTATION: .Other Dictation: Dictation Number verbal   PLAN OF CARE: Discharge to home after PACU  PATIENT DISPOSITION:  PACU - hemodynamically stable.   Delay start of Pharmacological VTE agent (>24hrs) due to surgical blood loss or risk of bleeding: not applicable

## 2021-06-30 NOTE — Anesthesia Postprocedure Evaluation (Signed)
Anesthesia Post Note  Patient: Grace Norris  Procedure(s) Performed: LAPAROSCOPIC EXCISION OF ENDOMETRIOSIS (Abdomen)  Patient location during evaluation: PACU Anesthesia Type: General Level of consciousness: awake and awake and alert Pain management: satisfactory to patient Vital Signs Assessment: post-procedure vital signs reviewed and stable Respiratory status: spontaneous breathing and respiratory function stable Cardiovascular status: stable Anesthetic complications: no   No notable events documented.   Last Vitals:  Vitals:   06/30/21 1245 06/30/21 1250  BP: 130/83   Pulse: 100 97  Resp: (!) 35 13  Temp:    SpO2: 100% 99%    Last Pain:  Vitals:   06/30/21 1250  TempSrc:   PainSc: 8                  VAN STAVEREN,Devin Foskey

## 2021-06-30 NOTE — Discharge Instructions (Signed)

## 2021-06-30 NOTE — Progress Notes (Signed)
°   06/30/21 0915  Clinical Encounter Type  Visited With Patient  Visit Type Initial;Pre-op;Spiritual support  Spiritual Encounters  Spiritual Needs Prayer   Chaplain was called to visit pre-op patient who was fearful and anxious. Chaplain provided support through compassionate presence, meaningful conversation and prayer

## 2021-07-01 ENCOUNTER — Encounter: Payer: Self-pay | Admitting: Obstetrics and Gynecology

## 2021-07-01 LAB — SURGICAL PATHOLOGY

## 2021-07-01 NOTE — Op Note (Signed)
NAME: Leja, Jerolene K. MEDICAL RECORD NO: [id number] ACCOUNT NO: [id number] DATE OF BIRTH: [birth year] FACILITY: [facility] LOCATION: [facility] PHYSICIAN: Boykin Nearing, MD  Operative Report   DATE OF PROCEDURE: 06/30/2021   PREOPERATIVE DIAGNOSIS:   1.  Chronic pelvic pain. 2.  Prior history of endometriosis. 3.  Dyspareunia.  POSTOPERATIVE DIAGNOSES:   1.  Chronic pelvic pain. 2.  Prior history of endometriosis. 3.  Dyspareunia. 4.  Pelvic adhesions.   5. Evidence of endometriosis.  PROCEDURE:   1.  Laparoscopic excision of endometriosis - multiple sites. 2.  Excision of scar tissue. 3.  Pelvic adhesiolysis.  ANESTHESIA:  General endotracheal anesthesia.  SURGEON:  Boykin Nearing, MD  FIRST ASSISTANT:  Benjaman Kindler, MD  INDICATIONS:  A 31 year old gravida 0, patient that has a long history of laparoscopic pathology proven endometriosis.  She is status post hysterectomy and bilateral salpingo-oophorectomy and has continued central pelvic pain, dyspareunia.  DESCRIPTION OF PROCEDURE:  After adequate general endotracheal anesthesia, the patient was placed in the dorsal supine position with the legs in the [place] stirrups.  The patient's abdomen, perineum and vagina were prepped and draped in normal sterile  fashion.  Timeout was performed.  Foley catheter was placed in the bladder to be used for urinary drainage during the procedure.  A double wrap sponge stick was placed into the vagina to be used for vaginal manipulation during the procedure.  Gloves and  gown were changed.  Attention was directed to the patient's abdomen where a 5 mm infraumbilical incision was made. After injecting with 0.5% Marcaine, the hysteroscope, the 5 mm laparoscope was advanced into the abdominal cavity under direct  visualization with the Optiview cannula.  The patient's abdomen was then insufflated.  A second port site was placed in left lower quadrant, 3 cm medial to the left  anterior iliac spine.  A third port was placed.  Again, a 5 mm port 3 cm medial to the  right anterior iliac spine was advanced under direct visualization.  Initial impression revealed multiple powder burn areas consistent with residual endometriosis.  Of note, the bladder flap, the right and left posterior cul-de-sac, the right and left  pelvic sidewall areas all had consistent with either powder burn areas or scarification consistent with endometriosis.  There was a dense adhesion of epiploica from the colon adhesed to the vaginal cuff.  Harmonic scalpel was brought up to the operative  field and advanced into the abdominal cavity.  The vaginal cuff adhesion was taken down sharply and then filmy adhesions were taken down draping from the vaginal cuff to the bowel.  Multiple excisional biopsies were then done from the right sidewall.   The right posterior cul-de-sac, the left posterior cul-de-sac, the left side wall, the bladder flap.  Either the lesions were clearly powder burn areas or dense white raised nodular lesions, which were tented away from the sidewall and dissected  meticulously.  The patient was noted to have duplicating ureters either unilateral or bilateral in the past.  Records are not clearly sure in interpretation of this fact.  After the multiple excisional biopsies were removed all sent to pathology will be  identified for residual endometriosis.  Good hemostasis was noted.  The patient's abdomen was irrigated and the intra-abdominal pressure was dropped to 8 mmHg and good hemostasis was noted.  The right and left ureters at least one of each was identified  pre and after dissection, there was not a duplication of the ureter with peristalsis  that could be evaluated intraoperatively.  The procedure was then terminated with deflation of the patient's abdomen and the 3 port sites were then closed with  interrupted 4-0 Vicryl suture and sterile dressings applied.  The Foley catheter was  removed and the sponge stick was removed.  There were no complications.  The patient tolerated the procedure well and was taken to recovery room in good condition.  ESTIMATED BLOOD LOSS:  Minimal.  INTRAOPERATIVE FLUIDS:  1100 mL.  URINE OUTPUT:  100 mL.  She did receive 30 mg intravenous Toradol at the end of the case.     SUJ D: 06/30/2021 5:55:47 pm T: 07/01/2021 4:22:00 am  JOB: [id number]/ [id number]

## 2021-07-29 ENCOUNTER — Other Ambulatory Visit: Payer: Self-pay | Admitting: Ophthalmology

## 2021-07-29 DIAGNOSIS — H5462 Unqualified visual loss, left eye, normal vision right eye: Secondary | ICD-10-CM

## 2021-08-07 ENCOUNTER — Ambulatory Visit: Payer: Medicare Other

## 2021-09-02 ENCOUNTER — Other Ambulatory Visit: Payer: Self-pay | Admitting: Obstetrics and Gynecology

## 2021-09-02 DIAGNOSIS — N644 Mastodynia: Secondary | ICD-10-CM

## 2021-09-05 ENCOUNTER — Inpatient Hospital Stay
Admission: RE | Admit: 2021-09-05 | Discharge: 2021-09-05 | Disposition: A | Discharge status: Home / Self Care | Payer: Self-pay | Source: Ambulatory Visit | Attending: *Deleted | Admitting: *Deleted

## 2021-09-05 ENCOUNTER — Other Ambulatory Visit: Payer: Self-pay | Admitting: *Deleted

## 2021-09-05 DIAGNOSIS — Z1231 Encounter for screening mammogram for malignant neoplasm of breast: Secondary | ICD-10-CM

## 2021-09-24 ENCOUNTER — Ambulatory Visit
Admission: RE | Admit: 2021-09-24 | Discharge: 2021-09-24 | Disposition: A | Discharge status: Home / Self Care | Payer: Medicare Other | Source: Ambulatory Visit | Attending: Obstetrics and Gynecology | Admitting: Obstetrics and Gynecology

## 2021-09-24 DIAGNOSIS — N644 Mastodynia: Secondary | ICD-10-CM | POA: Insufficient documentation

## 2022-04-17 ENCOUNTER — Other Ambulatory Visit: Payer: Self-pay | Admitting: Emergency Medicine

## 2022-04-17 DIAGNOSIS — M25811 Other specified joint disorders, right shoulder: Secondary | ICD-10-CM

## 2022-04-18 ENCOUNTER — Other Ambulatory Visit: Payer: Self-pay

## 2022-04-18 ENCOUNTER — Emergency Department
Admission: EM | Admit: 2022-04-18 | Discharge: 2022-04-18 | Disposition: A | Discharge status: Home / Self Care | Payer: Medicare Other | Attending: Emergency Medicine | Admitting: Emergency Medicine

## 2022-04-18 DIAGNOSIS — Z7982 Long term (current) use of aspirin: Secondary | ICD-10-CM | POA: Diagnosis not present

## 2022-04-18 DIAGNOSIS — M75101 Unspecified rotator cuff tear or rupture of right shoulder, not specified as traumatic: Secondary | ICD-10-CM | POA: Diagnosis not present

## 2022-04-18 DIAGNOSIS — M25511 Pain in right shoulder: Secondary | ICD-10-CM | POA: Diagnosis present

## 2022-04-18 DIAGNOSIS — Z91040 Latex allergy status: Secondary | ICD-10-CM | POA: Insufficient documentation

## 2022-04-18 MED ORDER — TRAMADOL HCL 50 MG PO TABS
50.0000 mg | ORAL_TABLET | Freq: Four times a day (QID) | ORAL | 0 refills | Status: AC | PRN
Start: 2022-04-18 — End: 2023-04-18

## 2022-04-18 MED ORDER — KETOROLAC TROMETHAMINE 30 MG/ML IJ SOLN
30.0000 mg | Freq: Once | INTRAMUSCULAR | Status: AC
  Administered 2022-04-18: 30 mg via INTRAMUSCULAR
  Filled 2022-04-18: qty 1

## 2022-04-18 NOTE — ED Triage Notes (Signed)
Pt reports injured her right shoulder about a month ago and has been seen by an orthopedic MD for it but continues to have pain. Pt reports pain keeps her awake at night.

## 2022-04-18 NOTE — ED Provider Notes (Addendum)
[name] Provider Note   CSN: [id number] Arrival date & time: 04/18/22  0945     History  Chief Complaint  Patient presents with   Shoulder Injury    Grace Norris is a 31 y.o. female presents to the emergency department for evaluation of shoulder pain.  Patient's had at least 1 month of shoulder pain in the right shoulder, states she has been doing some exercising and boxing that could have aggravated it.  She is had significant pain with overhead lifting pushing and pulling.  She has some numbness in the right hand but no pain or numbness in the neck.  Her numbness is worse with holding things and also at nighttime.  Numbness goes into the thumb and index finger.  Patient underwent x-rays by orthopedist 1 month ago showing no evidence of arthritis, abnormal bony lesions or acute bony abnormality.  She has not been taking any medications for her symptoms. HPI     Home Medications Prior to Admission medications   Medication Sig Start Date End Date Taking? Authorizing Provider  traMADol (ULTRAM) 50 MG tablet Take 1 tablet (50 mg total) by mouth every 6 (six) hours as needed. 04/18/22 04/18/23 Yes Duanne Guess, PA-C  acetaminophen (TYLENOL 8 HOUR) 650 MG CR tablet Take 1 tablet (650 mg total) by mouth every 8 (eight) hours as needed for pain (mild). 03/09/16   Rubie Maid, MD  albuterol (PROVENTIL HFA;VENTOLIN HFA) 108 (90 Base) MCG/ACT inhaler Inhale 2 puffs into the lungs every 6 (six) hours as needed for wheezing or shortness of breath. 04/14/18   Rudene Re, MD  aspirin 81 MG chewable tablet Chew by mouth every other day.    [provider]  Eptinezumab-jjmr (VYEPTI IV) Inject into the vein. Q 3 months for migraines    [provider]  fluticasone (FLONASE) 50 MCG/ACT nasal spray Place 2 sprays into both nostrils as needed for allergies or rhinitis.    [provider]  lamoTRIgine (LAMICTAL) 150  MG tablet Take 150 mg by mouth 2 (two) times daily. 05/10/21   [provider]  linaclotide Rolan Lipa) 290 MCG CAPS capsule Take 290 mcg by mouth daily before breakfast. 05/10/19   [provider]  LORazepam (ATIVAN) 0.5 MG tablet Take 0.5 mg by mouth 2 (two) times daily as needed for anxiety.    [provider]  melatonin 5 MG TABS Take 5 mg by mouth at bedtime.    [provider]  Menaquinone-7 (VITAMIN K2) 100 MCG CAPS Take 100 mcg by mouth daily.    [provider]  Multiple Vitamin (MULTI-VITAMINS) TABS Take by mouth.    [provider]  omeprazole (PRILOSEC) 20 MG capsule Take 20 mg by mouth 2 (two) times daily before a meal.    [provider]  OVER THE COUNTER MEDICATION Take 2 capsules by mouth daily. Amberin Multi Symptom 1- orange and 1 white capsule daily    [provider]  prazosin (MINIPRESS) 2 MG capsule Take 8 mg by mouth at bedtime.    [provider]  promethazine (PHENERGAN) 25 MG tablet Take 25 mg by mouth every 6 (six) hours as needed for nausea or vomiting.    [provider]  clonazePAM (KLONOPIN) 0.5 MG tablet Take by mouth. 01/16/17 05/22/19  [provider]  medroxyPROGESTERone (PROVERA) 10 MG tablet Take 1 tablet (10 mg total) by mouth daily. Take 1 tablet for 7 days each month. 08/25/18 05/22/19  Rubie Maid, MD  traZODone (DESYREL) 50 MG tablet 100 mg.   05/22/19  [provider]      Allergies    Aspartame, Penicillins, Latex, Prednisone, Gabapentin, Gluten meal, Lactose intolerance (gi), Ibuprofen, Lavender oil, and Oxycodone    Review of Systems   Review of Systems  Physical Exam Updated Vital Signs BP 125/84 (BP Location: Left Arm)   Pulse 97   Temp 98.9 F (37.2 C) (Oral)   Resp 16   Ht '5\' 4"'$  (1.626 m)   Wt 70 kg   LMP 12/26/2016 (Exact Date)   SpO2 97%   BMI 26.49 kg/m  Physical Exam Constitutional:      Appearance: She is well-developed.   HENT:     Head: Normocephalic and atraumatic.  Eyes:     Conjunctiva/sclera: Conjunctivae normal.  Cardiovascular:     Rate and Rhythm: Normal rate.  Pulmonary:     Effort: Pulmonary effort is normal. No respiratory distress.  Musculoskeletal:        General: Normal range of motion.     Cervical back: Normal range of motion.     Comments: Right shoulder with active abduction to 90 degrees.  Passively I can increase her to 120 degrees but with pain.  She has positive Hawkins and impingement test.  She has good internal/external rotation with only mild discomfort.  She is not tender along the [facility] joint but does have proximal humerus pain along the subacromial space.  She has full range of motion of the elbow wrist and digits.  She is neurovascular intact in right upper extremity.  She has no neck tenderness with normal neck range of motion on exam.  Negative Spurling's test bilaterally  Skin:    General: Skin is warm.     Findings: No rash.  Neurological:     Mental Status: She is alert and oriented to person, place, and time.  Psychiatric:        Behavior: Behavior normal.        Thought Content: Thought content normal.     ED Results / Procedures / Treatments   Labs (all labs ordered are listed, but only abnormal results are displayed) Labs Reviewed - No data to display  EKG None  Radiology No results found.  Procedures Procedures   Medications Ordered in ED Medications  ketorolac (TORADOL) 30 MG/ML injection 30 mg (has no administration in time range)    ED Course/ Medical Decision Making/ A&P                           Medical Decision Making Risk Prescription drug management.   31 year old female with right shoulder pain.  She has positive impingement signs on history and exam.  She is started on Toradol 30 mg IM.  Kidney function reviewed showed within normal limits.  She will take Tylenol at home due to allergies to ibuprofen and prednisone.  She is given  prescription for tramadol to help with moderate to severe pain as needed.  She is scheduled for a right shoulder MRI and 4 days.  She will call her orthopedist after MRI to discuss results.  Patient understands signs symptoms return to the ER for. Final Clinical Impression(s) / ED Diagnoses Final diagnoses:  Rotator cuff syndrome, right    Rx / DC Orders ED Discharge Orders          Ordered    traMADol (ULTRAM) 50 MG tablet  Every 6 hours  PRN        04/18/22 1152              Duanne Guess, PA-C 04/18/22 1157    Duanne Guess, PA-C 04/18/22 1157    Duffy Bruce, MD 04/29/22 [phone]

## 2022-04-18 NOTE — Discharge Instructions (Signed)
Please take Tylenol 1000 mg every 6 hours along with tramadol 50 mg every 6 hours as needed for pain.  Call orthopedic office on Monday to schedule follow-up appointment with shoulder specialist.  Return to the ER for any worsening symptoms or any urgent changes in your health

## 2022-04-18 NOTE — ED Notes (Signed)
Pt to ED for R shoulder pain since 1 month. Denies known injury. States lifting arm or moving arm forward or backward is extremely painful. Fingers are pink with normal cap refill and motor strength. States at times whole arm becomes numb since onset of shoulder pain.

## 2022-04-22 ENCOUNTER — Ambulatory Visit
Admission: RE | Admit: 2022-04-22 | Discharge: 2022-04-22 | Disposition: A | Discharge status: Home / Self Care | Payer: Medicare Other | Source: Ambulatory Visit | Attending: Emergency Medicine | Admitting: Emergency Medicine

## 2022-04-22 ENCOUNTER — Ambulatory Visit: Payer: Medicare Other

## 2022-04-22 DIAGNOSIS — M25811 Other specified joint disorders, right shoulder: Secondary | ICD-10-CM | POA: Insufficient documentation

## 2022-06-13 ENCOUNTER — Other Ambulatory Visit: Payer: Self-pay | Admitting: Orthopedic Surgery

## 2022-06-13 DIAGNOSIS — M5412 Radiculopathy, cervical region: Secondary | ICD-10-CM

## 2022-06-13 DIAGNOSIS — M542 Cervicalgia: Secondary | ICD-10-CM

## 2022-06-24 ENCOUNTER — Ambulatory Visit
Admission: RE | Admit: 2022-06-24 | Discharge: 2022-06-24 | Disposition: A | Discharge status: Home / Self Care | Payer: Medicare Other | Source: Ambulatory Visit | Attending: Orthopedic Surgery | Admitting: Orthopedic Surgery

## 2022-06-24 DIAGNOSIS — M542 Cervicalgia: Secondary | ICD-10-CM

## 2022-06-24 DIAGNOSIS — M5412 Radiculopathy, cervical region: Secondary | ICD-10-CM

## 2022-12-07 ENCOUNTER — Encounter: Payer: Self-pay | Admitting: Emergency Medicine

## 2022-12-07 ENCOUNTER — Emergency Department
Admission: EM | Admit: 2022-12-07 | Discharge: 2022-12-07 | Disposition: A | Discharge status: Home / Self Care | Payer: Medicare Other | Attending: Emergency Medicine | Admitting: Emergency Medicine

## 2022-12-07 ENCOUNTER — Other Ambulatory Visit: Payer: Self-pay

## 2022-12-07 DIAGNOSIS — Z87898 Personal history of other specified conditions: Secondary | ICD-10-CM

## 2022-12-07 DIAGNOSIS — R569 Unspecified convulsions: Secondary | ICD-10-CM | POA: Insufficient documentation

## 2022-12-07 DIAGNOSIS — R42 Dizziness and giddiness: Secondary | ICD-10-CM | POA: Insufficient documentation

## 2022-12-07 LAB — BASIC METABOLIC PANEL
Anion gap: 7 (ref 5–15)
BUN: 12 mg/dL (ref 6–20)
CO2: 24 mmol/L (ref 22–32)
Calcium: 9.1 mg/dL (ref 8.9–10.3)
Chloride: 107 mmol/L (ref 98–111)
Creatinine, Ser: 0.78 mg/dL (ref 0.44–1.00)
GFR, Estimated: >60 mL/min (ref >60)
Glucose, Bld: 102 mg/dL — ABNORMAL HIGH (ref 70–99)
Potassium: 4.6 mmol/L (ref 3.5–5.1)
Sodium: 138 mmol/L (ref 135–145)

## 2022-12-07 LAB — CBC
HCT: 44.1 % (ref 36.0–46.0)
Hemoglobin: 14.4 g/dL (ref 12.0–15.0)
MCH: 28.7 pg (ref 26.0–34.0)
MCHC: 32.7 g/dL (ref 30.0–36.0)
MCV: 87.8 fL (ref 80.0–100.0)
Platelets: 266 10*3/uL (ref 150–400)
RBC: 5.02 MIL/uL (ref 3.87–5.11)
RDW: 12.4 % (ref 11.5–15.5)
WBC: 4.4 10*3/uL (ref 4.0–10.5)
nRBC: 0 % (ref 0.0–0.2)

## 2022-12-07 LAB — URINALYSIS, ROUTINE W REFLEX MICROSCOPIC
Bilirubin Urine: NEGATIVE
Glucose, UA: NEGATIVE mg/dL
Hgb urine dipstick: NEGATIVE
Ketones, ur: NEGATIVE mg/dL
Leukocytes,Ua: NEGATIVE
Nitrite: NEGATIVE
Protein, ur: NEGATIVE mg/dL
Specific Gravity, Urine: 1.014 (ref 1.005–1.030)
pH: 5 (ref 5.0–8.0)

## 2022-12-07 MED ORDER — LORAZEPAM 1 MG PO TABS
0.5000 mg | ORAL_TABLET | Freq: Once | ORAL | Status: AC
  Administered 2022-12-07: 0.5 mg via ORAL
  Filled 2022-12-07: qty 1

## 2022-12-07 NOTE — ED Triage Notes (Signed)
Pt comes with c/o felling like she might have a seizure. Pt states she has felt this way for about 24 hours now. Pt states she does take all her meds like she is suppose to.

## 2022-12-07 NOTE — ED Provider Notes (Signed)
[facility] Provider Note    Event Date/Time   First MD Initiated Contact with Patient 12/07/22 1020     (approximate)   History   Chief Complaint Seizures   HPI  Grace Norris is a 32 y.o. female with past medical history of endometriosis status post hysterectomy, seizures, and migraines who presents to the ED complaining of lightheadedness.  Patient reports that she has been feeling lightheaded and dizzy for about the past 24 hours.  She describes this as her "seizure aura" and states that she has been feeling like she is about to have a seizure.  She states that her last seizure was multiple years ago and she has been compliant with her lamotrigine, does not take any other seizure medications.  She does state that her sleep has been decreased recently, was recently started on Lunesta and is concerned this could also be contributing to her symptoms.  She denies any fevers, headache, vision changes, speech changes, numbness, weakness, vomiting, or diarrhea.     Physical Exam   Triage Vital Signs: ED Triage Vitals  Enc Vitals Group     BP 12/07/22 1001 115/72     Pulse Rate 12/07/22 1001 75     Resp 12/07/22 1001 18     Temp 12/07/22 1001 98.2 F (36.8 C)     Temp src --      SpO2 12/07/22 1001 100 %     Weight 12/07/22 1027 154 lb 5.2 oz (70 kg)     Height 12/07/22 1027 5\' 4"  (1.626 m)     Head Circumference --      Peak Flow --      Pain Score 12/07/22 1000 0     Pain Loc --      Pain Edu? --      Excl. in GC? --     Most recent vital signs: Vitals:   12/07/22 1001  BP: 115/72  Pulse: 75  Resp: 18  Temp: 98.2 F (36.8 C)  SpO2: 100%    Constitutional: Alert and oriented. Eyes: Conjunctivae are normal. Head: Atraumatic. Nose: No congestion/rhinnorhea. Mouth/Throat: Mucous membranes are moist.  Cardiovascular: Normal rate, regular rhythm. Grossly normal heart sounds.  2+ radial pulses bilaterally. Respiratory: Normal respiratory  effort.  No retractions. Lungs CTAB. Gastrointestinal: Soft and nontender. No distention. Musculoskeletal: No lower extremity tenderness nor edema.  Neurologic:  Normal speech and language. No gross focal neurologic deficits are appreciated.    ED Results / Procedures / Treatments   Labs (all labs ordered are listed, but only abnormal results are displayed) Labs Reviewed  BASIC METABOLIC PANEL - Abnormal; Notable for the following components:      Result Value   Glucose, Bld 102 (*)    All other components within normal limits  URINALYSIS, ROUTINE W REFLEX MICROSCOPIC - Abnormal; Notable for the following components:   Color, Urine YELLOW (*)    APPearance CLEAR (*)    All other components within normal limits  CBC  LAMOTRIGINE LEVEL  CBG MONITORING, ED     EKG  ED ECG REPORT I, Chesley Noon, the attending physician, personally viewed and interpreted this ECG.   Date: 12/07/2022  EKG Time: 11:28  Rate: 64  Rhythm: normal sinus rhythm  Axis: Normal  Intervals:none  ST&T Change: None  PROCEDURES:  Critical Care performed: No  Procedures   MEDICATIONS ORDERED IN ED: Medications  LORazepam (ATIVAN) tablet 0.5 mg (0.5 mg Oral Given 12/07/22 1131)  IMPRESSION / MDM / ASSESSMENT AND PLAN / ED COURSE  I reviewed the triage vital signs and the nursing notes.                              32 y.o. female with past medical history of endometriosis, seizures, and migraines who presents to the ED complaining of dizziness and lightheadedness since yesterday with feeling like she is going to have a seizure.  Patient's presentation is most consistent with acute presentation with potential threat to life or bodily function.  Differential diagnosis includes, but is not limited to, electrolyte abnormality, AKI, UTI, sleep deprivation, medication effect.  Patient well-appearing and in no acute distress, vital signs are unremarkable.  She has no focal neurologic deficits on  exam, doubt intracranial process.  Labs are reassuring with no significant anemia, leukocytosis, lecture abnormality, or AKI.  She states she has been compliant with her lamotrigine, will send for level but results will need to be followed by by her PCP or neurologist.  We will give dose of Ativan for seizure prophylaxis, urinalysis pending at this time.  EKG shows no evidence of arrhythmia or ischemia, urinalysis with no signs of infection or other concerning findings.  Patient reports feeling better after dose of Ativan and is appropriate for discharge home with neurology follow-up.  She was counseled to return to the ED for new or worsening symptoms, may follow-up with neurologist regarding lamotrigine level which is pending at this time.  Patient agrees with plan.      FINAL CLINICAL IMPRESSION(S) / ED DIAGNOSES   Final diagnoses:  History of seizures  Lightheadedness     Rx / DC Orders   ED Discharge Orders     None        Note:  This document was prepared using Dragon voice recognition software and may include unintentional dictation errors.   Chesley Noon, MD 12/07/22 [phone]

## 2022-12-08 LAB — LAMOTRIGINE LEVEL: Lamotrigine Lvl: 5.5 ug/mL (ref 2.0–20.0)

## 2023-04-06 ENCOUNTER — Other Ambulatory Visit: Payer: Self-pay | Admitting: Physical Medicine & Rehabilitation

## 2023-04-06 DIAGNOSIS — M5441 Lumbago with sciatica, right side: Secondary | ICD-10-CM

## 2023-04-16 ENCOUNTER — Ambulatory Visit
Admission: RE | Admit: 2023-04-16 | Discharge: 2023-04-16 | Disposition: A | Discharge status: Home / Self Care | Payer: Medicare Other | Source: Ambulatory Visit | Attending: Physical Medicine & Rehabilitation | Admitting: Physical Medicine & Rehabilitation

## 2023-04-16 DIAGNOSIS — M5441 Lumbago with sciatica, right side: Secondary | ICD-10-CM

## 2023-06-11 ENCOUNTER — Other Ambulatory Visit: Payer: Self-pay

## 2023-06-11 ENCOUNTER — Emergency Department
Admission: EM | Admit: 2023-06-11 | Discharge: 2023-06-11 | Disposition: A | Discharge status: Home / Self Care | Payer: Medicare Other | Attending: Emergency Medicine | Admitting: Emergency Medicine

## 2023-06-11 DIAGNOSIS — N939 Abnormal uterine and vaginal bleeding, unspecified: Secondary | ICD-10-CM | POA: Insufficient documentation

## 2023-06-11 LAB — BASIC METABOLIC PANEL
Anion gap: 11 (ref 5–15)
BUN: 15 mg/dL (ref 6–20)
CO2: 23 mmol/L (ref 22–32)
Calcium: 9.7 mg/dL (ref 8.9–10.3)
Chloride: 104 mmol/L (ref 98–111)
Creatinine, Ser: 0.91 mg/dL (ref 0.44–1.00)
GFR, Estimated: >60 mL/min (ref >60)
Glucose, Bld: 98 mg/dL (ref 70–99)
Potassium: 4.3 mmol/L (ref 3.5–5.1)
Sodium: 138 mmol/L (ref 135–145)

## 2023-06-11 LAB — URINALYSIS, ROUTINE W REFLEX MICROSCOPIC
Bilirubin Urine: NEGATIVE
Glucose, UA: NEGATIVE mg/dL
Ketones, ur: NEGATIVE mg/dL
Leukocytes,Ua: NEGATIVE
Nitrite: NEGATIVE
Protein, ur: NEGATIVE mg/dL
Specific Gravity, Urine: 1.004 — ABNORMAL LOW (ref 1.005–1.030)
pH: 7 (ref 5.0–8.0)

## 2023-06-11 LAB — CBC
HCT: 41.1 % (ref 36.0–46.0)
Hemoglobin: 13.9 g/dL (ref 12.0–15.0)
MCH: 29.3 pg (ref 26.0–34.0)
MCHC: 33.8 g/dL (ref 30.0–36.0)
MCV: 86.5 fL (ref 80.0–100.0)
Platelets: 308 10*3/uL (ref 150–400)
RBC: 4.75 MIL/uL (ref 3.87–5.11)
RDW: 12.5 % (ref 11.5–15.5)
WBC: 5.9 10*3/uL (ref 4.0–10.5)
nRBC: 0 % (ref 0.0–0.2)

## 2023-06-11 MED ORDER — OXYCODONE HCL 5 MG PO TABS
5.0000 mg | ORAL_TABLET | Freq: Once | ORAL | Status: AC
  Administered 2023-06-11: 5 mg via ORAL
  Filled 2023-06-11: qty 1

## 2023-06-11 MED ORDER — KETOROLAC TROMETHAMINE 30 MG/ML IJ SOLN
30.0000 mg | Freq: Once | INTRAMUSCULAR | Status: AC
  Administered 2023-06-11: 30 mg via INTRAMUSCULAR
  Filled 2023-06-11: qty 1

## 2023-06-11 MED ORDER — ACETAMINOPHEN 500 MG PO TABS
1000.0000 mg | ORAL_TABLET | Freq: Once | ORAL | Status: AC
  Administered 2023-06-11: 1000 mg via ORAL
  Filled 2023-06-11: qty 2

## 2023-06-11 NOTE — ED Notes (Signed)
 Patient declined discharge vital signs.

## 2023-06-11 NOTE — ED Triage Notes (Signed)
 Pt to ED for vaginal bleeding started this am. Reports had surgery for endometriosis 4 weeks ago. Reports going through 3 pads today. C/o lower abd pain. NAD noted. Skin color WDL

## 2023-06-11 NOTE — ED Provider Notes (Signed)
 [facility] Provider Note    Event Date/Time   First MD Initiated Contact with Patient 06/11/23 1236     (approximate)   History   Vaginal Bleeding   HPI  Grace Norris is a 33 y.o. female who presents to the ED for evaluation of Vaginal Bleeding   I review a [facility] gynecology visit from 12/23.  G1 A0 history of endometriosis s/p hysterectomy, bilateral salpingectomy, appendectomy and multiple previous excisional surgeries and has residual chronic pain.  Most recent excision was 12/13.   Patient presents to the ED due to vaginal bleeding.  She reports having some bleeding last weekend and she was able to be seen in the gynecology clinic this past Monday on 1/6.  Reports a gynecology fellow removed a blood clot from her vagina and she was feeling better.  Did not have bleeding for a couple days.  Bleeding recurred last night and became more heavy this morning similar as last weekend so she presents to the ED for evaluation.  Wanted to make sure that she was not bleeding out.  No fevers or other vaginal discharge.   Physical Exam   Triage Vital Signs: ED Triage Vitals [06/11/23 1022]  Encounter Vitals Group     BP (!) 147/84     Systolic BP Percentile      Diastolic BP Percentile      Pulse Rate (!) 103     Resp 18     Temp 98.3 F (36.8 C)     Temp src      SpO2 99 %     Weight 151 lb (68.5 kg)     Height 5' 4 (1.626 m)     Head Circumference      Peak Flow      Pain Score 9     Pain Loc      Pain Education      Exclude from Growth Chart     Most recent vital signs: Vitals:   06/11/23 1022  BP: (!) 147/84  Pulse: (!) 103  Resp: 18  Temp: 98.3 F (36.8 C)  SpO2: 99%    General: Awake, no distress.  Clearly uncomfortable CV:  Good peripheral perfusion.  Resp:  Normal effort.  Abd:  No distention.  MSK:  No deformity noted.  Neuro:  No focal deficits appreciated. Other:   Chaperoned pelvic exam with no active vaginal bleeding,  no blood in the vaginal vault.  No prolapse.  Intact suture line to the vaginal cuff without any active bleeding  ED Results / Procedures / Treatments   Labs (all labs ordered are listed, but only abnormal results are displayed) Labs Reviewed  URINALYSIS, ROUTINE W REFLEX MICROSCOPIC - Abnormal; Notable for the following components:      Result Value   Color, Urine YELLOW (*)    APPearance HAZY (*)    Specific Gravity, Urine 1.004 (*)    Hgb urine dipstick SMALL (*)    Bacteria, UA RARE (*)    All other components within normal limits  CBC  BASIC METABOLIC PANEL    EKG   RADIOLOGY   Official radiology report(s): No results found.  PROCEDURES and INTERVENTIONS:  Procedures  Medications  ketorolac  (TORADOL ) 30 MG/ML injection 30 mg (30 mg Intramuscular Given 06/11/23 1315)  acetaminophen  (TYLENOL ) tablet 1,000 mg (1,000 mg Oral Given 06/11/23 1315)  oxyCODONE  (Oxy IR/ROXICODONE ) immediate release tablet 5 mg (5 mg Oral Given 06/11/23 1315)     IMPRESSION /  MDM / ASSESSMENT AND PLAN / ED COURSE  I reviewed the triage vital signs and the nursing notes.  Differential diagnosis includes, but is not limited to, postoperative complication from recent vaginal cuff surgery, persistent blood clot, PID  {Patient presents with symptoms of an acute illness or injury that is potentially life-threatening.   Normal CBC, metabolic panel and urine.  Patient has had a hysterectomy.  We will provide analgesia and perform a speculum exam to assess for any possible etiologies of her bleeding.   Pelvic without any active bleeding or acute pathology.  Intact suture line from vaginal cuff repair last month.  Her pain is controlled and she is suitable for outpatient management GYN follow-up.  Clinical Course as of 06/11/23 1349  Fri Jun 11, 2023  1341 Pelvic exam performed and fairly well-tolerated.  No active bleeding.  Intact vaginal cuff and suture line. [DS]    Clinical Course User  Index [DS] Claudene Rover, MD     FINAL CLINICAL IMPRESSION(S) / ED DIAGNOSES   Final diagnoses:  Vaginal bleeding     Rx / DC Orders   ED Discharge Orders     None        Note:  This document was prepared using Dragon voice recognition software and may include unintentional dictation errors.   Claudene Rover, MD 06/11/23 1352

## 2023-07-15 ENCOUNTER — Other Ambulatory Visit: Payer: Self-pay | Admitting: Gastroenterology

## 2023-07-15 DIAGNOSIS — R1011 Right upper quadrant pain: Secondary | ICD-10-CM

## 2023-07-15 DIAGNOSIS — R112 Nausea with vomiting, unspecified: Secondary | ICD-10-CM

## 2023-07-15 DIAGNOSIS — R109 Unspecified abdominal pain: Secondary | ICD-10-CM

## 2023-07-26 ENCOUNTER — Ambulatory Visit
Admission: RE | Admit: 2023-07-26 | Discharge: 2023-07-26 | Disposition: A | Discharge status: Home / Self Care | Payer: Medicare Other | Source: Ambulatory Visit | Attending: Gastroenterology | Admitting: Gastroenterology

## 2023-07-26 DIAGNOSIS — R1011 Right upper quadrant pain: Secondary | ICD-10-CM | POA: Diagnosis present

## 2023-07-26 DIAGNOSIS — R109 Unspecified abdominal pain: Secondary | ICD-10-CM | POA: Insufficient documentation

## 2023-07-26 DIAGNOSIS — R112 Nausea with vomiting, unspecified: Secondary | ICD-10-CM | POA: Insufficient documentation

## 2023-07-26 MED ORDER — IOHEXOL 300 MG/ML  SOLN
100.0000 mL | Freq: Once | INTRAMUSCULAR | Status: AC | PRN
  Administered 2023-07-26: 100 mL via INTRAVENOUS

## 2023-12-01 ENCOUNTER — Other Ambulatory Visit: Payer: Self-pay | Admitting: Orthopedic Surgery

## 2023-12-01 DIAGNOSIS — M25562 Pain in left knee: Secondary | ICD-10-CM

## 2023-12-01 DIAGNOSIS — M2392 Unspecified internal derangement of left knee: Secondary | ICD-10-CM

## 2023-12-01 DIAGNOSIS — Z87828 Personal history of other (healed) physical injury and trauma: Secondary | ICD-10-CM

## 2023-12-01 DIAGNOSIS — M2352 Chronic instability of knee, left knee: Secondary | ICD-10-CM

## 2023-12-04 ENCOUNTER — Ambulatory Visit
Admission: RE | Admit: 2023-12-04 | Discharge: 2023-12-04 | Disposition: A | Discharge status: Home / Self Care | Source: Ambulatory Visit | Attending: Orthopedic Surgery | Admitting: Orthopedic Surgery

## 2023-12-04 DIAGNOSIS — M2392 Unspecified internal derangement of left knee: Secondary | ICD-10-CM

## 2023-12-04 DIAGNOSIS — M2352 Chronic instability of knee, left knee: Secondary | ICD-10-CM

## 2023-12-04 DIAGNOSIS — M25562 Pain in left knee: Secondary | ICD-10-CM

## 2023-12-04 DIAGNOSIS — Z87828 Personal history of other (healed) physical injury and trauma: Secondary | ICD-10-CM

## 2024-01-14 ENCOUNTER — Ambulatory Visit
Admission: RE | Admit: 2024-01-14 | Discharge: 2024-01-14 | Disposition: A | Discharge status: Home / Self Care | Source: Ambulatory Visit | Attending: Obstetrics and Gynecology | Admitting: Obstetrics and Gynecology

## 2024-01-14 ENCOUNTER — Other Ambulatory Visit: Payer: Self-pay | Admitting: Obstetrics and Gynecology

## 2024-01-14 DIAGNOSIS — R1011 Right upper quadrant pain: Secondary | ICD-10-CM

## 2024-01-14 MED ORDER — IOPAMIDOL (ISOVUE-300) INJECTION 61%
100.0000 mL | Freq: Once | INTRAVENOUS | Status: AC | PRN
  Administered 2024-01-14: 100 mL via INTRAVENOUS

## 2024-02-09 ENCOUNTER — Other Ambulatory Visit: Payer: Self-pay | Admitting: Physical Medicine & Rehabilitation

## 2024-02-09 DIAGNOSIS — M5414 Radiculopathy, thoracic region: Secondary | ICD-10-CM

## 2024-02-10 ENCOUNTER — Ambulatory Visit
Admission: RE | Admit: 2024-02-10 | Discharge: 2024-02-10 | Disposition: A | Discharge status: Home / Self Care | Source: Ambulatory Visit | Attending: Physical Medicine & Rehabilitation | Admitting: Physical Medicine & Rehabilitation

## 2024-02-10 DIAGNOSIS — M5414 Radiculopathy, thoracic region: Secondary | ICD-10-CM

## 2024-04-18 NOTE — Procedures (Signed)
 Operative Note   SURGERY DATE: 04/18/2024  PRE-OP DIAGNOSIS: Endometriosis [N80.9]    POST-OP DIAGNOSIS: Post-Op Diagnosis Codes:    * Endometriosis [N80.9]  Procedure(s): Exam under anesthesia Diagnostic laparoscopy Excision and Fulguration of endometriosis Vaginal cuff (paracervical) block TAP blocks  SURGEON: Surgeons and Role:    * Arlander Oneil Mt, MD - Primary    * Nicholes Marit Caldron, MD - Resident - Assisting  STAFF: Circulator: Maryellen Iris Sprung, RN; Fonda Maxwell, RN Scrub Person: Olla Brinks; Tatiana Art, RN  ANESTHESIA: General  INDICATION(S): 32 y/o [id number] female with chronic pelvic pain secondary to endometriosis s/p TLH/BSO and multiple laparoscopies who presents for surgical management of pain flare predominantly in RUQ and the vaginal cuff region.   OPERATIVE FINDINGS:  Exam under anesthesia: Normal external genitalia. Exam under anesthesia revealed atrophic vaginal mucosa and an intact vaginal cuff. Laparoscopy: There were no significant adhesions in the pelvis. Minimal free fluid in pelvis. No hemoperitoneum was noted upon entry. The liver had a 5-21mm gun-powder lesion consistent with possible endometriosis (fulgurated); the right flank up by the liver had two areas of fibrotic peritoneum with lesions consistent with possible endometriosis (resected). Liver surface and diagram and RUQ surface dotted with fatty deposits. One was sampled. The uterus, cervix, bilateral fallopian tubes, and bilateral ovaries were surgically absent. There were hemosiderin deposits and gun-powder lesions on and surrounding a thin vaginal cuff. Resected laterally to cuff. To preserve vaginal length, it was fulgurated on the vaginal cuff itself.   OPERATIVE REPORT:  After informed consent was obtained, the patient was taken to the operating room where anesthesia was obtained without difficulty. The patient was positioned in the dorsal lithotomy position in Argenta  stirrups. The arms were carefully padded and tucked at the sides with the hands in neutral positions. Time-out was performed. The patient was examined under anesthesia, and the above findings were noted. She was prepped and draped in normal sterile fashion. A foley catheter was placed.    We mixed 80 mg of kenalog with 20 mL of Exparel , 30 mL of 0.25% Marcaine , and 50 mL of injectable saline.     The speculum was inserted and a block at the vaginal cuff was performed by injecting 20 mL of the above solution on the lateral edges of the cuff.   Gloves were changed and attention was turned to the abdomen. A 5-mm skin incision was made supraumbilically the abdomen was entered using the OPTIVIEW trocar. The laparoscope was introduced and CO2 gas was infused for pneumoperitoneum to a pressure of 15 mm Hg. LUQ and RLQ 5-mm ports were placed under direct visualization.    A systematic inspection of the abdominal and pelvic structures was then undertaken with the above noted findings. Attention was focused on the RUQ and the predominant site of the patient's pain. The gun-powder lesion on the liver was fulgurated with electrocautery. The right flank lesions by the liver were resected using the L-Hook and [place]  graspers. Tissel hemostatic agent was applied diffusely to the dissection beds to aid in hemostasis.   Next, the vaginal cuff was explored as the second predominant site of the patient's pain. The cuff was well approximated with scar tissue present. Presumed areas of endometriosis on the lateral edges of the vaginal cuff were resected using the L-Hook and [place]  graspers. Additional gun-powder lesions scattered across the vaginal cuff were fulgurated using electrocautery to preserve vaginal length.     TAP blocks were then done by sequentially injection the above solution  subperitoneally along and lateral to the junction of the rectus-transverse abdominal muscles on the right side. This was repeated on  the left side.   The ports were removed under direct visualization. The gas was manually expressed. The skin of all of our incisions were closed with subcuticular sutures of 4-0 Biosyn. Dermabond was applied. The foley catheter was removed.   Sponge and instrument counts taken at this time coincided with the preoperative count. The patient tolerated the procedure well and was transferred to the recovery room in satisfactory condition.  ESTIMATED BLOOD LOSS: 10 mL   TOTAL IV FLUIDS: Crystalloid 700 mL  URINE OUTPUT: 400 mL  SPECIMENS:  ID Type Source Tests Collected by Time Destination  1 : Right Upper Abdomen Tissue-Pathology Tissue PATHOLOGY - GENERAL / OTHER Arlander Oneil Mt, MD 04/18/2024 1551   2 : Right Upper Flank #1 Tissue-Pathology Tissue PATHOLOGY - GENERAL / OTHER Arlander Oneil Mt, MD 04/18/2024 1551   3 : Right Upper Flank #2 Tissue-Pathology Tissue PATHOLOGY - GENERAL / OTHER Arlander Oneil Mt, MD 04/18/2024 1552   4 : Right Vaginal Cuff Lesion Tissue-Pathology Tissue PATHOLOGY - GENERAL / OTHER Arlander Oneil Mt, MD 04/18/2024 1604   5 : left Vaginal Cuff Tissue-Pathology Tissue PATHOLOGY - GENERAL / OTHER Arlander Oneil Mt, MD 04/18/2024 1610     IMPLANTS:  Implant Name Type Inv. Item Serial No. Manufacturer Lot No. LRB No. Used Action  SYRINGE, PRE-FILLED PRIMA TISSEEL - SNA  SYRINGE, PRE-FILLED PRIMA TISSEEL NA [employer] [id number] N/A 1 Implanted    COMPLICATIONS: None  DISPOSITION: PACU - hemodynamically stable.  Isabel A. Nicholes, MD Obstetrics and Gynecology, PGY-3 [facility]  ATTESTATION:  FR- Surgery - (Entire) - I was present throughout the entire procedure.  I have reviewed and agree with the procedural note as documented by the resident (FR). Oneil Arlander, MD/MPH

## 2024-05-14 ENCOUNTER — Emergency Department: Admission: EM | Admit: 2024-05-14 | Discharge: 2024-05-14 | Disposition: A | Discharge status: Home / Self Care

## 2024-05-14 ENCOUNTER — Other Ambulatory Visit: Payer: Self-pay

## 2024-05-14 ENCOUNTER — Emergency Department

## 2024-05-14 DIAGNOSIS — R300 Dysuria: Secondary | ICD-10-CM | POA: Insufficient documentation

## 2024-05-14 DIAGNOSIS — R110 Nausea: Secondary | ICD-10-CM | POA: Insufficient documentation

## 2024-05-14 DIAGNOSIS — R1084 Generalized abdominal pain: Secondary | ICD-10-CM | POA: Insufficient documentation

## 2024-05-14 LAB — URINALYSIS, ROUTINE W REFLEX MICROSCOPIC
Bilirubin Urine: NEGATIVE
Glucose, UA: NEGATIVE mg/dL
Hgb urine dipstick: NEGATIVE
Ketones, ur: NEGATIVE mg/dL
Leukocytes,Ua: NEGATIVE
Nitrite: NEGATIVE
Protein, ur: NEGATIVE mg/dL
Specific Gravity, Urine: 1.03 (ref 1.005–1.030)
pH: 5 (ref 5.0–8.0)

## 2024-05-14 LAB — COMPREHENSIVE METABOLIC PANEL WITH GFR
ALT: 90 U/L — ABNORMAL HIGH (ref 0–44)
AST: 50 U/L — ABNORMAL HIGH (ref 15–41)
Albumin: 4.6 g/dL (ref 3.5–5.0)
Alkaline Phosphatase: 62 U/L (ref 38–126)
Anion gap: 13 (ref 5–15)
BUN: 15 mg/dL (ref 6–20)
CO2: 23 mmol/L (ref 22–32)
Calcium: 9.4 mg/dL (ref 8.9–10.3)
Chloride: 105 mmol/L (ref 98–111)
Creatinine, Ser: 0.62 mg/dL (ref 0.44–1.00)
GFR, Estimated: >60 mL/min (ref >60)
Glucose, Bld: 123 mg/dL — ABNORMAL HIGH (ref 70–99)
Potassium: 3.9 mmol/L (ref 3.5–5.1)
Sodium: 141 mmol/L (ref 135–145)
Total Bilirubin: 0.3 mg/dL (ref 0.0–1.2)
Total Protein: 6.7 g/dL (ref 6.5–8.1)

## 2024-05-14 LAB — CBC
HCT: 39.5 % (ref 36.0–46.0)
Hemoglobin: 13.4 g/dL (ref 12.0–15.0)
MCH: 29.6 pg (ref 26.0–34.0)
MCHC: 33.9 g/dL (ref 30.0–36.0)
MCV: 87.4 fL (ref 80.0–100.0)
Platelets: 262 K/uL (ref 150–400)
RBC: 4.52 MIL/uL (ref 3.87–5.11)
RDW: 13.1 % (ref 11.5–15.5)
WBC: 5.7 K/uL (ref 4.0–10.5)
nRBC: 0 % (ref 0.0–0.2)

## 2024-05-14 LAB — LIPASE, BLOOD: Lipase: 67 U/L — ABNORMAL HIGH (ref 11–51)

## 2024-05-14 MED ORDER — MORPHINE SULFATE (PF) 4 MG/ML IV SOLN
4.0000 mg | Freq: Once | INTRAVENOUS | Status: AC
  Administered 2024-05-14: 4 mg via INTRAVENOUS
  Filled 2024-05-14: qty 1

## 2024-05-14 MED ORDER — IOHEXOL 300 MG/ML  SOLN
100.0000 mL | Freq: Once | INTRAMUSCULAR | Status: AC | PRN
  Administered 2024-05-14: 100 mL via INTRAVENOUS

## 2024-05-14 NOTE — Discharge Instructions (Signed)
 You were seen today due to concern of abdominal pain.  Fortunately at this time your labs and imaging are reassuring.  I would recommend following up with your gynecology team in the outpatient setting as you have planned.  If you have any worsening of symptoms such as increased abdominal pain, fevers, vaginal discharge or discoloration, or any other symptoms you find concerning please return to the emergency department immediately for further medical management.

## 2024-05-14 NOTE — ED Triage Notes (Signed)
 Surgery 3 weeks ago to remove endometriosis from vagina and liver.  ARrives with worsening abd pain. Seen through [facility] and referred to ED for evaluation. C/O generalized abd pain.  AAOx3. Skin warm and dry. NAD

## 2024-05-14 NOTE — ED Provider Notes (Signed)
 [facility] Provider Note    Event Date/Time   First MD Initiated Contact with Patient 05/14/24 2114     (approximate)   History   Abdominal Pain   HPI  Grace Norris is a 33 y.o. female who presents today with concern of generalized abdominal pain.  She has a history of endometriosis, she is about 3 weeks postop from ablation done with [facility].  Since that initially was doing well on Monday she started noticing some burning with urination, yesterday went to urgent care, had urinalysis done which was without acute findings, today all on the commode started having significant abdominal pain causing her to present this time.  Denies any associated vaginal bleeding or discharge.  Some minor nausea but no affiliated vomiting episodes.  Denies associated fevers chills.  Multiple prior abdominal surgical history, apparently yesterday was encouraged to come in by urgent care, but the patient held off at that time.     Physical Exam   Triage Vital Signs: ED Triage Vitals  Encounter Vitals Group     BP 05/14/24 1856 (!) 133/108     Girls Systolic BP Percentile --      Girls Diastolic BP Percentile --      Boys Systolic BP Percentile --      Boys Diastolic BP Percentile --      Pulse Rate 05/14/24 1856 79     Resp 05/14/24 1856 18     Temp 05/14/24 1856 97.9 F (36.6 C)     Temp Source 05/14/24 1856 Axillary     SpO2 05/14/24 1856 99 %     Weight 05/14/24 1858 151 lb 0.2 oz (68.5 kg)     Height --      Head Circumference --      Peak Flow --      Pain Score 05/14/24 1857 10     Pain Loc --      Pain Education --      Exclude from Growth Chart --     Most recent vital signs: Vitals:   05/14/24 2130 05/14/24 2230  BP: 133/75 121/68  Pulse: 85 95  Resp:  16  Temp:  98.2 F (36.8 C)  SpO2: 100% 100%     General: Awake, no distress.  CV:  Good peripheral perfusion.  Resp:  Normal effort.  Abd:  No distention.  Soft, she started  screaming that soon as I touch her abdomen, but I am able to perform deep palpation without noted rebound guarding rigidity Other:     ED Results / Procedures / Treatments   Labs (all labs ordered are listed, but only abnormal results are displayed) Labs Reviewed  LIPASE, BLOOD - Abnormal; Notable for the following components:      Result Value   Lipase 67 (*)    All other components within normal limits  COMPREHENSIVE METABOLIC PANEL WITH GFR - Abnormal; Notable for the following components:   Glucose, Bld 123 (*)    AST 50 (*)    ALT 90 (*)    All other components within normal limits  URINALYSIS, ROUTINE W REFLEX MICROSCOPIC - Abnormal; Notable for the following components:   Color, Urine YELLOW (*)    APPearance CLEAR (*)    All other components within normal limits  CBC     EKG     RADIOLOGY   PROCEDURES:  Critical Care performed: No  Procedures   MEDICATIONS ORDERED IN ED: Medications  morphine  (PF)  4 MG/ML injection 4 mg (4 mg Intravenous Given 05/14/24 2136)  iohexol  (OMNIPAQUE ) 300 MG/ML solution 100 mL (100 mLs Intravenous Contrast Given 05/14/24 2209)  morphine  (PF) 4 MG/ML injection 4 mg (4 mg Intravenous Given 05/14/24 2248)     IMPRESSION / MDM / ASSESSMENT AND PLAN / ED COURSE  I reviewed the triage vital signs and the nursing notes.                               Patient's presentation is most consistent with acute presentation with potential threat to life or bodily function.  35 weeks postop from gynecologic procedure.  At rest she appears well she is not in acute distress, she is able to speak in full sentences, vitals here are reassuring.  I reviewed her chart, she does have a history of multiple chronic abdominal issues but also given the recent surgery, believe reasonable to obtain imaging to further assess.  She does not clinically have any signs or symptoms of systemic infection, her surgical site appears clean.  Will go ahead and  follow-up imaging and reach out to [facility] gynecology to discuss further management and recommendations.   Clinical Course as of 05/15/24 0005  Sun May 14, 2024  2235 Spoke with Dr. Atilano from gynecology at [facility], did not feel further work up was warranted.  I spoke with the patient, comfortable with discharge home at this time.  I discussed return precautions, she verbalized understanding plans to follow-up with her scheduled appointment on Tuesday. [SK]    Clinical Course User Index [SK] Fernand Rossie HERO, MD     FINAL CLINICAL IMPRESSION(S) / ED DIAGNOSES   Final diagnoses:  Generalized abdominal pain     Rx / DC Orders   ED Discharge Orders     None        Note:  This document was prepared using Dragon voice recognition software and may include unintentional dictation errors.   Fernand Rossie HERO, MD 05/15/24 MIKI

## 2024-05-31 ENCOUNTER — Other Ambulatory Visit: Payer: Self-pay | Admitting: Orthopedic Surgery

## 2024-05-31 DIAGNOSIS — M7521 Bicipital tendinitis, right shoulder: Secondary | ICD-10-CM

## 2024-06-03 ENCOUNTER — Ambulatory Visit
Admission: RE | Admit: 2024-06-03 | Discharge: 2024-06-03 | Disposition: A | Discharge status: Home / Self Care | Source: Ambulatory Visit | Attending: Orthopedic Surgery | Admitting: Orthopedic Surgery

## 2024-06-03 DIAGNOSIS — M7521 Bicipital tendinitis, right shoulder: Secondary | ICD-10-CM | POA: Diagnosis present
